# Patient Record
Sex: Female | Born: 1980
Health system: Southern US, Community
[De-identification: ages and names within clinical notes are randomized; demographics above are authoritative.]

## PROBLEM LIST (undated history)

## (undated) ENCOUNTER — Inpatient Hospital Stay (HOSPITAL_COMMUNITY): Payer: Medicaid Other

## (undated) DIAGNOSIS — N883 Incompetence of cervix uteri: Secondary | ICD-10-CM

## (undated) DIAGNOSIS — I82409 Acute embolism and thrombosis of unspecified deep veins of unspecified lower extremity: Secondary | ICD-10-CM

## (undated) DIAGNOSIS — F419 Anxiety disorder, unspecified: Secondary | ICD-10-CM

## (undated) DIAGNOSIS — F329 Major depressive disorder, single episode, unspecified: Secondary | ICD-10-CM

## (undated) DIAGNOSIS — F32A Depression, unspecified: Secondary | ICD-10-CM

## (undated) HISTORY — PX: ABDOMINAL SURGERY: SHX537

## (undated) HISTORY — DX: Incompetence of cervix uteri: N88.3

---

## 2008-07-20 ENCOUNTER — Observation Stay (HOSPITAL_COMMUNITY): Admission: AD | Admit: 2008-07-20 | Discharge: 2008-07-20 | Payer: Self-pay | Admitting: Family Medicine

## 2008-07-20 ENCOUNTER — Encounter: Payer: Self-pay | Admitting: Family Medicine

## 2008-07-20 ENCOUNTER — Ambulatory Visit: Payer: Self-pay | Admitting: Family Medicine

## 2008-08-12 ENCOUNTER — Ambulatory Visit: Payer: Self-pay | Admitting: Obstetrics & Gynecology

## 2008-09-08 ENCOUNTER — Ambulatory Visit: Payer: Self-pay | Admitting: Obstetrics and Gynecology

## 2009-04-15 ENCOUNTER — Emergency Department (HOSPITAL_COMMUNITY): Admission: EM | Admit: 2009-04-15 | Discharge: 2009-04-15 | Payer: Self-pay | Admitting: Family Medicine

## 2009-06-08 ENCOUNTER — Inpatient Hospital Stay (HOSPITAL_COMMUNITY): Admission: AD | Admit: 2009-06-08 | Discharge: 2009-06-08 | Payer: Self-pay | Admitting: Obstetrics & Gynecology

## 2009-07-06 ENCOUNTER — Inpatient Hospital Stay (HOSPITAL_COMMUNITY): Admission: AD | Admit: 2009-07-06 | Discharge: 2009-07-07 | Payer: Self-pay | Admitting: Obstetrics & Gynecology

## 2009-07-08 ENCOUNTER — Inpatient Hospital Stay (HOSPITAL_COMMUNITY): Admission: AD | Admit: 2009-07-08 | Discharge: 2009-07-08 | Payer: Self-pay | Admitting: Family Medicine

## 2009-10-29 DIAGNOSIS — N883 Incompetence of cervix uteri: Secondary | ICD-10-CM | POA: Insufficient documentation

## 2009-10-29 DIAGNOSIS — O3430 Maternal care for cervical incompetence, unspecified trimester: Secondary | ICD-10-CM | POA: Insufficient documentation

## 2009-10-29 HISTORY — DX: Incompetence of cervix uteri: N88.3

## 2009-12-27 ENCOUNTER — Emergency Department (HOSPITAL_COMMUNITY): Admission: EM | Admit: 2009-12-27 | Discharge: 2009-12-27 | Payer: Self-pay | Admitting: Family Medicine

## 2010-01-10 ENCOUNTER — Emergency Department (HOSPITAL_COMMUNITY): Admission: EM | Admit: 2010-01-10 | Discharge: 2010-01-10 | Payer: Self-pay | Admitting: Family Medicine

## 2010-03-31 ENCOUNTER — Emergency Department (HOSPITAL_COMMUNITY): Admission: EM | Admit: 2010-03-31 | Discharge: 2010-03-31 | Payer: Self-pay | Admitting: Family Medicine

## 2010-05-03 ENCOUNTER — Emergency Department (HOSPITAL_COMMUNITY): Admission: EM | Admit: 2010-05-03 | Discharge: 2010-05-03 | Payer: Self-pay | Admitting: Family Medicine

## 2010-07-19 ENCOUNTER — Inpatient Hospital Stay (HOSPITAL_COMMUNITY): Admission: AD | Admit: 2010-07-19 | Discharge: 2010-07-19 | Payer: Self-pay | Admitting: Obstetrics and Gynecology

## 2010-07-25 ENCOUNTER — Inpatient Hospital Stay (HOSPITAL_COMMUNITY): Admission: AD | Admit: 2010-07-25 | Discharge: 2010-07-26 | Payer: Self-pay | Admitting: Obstetrics & Gynecology

## 2010-07-25 ENCOUNTER — Ambulatory Visit: Payer: Self-pay | Admitting: Family Medicine

## 2010-09-20 ENCOUNTER — Encounter: Payer: Self-pay | Admitting: Family Medicine

## 2010-09-20 ENCOUNTER — Ambulatory Visit: Payer: Self-pay | Admitting: Family Medicine

## 2010-09-20 LAB — CONVERTED CEMR LAB
Antibody Screen: NEGATIVE
Basophils Absolute: 0 10*3/uL (ref 0.0–0.1)
Basophils Relative: 0 % (ref 0–1)
Eosinophils Absolute: 0.1 10*3/uL (ref 0.0–0.7)
Eosinophils Relative: 1 % (ref 0–5)
HCT: 35 % — ABNORMAL LOW (ref 36.0–46.0)
HIV: NONREACTIVE
Hemoglobin: 11.7 g/dL — ABNORMAL LOW (ref 12.0–15.0)
Hepatitis B Surface Ag: NEGATIVE
Lymphocytes Relative: 15 % (ref 12–46)
Lymphs Abs: 1.5 10*3/uL (ref 0.7–4.0)
MCHC: 33.4 g/dL (ref 30.0–36.0)
MCV: 92.3 fL (ref 78.0–100.0)
Monocytes Absolute: 0.4 10*3/uL (ref 0.1–1.0)
Monocytes Relative: 4 % (ref 3–12)
Neutro Abs: 8.3 10*3/uL — ABNORMAL HIGH (ref 1.7–7.7)
Neutrophils Relative %: 80 % — ABNORMAL HIGH (ref 43–77)
Platelets: 224 10*3/uL (ref 150–400)
RBC: 3.79 M/uL — ABNORMAL LOW (ref 3.87–5.11)
RDW: 13.6 % (ref 11.5–15.5)
Rh Type: POSITIVE
Rubella: 77.5 intl units/mL — ABNORMAL HIGH
Sickle Cell Screen: NEGATIVE
WBC: 10.3 10*3/uL (ref 4.0–10.5)

## 2010-09-28 ENCOUNTER — Encounter: Payer: Self-pay | Admitting: Family Medicine

## 2010-09-28 ENCOUNTER — Ambulatory Visit: Payer: Self-pay | Admitting: Family Medicine

## 2010-09-28 DIAGNOSIS — N898 Other specified noninflammatory disorders of vagina: Secondary | ICD-10-CM | POA: Insufficient documentation

## 2010-09-28 LAB — CONVERTED CEMR LAB
Chlamydia, DNA Probe: NEGATIVE
GC Probe Amp, Genital: NEGATIVE
Pap Smear: NEGATIVE

## 2010-10-04 ENCOUNTER — Telehealth: Payer: Self-pay | Admitting: *Deleted

## 2010-10-12 ENCOUNTER — Ambulatory Visit: Payer: Self-pay | Admitting: Obstetrics & Gynecology

## 2010-10-16 ENCOUNTER — Inpatient Hospital Stay (HOSPITAL_COMMUNITY)
Admission: RE | Admit: 2010-10-16 | Discharge: 2010-10-17 | Payer: Self-pay | Source: Home / Self Care | Attending: Family Medicine | Admitting: Family Medicine

## 2010-10-16 HISTORY — PX: CERVICAL CERCLAGE: SHX1329

## 2010-10-17 ENCOUNTER — Ambulatory Visit (HOSPITAL_COMMUNITY)
Admission: RE | Admit: 2010-10-17 | Discharge: 2010-10-17 | Payer: Self-pay | Source: Home / Self Care | Attending: Family Medicine | Admitting: Family Medicine

## 2010-10-26 ENCOUNTER — Ambulatory Visit: Payer: Self-pay | Admitting: Obstetrics & Gynecology

## 2010-11-02 ENCOUNTER — Ambulatory Visit
Admission: RE | Admit: 2010-11-02 | Discharge: 2010-11-02 | Payer: Self-pay | Source: Home / Self Care | Attending: Obstetrics and Gynecology | Admitting: Obstetrics and Gynecology

## 2010-11-02 LAB — POCT URINALYSIS DIPSTICK
Bilirubin Urine: NEGATIVE
Hemoglobin, Urine: NEGATIVE
Ketones, ur: NEGATIVE mg/dL
Nitrite: NEGATIVE
Protein, ur: NEGATIVE mg/dL
Specific Gravity, Urine: 1.02 (ref 1.005–1.030)
Urine Glucose, Fasting: NEGATIVE mg/dL
Urobilinogen, UA: 0.2 mg/dL (ref 0.0–1.0)
pH: 6 (ref 5.0–8.0)

## 2010-11-06 ENCOUNTER — Ambulatory Visit (HOSPITAL_COMMUNITY)
Admission: RE | Admit: 2010-11-06 | Discharge: 2010-11-06 | Payer: Self-pay | Source: Home / Self Care | Attending: Obstetrics & Gynecology | Admitting: Obstetrics & Gynecology

## 2010-11-09 ENCOUNTER — Ambulatory Visit: Admit: 2010-11-09 | Payer: Self-pay | Admitting: Obstetrics & Gynecology

## 2010-11-16 ENCOUNTER — Ambulatory Visit
Admission: RE | Admit: 2010-11-16 | Discharge: 2010-11-16 | Payer: Self-pay | Source: Home / Self Care | Attending: Family Medicine | Admitting: Family Medicine

## 2010-11-17 ENCOUNTER — Encounter: Payer: Self-pay | Admitting: Obstetrics & Gynecology

## 2010-11-17 LAB — CONVERTED CEMR LAB
Clue Cells Wet Prep HPF POC: NONE SEEN
Trich, Wet Prep: NONE SEEN
Yeast Wet Prep HPF POC: NONE SEEN

## 2010-11-18 ENCOUNTER — Other Ambulatory Visit: Payer: Self-pay | Admitting: Obstetrics & Gynecology

## 2010-11-18 DIAGNOSIS — O26879 Cervical shortening, unspecified trimester: Secondary | ICD-10-CM

## 2010-11-19 ENCOUNTER — Encounter: Payer: Self-pay | Admitting: Obstetrics & Gynecology

## 2010-11-20 LAB — POCT URINALYSIS DIPSTICK
Bilirubin Urine: NEGATIVE
Hgb urine dipstick: NEGATIVE
Ketones, ur: NEGATIVE mg/dL
Nitrite: NEGATIVE
Protein, ur: NEGATIVE mg/dL
Specific Gravity, Urine: 1.02 (ref 1.005–1.030)
Urine Glucose, Fasting: NEGATIVE mg/dL
Urobilinogen, UA: 0.2 mg/dL (ref 0.0–1.0)
pH: 7 (ref 5.0–8.0)

## 2010-11-28 NOTE — Progress Notes (Signed)
Summary: re: appt at [facility]  Phone Note Other Incoming   Caller: Andreas Blower interpreter Summary of Call: Pt still wating for appt with [facility].  Interpreter calling to find out when that will be.  Please call her at [phone] lv msg if not in. Initial call taken by: Abundio Miu,  October 04, 2010 2:25 PM  Follow-up for Phone Call        called Geanie Cooley and lmvm  PT'S APPT AT [facility] IS 10-12-10 AT 10AM.  Follow-up by: Arlyss Repress CMA,,  October 05, 2010 5:30 PM

## 2010-11-30 ENCOUNTER — Other Ambulatory Visit (HOSPITAL_COMMUNITY): Payer: Self-pay | Admitting: Maternal and Fetal Medicine

## 2010-11-30 ENCOUNTER — Ambulatory Visit: Admit: 2010-11-30 | Payer: Self-pay | Admitting: Obstetrics & Gynecology

## 2010-11-30 ENCOUNTER — Other Ambulatory Visit: Payer: Self-pay | Admitting: Obstetrics & Gynecology

## 2010-11-30 ENCOUNTER — Ambulatory Visit (HOSPITAL_COMMUNITY)
Admission: RE | Admit: 2010-11-30 | Discharge: 2010-11-30 | Disposition: A | Discharge status: Home / Self Care | Payer: Self-pay | Source: Ambulatory Visit | Attending: Obstetrics & Gynecology | Admitting: Obstetrics & Gynecology

## 2010-11-30 ENCOUNTER — Other Ambulatory Visit: Payer: Self-pay

## 2010-11-30 DIAGNOSIS — O26879 Cervical shortening, unspecified trimester: Secondary | ICD-10-CM

## 2010-11-30 DIAGNOSIS — O3430 Maternal care for cervical incompetence, unspecified trimester: Secondary | ICD-10-CM | POA: Insufficient documentation

## 2010-11-30 DIAGNOSIS — O09299 Supervision of pregnancy with other poor reproductive or obstetric history, unspecified trimester: Secondary | ICD-10-CM | POA: Insufficient documentation

## 2010-11-30 NOTE — Assessment & Plan Note (Signed)
Summary: NOB/AAM/DSL   Vital Signs:  Patient profile:   30 year old female Weight:      165 pounds BP sitting:   132 / 68  History of Present Illness: G3P0110 @ 17.2 w/ EDC 03/06/11 new OB visit.  Complaints:  HAs since pregnancy started. Greenish d/c last week Nausea, but vomiting has resolved ?thin discharge, white-ish in color (has h/o IUFD w/ SROM and SAB) NO vag bleeding +SOB with laying, walking, sitting  **Pt has h/o 2nd trimester IUFD and 1st trimester SAB**  Everything was done at [facility].  Summary:  07/20/08: IUFD at 15.5, nml BP at that time (128/83), had p/w vaginal bleeding and umbilical cord was prolapsed through os; pathology showed partial abruption; had h/o LOF x15days before the bleeding.  06/08/09: MAU for abd pain, yolk sac visualized on U/S,  ~5.0 weeks  07/07/09: MAU for vag bleeding, U/S showed yolk sac, couldn't visualize embryo (double blebs sign) or cardiac activity,  ~6.2; Bhcg: 15,098  07/08/09: MAU for repeat quant Bhcg: 15, 017.  Pt diagnosed with completed miscarriage and elected for cytotec over waiting.   Current Problems (verified): 1)  Leukorrhea  (ICD-623.5) 2)  Screening For Malignant Neoplasm of The Cervix  (ICD-V76.2) 3)  Supervision of Normal First Pregnancy  (ICD-V22.0)  Current Medications (verified): 1)  None  Allergies (verified): No Known Drug Allergies  Social History: Occupation:  had been working at [employer]:  some high school Hepatitis Risk:  no Packs/Day:  n/a  Physical Exam  General:  alert, well-developed, well-nourished, and well-hydrated.   Lungs:  normal respiratory effort, normal breath sounds, no crackles, and no wheezes.   Heart:  normal rate, regular rhythm, and no murmur.   Abdomen:  soft and normal bowel sounds.   Genitalia:  no external lesions, mucosa pink and moist, no friaility or hemorrhage, no adnexal masses or tenderness; no vaginal bleeding; thick, white vaginal discharge.     Impression & Recommendations:  Problem # 1:  PREGNANCY, HIGH RISK (ICD-V23.9)  D/w Dr. Mauricio Po.  Will likely need to be transferred to [facility] given the h/o 2nd trimester IUFD followed by SAB @6  weeks.   FHTs were reassuring (147-150). No vaginal bleeding. No pooling of fluid on spec exam. Os appeared closed. White vaginal discharge.  GC/Chlam, Pap, Wet prep done. PNLs WNL.   Orders: Other OB visit- [facility])  Other Orders: GC/Chlamydia-[facility] ([id number]) Pap Smear-[facility] [phone]) Wet Prep- [facility] [phone])   Orders Added: 1)  GC/Chlamydia-[facility] [[id number]] 2)  Pap Smear-[facility] [[id number]] 3)  Wet Prep- [facility] [[id number]] 4)  Other OB visit- [facility] [[facility]]    pt phone # [phone]  Flowsheet View for Follow-up Visit    Estimated weeks of       gestation:     17 2/7    Weight:     165    Blood pressure:   132 / 68    Hx headache?     few    Nausea/vomiting?   nausea    Edema?     0    Bleeding?     no    Leakage/discharge?   d/c    Fetal activity:       N/A    Labor symptoms?   no    FHR:       147    Fetal position:      N/A    Cx dilation:     0    Cx effacement:  0%    Fetal station:     high    Taking Vitamins?   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    Number of children at home: 0  FOB Information    Husband/Father of baby: Wynona Canes     FOB occupation [employer]    FOB Comments: Very excited, trying to get pregnant   Menstrual History    Best Working [facility]: 03/06/2011    Symptoms since LMP: nausea, vomiting, fatigue, irritability, tender breasts    Other symptoms: WHOG b/c of nausea/vomiting, couldn't keep anything down in 1st 3 months, nausea only  now; having bad headache since beginning of pregnancy but not every day ( ~1-2x/week), very intense, last  one yesterday, blurry vision, takes Tylenol, worse with laying down; behind and over eyes and top of head; SOB with laying down; had SOB while cleaning and got dizzy and sat down for a little; SOB with walking and talking sometimes  Prenatal Visit    FOB name: Wynona Canes  [facility] Confirmation:    New working [facility]: 03/06/2011   Past Pregnancy History    Gravida:     3    Term Births:     0    Premature Births:   1    Living Children:   0    Para:       0    Prev C-Section:   0    Aborta:     1    Elect. Ab:     0    Spont. Ab:     1  Pregnancy # 1    Delivery date:     07/20/2008    Weeks Gestation:   15.5    Preterm labor:     yes    Delivery type:     NSVD    Infant Sex:     Female    Comments:     Preterm, 2nd trimester IUFD @ 15.5  Pregnancy # 2    Delivery date:     07/07/2009    Weeks Gestation:   6.2    Preterm labor:     no    Delivery type:     NSVD    Comments:     SAB  Pregnancy # 3    Comments:     Current   Genetic History    Father of baby:   Wynona Canes      Thalassemia:     mother: no   father: no    Neural tube defect:   mother: no   father: no    Down's Syndrome:   mother: no   father: no    Tay-Sachs:     mother: no   father: no    Sickle Cell Dz/Trait:   mother: no   father: no    Hemophilia:     mother: no   father: no    Muscular Dystrophy:   mother: no   father: no    Cystic Fibrosis:   mother: no   father: no    Huntington's Dz:   mother: no   father: no    Mental Retardation:   mother: no   father: no  Fragile X:     mother: no   father: no    Other Genetic or       Chromosomal Dz:   mother: no   father: no    Child with other       birth defect:     mother: no   father: no    > 3 spont. abortions:   mother: no    Hx of stillbirth:     mother: yes  Infection Risk History    High Risk Hepatitis B: no    Immunized against Hepatitis B: no    Exposure to TB: no    Patient with history of Genital Herpes: no    Sexual partner with history of  Genital Herpes: no    History of STD (GC, Chlamydia, Syphilis, HPV): no    Rash, Viral, or Febrile Illness since LMP: no    Exposure to Cat Litter: no    Chicken Pox Immune Status: Non-immune    History of Parvovirus (Fifth Disease): no    Occupational Exposure to Children: none  Environmental Exposures    Xray Exposure since LMP: no    Chemical or other exposure: no    Medication, drug, or alcohol use since LMP: no   Flowsheet View for Follow-up Visit    Estimated weeks of       gestation:     17 2/7    Weight:     165    Blood pressure:   132 / 68    Headache:     few    Nausea/vomiting:   nausea    Edema:     0    Vaginal bleeding:   no    Vaginal discharge:   d/c    FHR:       147    Fetal activity:     N/A    Labor symptoms:   no    Fetal position:     N/A    Cx Dilation:     0    Cx Effacement:   0%    Cx Station:     high    Taking prenatal vits?   Y    Smoking:     n/a   Laboratory Results  Date/Time Received: September 28, 2010 2:47 PM  Date/Time Reported: September 28, 2010 2:53 PM   [employer] Source: vaginal WBC/hpf: 10-20 Bacteria/hpf: 3+  Rods Clue cells/hpf: none  Negative whiff Yeast/hpf: none Trichomonas/hpf: none Comments: 5-10 RBC's also present  ...........test performed by...........Marland KitchenTerese Door, CMA     Appended Document: NOB/AAM/DSL Pt seen at high risk and cerclage placed for h/o cervical insufficency with second trimester loss and current short cervix.  Pt should be followed at high risk.

## 2010-12-04 LAB — POCT URINALYSIS DIPSTICK
Bilirubin Urine: NEGATIVE
Hgb urine dipstick: NEGATIVE
Ketones, ur: NEGATIVE mg/dL
Nitrite: NEGATIVE
Protein, ur: NEGATIVE mg/dL
Specific Gravity, Urine: 1.015 (ref 1.005–1.030)
Urine Glucose, Fasting: NEGATIVE mg/dL
Urobilinogen, UA: 0.2 mg/dL (ref 0.0–1.0)
pH: 6.5 (ref 5.0–8.0)

## 2010-12-14 ENCOUNTER — Other Ambulatory Visit: Payer: Self-pay

## 2010-12-14 ENCOUNTER — Encounter (INDEPENDENT_AMBULATORY_CARE_PROVIDER_SITE_OTHER): Payer: Self-pay | Admitting: *Deleted

## 2010-12-14 DIAGNOSIS — O3430 Maternal care for cervical incompetence, unspecified trimester: Secondary | ICD-10-CM

## 2010-12-14 LAB — CONVERTED CEMR LAB
HIV: NONREACTIVE
MCHC: 33.7 g/dL (ref 30.0–36.0)
Platelets: 188 10*3/uL (ref 150–400)
RDW: 13.5 % (ref 11.5–15.5)

## 2010-12-14 LAB — POCT URINALYSIS DIPSTICK
Bilirubin Urine: NEGATIVE
Hgb urine dipstick: NEGATIVE
Ketones, ur: NEGATIVE mg/dL
Nitrite: NEGATIVE
Protein, ur: NEGATIVE mg/dL
Specific Gravity, Urine: 1.02 (ref 1.005–1.030)
Urine Glucose, Fasting: NEGATIVE mg/dL
Urobilinogen, UA: 0.2 mg/dL (ref 0.0–1.0)
pH: 7 (ref 5.0–8.0)

## 2010-12-15 ENCOUNTER — Encounter (HOSPITAL_COMMUNITY): Payer: Self-pay

## 2010-12-15 ENCOUNTER — Ambulatory Visit (HOSPITAL_COMMUNITY)
Admission: RE | Admit: 2010-12-15 | Discharge: 2010-12-15 | Disposition: A | Discharge status: Home / Self Care | Payer: Self-pay | Source: Ambulatory Visit | Attending: Maternal and Fetal Medicine | Admitting: Maternal and Fetal Medicine

## 2010-12-15 DIAGNOSIS — O09299 Supervision of pregnancy with other poor reproductive or obstetric history, unspecified trimester: Secondary | ICD-10-CM

## 2010-12-22 ENCOUNTER — Encounter: Payer: Self-pay | Admitting: Obstetrics & Gynecology

## 2010-12-22 ENCOUNTER — Other Ambulatory Visit: Payer: Self-pay

## 2010-12-22 DIAGNOSIS — Z0189 Encounter for other specified special examinations: Secondary | ICD-10-CM

## 2010-12-28 ENCOUNTER — Other Ambulatory Visit: Payer: Self-pay

## 2010-12-28 DIAGNOSIS — O3430 Maternal care for cervical incompetence, unspecified trimester: Secondary | ICD-10-CM

## 2010-12-28 LAB — POCT URINALYSIS DIPSTICK
Nitrite: NEGATIVE
Protein, ur: NEGATIVE mg/dL
Specific Gravity, Urine: 1.015 (ref 1.005–1.030)
Urine Glucose, Fasting: NEGATIVE mg/dL
Urobilinogen, UA: 0.2 mg/dL (ref 0.0–1.0)

## 2011-01-04 ENCOUNTER — Other Ambulatory Visit: Payer: Self-pay

## 2011-01-04 ENCOUNTER — Ambulatory Visit (HOSPITAL_COMMUNITY)
Admission: RE | Admit: 2011-01-04 | Discharge: 2011-01-04 | Disposition: A | Discharge status: Home / Self Care | Payer: Self-pay | Source: Ambulatory Visit | Attending: Obstetrics & Gynecology | Admitting: Obstetrics & Gynecology

## 2011-01-04 ENCOUNTER — Other Ambulatory Visit: Payer: Self-pay | Admitting: Obstetrics & Gynecology

## 2011-01-04 DIAGNOSIS — O3430 Maternal care for cervical incompetence, unspecified trimester: Secondary | ICD-10-CM | POA: Insufficient documentation

## 2011-01-04 DIAGNOSIS — O368190 Decreased fetal movements, unspecified trimester, not applicable or unspecified: Secondary | ICD-10-CM

## 2011-01-04 DIAGNOSIS — O09299 Supervision of pregnancy with other poor reproductive or obstetric history, unspecified trimester: Secondary | ICD-10-CM

## 2011-01-04 LAB — POCT URINALYSIS DIPSTICK
Glucose, UA: NEGATIVE mg/dL
Hgb urine dipstick: NEGATIVE
Nitrite: NEGATIVE
Urobilinogen, UA: 0.2 mg/dL (ref 0.0–1.0)

## 2011-01-08 LAB — CBC
HCT: 31.4 % — ABNORMAL LOW (ref 36.0–46.0)
MCHC: 35.4 g/dL (ref 30.0–36.0)
MCV: 89.7 fL (ref 78.0–100.0)
RDW: 13 % (ref 11.5–15.5)

## 2011-01-08 LAB — GLUCOSE, CAPILLARY: Glucose-Capillary: 81 mg/dL (ref 70–99)

## 2011-01-08 LAB — POCT URINALYSIS DIPSTICK
Bilirubin Urine: NEGATIVE
Glucose, UA: NEGATIVE mg/dL
Glucose, UA: NEGATIVE mg/dL
Hgb urine dipstick: NEGATIVE
Ketones, ur: NEGATIVE mg/dL
Nitrite: NEGATIVE
Specific Gravity, Urine: 1.02 (ref 1.005–1.030)
Urobilinogen, UA: 0.2 mg/dL (ref 0.0–1.0)
pH: 7 (ref 5.0–8.0)

## 2011-01-08 LAB — URINALYSIS, ROUTINE W REFLEX MICROSCOPIC
Bilirubin Urine: NEGATIVE
Hgb urine dipstick: NEGATIVE
Protein, ur: NEGATIVE mg/dL
Urobilinogen, UA: 0.2 mg/dL (ref 0.0–1.0)

## 2011-01-11 LAB — URINALYSIS, ROUTINE W REFLEX MICROSCOPIC
Glucose, UA: NEGATIVE mg/dL
Leukocytes, UA: NEGATIVE
Protein, ur: NEGATIVE mg/dL
Specific Gravity, Urine: 1.02 (ref 1.005–1.030)
pH: 6.5 (ref 5.0–8.0)

## 2011-01-11 LAB — WET PREP, GENITAL: Trich, Wet Prep: NONE SEEN

## 2011-01-11 LAB — GC/CHLAMYDIA PROBE AMP, GENITAL: Chlamydia, DNA Probe: NEGATIVE

## 2011-01-11 LAB — URINE MICROSCOPIC-ADD ON

## 2011-01-14 LAB — POCT PREGNANCY, URINE: Preg Test, Ur: NEGATIVE

## 2011-01-18 ENCOUNTER — Encounter: Payer: Self-pay | Admitting: Obstetrics & Gynecology

## 2011-01-18 ENCOUNTER — Other Ambulatory Visit: Payer: Self-pay | Admitting: Obstetrics & Gynecology

## 2011-01-18 DIAGNOSIS — O34519 Maternal care for incarceration of gravid uterus, unspecified trimester: Secondary | ICD-10-CM

## 2011-01-18 DIAGNOSIS — Z331 Pregnant state, incidental: Secondary | ICD-10-CM

## 2011-01-18 DIAGNOSIS — O34539 Maternal care for retroversion of gravid uterus, unspecified trimester: Secondary | ICD-10-CM

## 2011-01-18 LAB — CONVERTED CEMR LAB: Yeast Wet Prep HPF POC: NONE SEEN

## 2011-01-18 LAB — POCT URINALYSIS DIP (DEVICE)
Ketones, ur: NEGATIVE mg/dL
Protein, ur: NEGATIVE mg/dL
Specific Gravity, Urine: 1.02 (ref 1.005–1.030)
Urobilinogen, UA: 0.2 mg/dL (ref 0.0–1.0)
pH: 7 (ref 5.0–8.0)

## 2011-02-01 DIAGNOSIS — O3430 Maternal care for cervical incompetence, unspecified trimester: Secondary | ICD-10-CM

## 2011-02-01 DIAGNOSIS — Z331 Pregnant state, incidental: Secondary | ICD-10-CM

## 2011-02-01 DIAGNOSIS — O368190 Decreased fetal movements, unspecified trimester, not applicable or unspecified: Secondary | ICD-10-CM

## 2011-02-02 LAB — CBC
HCT: 39.7 % (ref 36.0–46.0)
MCV: 96 fL (ref 78.0–100.0)
RBC: 4.13 MIL/uL (ref 3.87–5.11)
WBC: 9.5 10*3/uL (ref 4.0–10.5)

## 2011-02-02 LAB — URINE MICROSCOPIC-ADD ON

## 2011-02-02 LAB — URINALYSIS, ROUTINE W REFLEX MICROSCOPIC
Glucose, UA: NEGATIVE mg/dL
Nitrite: NEGATIVE
Specific Gravity, Urine: 1.01 (ref 1.005–1.030)
pH: 6.5 (ref 5.0–8.0)

## 2011-02-02 LAB — HCG, QUANTITATIVE, PREGNANCY: hCG, Beta Chain, Quant, S: 15017 m[IU]/mL — ABNORMAL HIGH (ref <5)

## 2011-02-03 LAB — CBC
HCT: 36 % (ref 36.0–46.0)
Hemoglobin: 12.7 g/dL (ref 12.0–15.0)
MCV: 94.9 fL (ref 78.0–100.0)
Platelets: 220 10*3/uL (ref 150–400)
RDW: 12.6 % (ref 11.5–15.5)
WBC: 8.8 10*3/uL (ref 4.0–10.5)

## 2011-02-03 LAB — DIFFERENTIAL
Eosinophils Absolute: 0.1 10*3/uL (ref 0.0–0.7)
Eosinophils Relative: 1 % (ref 0–5)
Lymphs Abs: 2.6 10*3/uL (ref 0.7–4.0)
Monocytes Absolute: 0.5 10*3/uL (ref 0.1–1.0)

## 2011-02-03 LAB — WET PREP, GENITAL
Clue Cells Wet Prep HPF POC: NONE SEEN
Trich, Wet Prep: NONE SEEN

## 2011-02-03 LAB — GC/CHLAMYDIA PROBE AMP, GENITAL: GC Probe Amp, Genital: NEGATIVE

## 2011-02-05 LAB — POCT URINALYSIS DIP (DEVICE)
Bilirubin Urine: NEGATIVE
Glucose, UA: NEGATIVE mg/dL
Ketones, ur: NEGATIVE mg/dL
Nitrite: NEGATIVE
pH: 7.5 (ref 5.0–8.0)

## 2011-02-05 LAB — POCT PREGNANCY, URINE: Preg Test, Ur: NEGATIVE

## 2011-02-08 ENCOUNTER — Other Ambulatory Visit: Payer: Self-pay | Admitting: Obstetrics & Gynecology

## 2011-02-08 DIAGNOSIS — Z331 Pregnant state, incidental: Secondary | ICD-10-CM

## 2011-02-08 DIAGNOSIS — O3430 Maternal care for cervical incompetence, unspecified trimester: Secondary | ICD-10-CM

## 2011-02-08 LAB — POCT URINALYSIS DIP (DEVICE)
Bilirubin Urine: NEGATIVE
Glucose, UA: NEGATIVE mg/dL
Ketones, ur: NEGATIVE mg/dL
Nitrite: NEGATIVE

## 2011-02-10 ENCOUNTER — Inpatient Hospital Stay (HOSPITAL_COMMUNITY)
Admission: AD | Admit: 2011-02-10 | Discharge: 2011-02-10 | Disposition: A | Discharge status: Home / Self Care | Payer: Self-pay | Source: Ambulatory Visit | Attending: Obstetrics and Gynecology | Admitting: Obstetrics and Gynecology

## 2011-02-10 DIAGNOSIS — O9989 Other specified diseases and conditions complicating pregnancy, childbirth and the puerperium: Secondary | ICD-10-CM | POA: Insufficient documentation

## 2011-02-15 ENCOUNTER — Other Ambulatory Visit: Payer: Self-pay | Admitting: Obstetrics & Gynecology

## 2011-02-15 DIAGNOSIS — O3430 Maternal care for cervical incompetence, unspecified trimester: Secondary | ICD-10-CM

## 2011-02-15 DIAGNOSIS — O3660X0 Maternal care for excessive fetal growth, unspecified trimester, not applicable or unspecified: Secondary | ICD-10-CM

## 2011-02-15 DIAGNOSIS — Z331 Pregnant state, incidental: Secondary | ICD-10-CM

## 2011-02-15 LAB — POCT URINALYSIS DIP (DEVICE)
Bilirubin Urine: NEGATIVE
Glucose, UA: NEGATIVE mg/dL
Ketones, ur: NEGATIVE mg/dL
Protein, ur: NEGATIVE mg/dL
Specific Gravity, Urine: 1.02 (ref 1.005–1.030)

## 2011-02-15 LAB — GLUCOSE, CAPILLARY: Glucose-Capillary: 91 mg/dL (ref 70–99)

## 2011-02-16 ENCOUNTER — Ambulatory Visit (HOSPITAL_COMMUNITY)
Admission: RE | Admit: 2011-02-16 | Discharge: 2011-02-16 | Disposition: A | Discharge status: Home / Self Care | Payer: Self-pay | Source: Ambulatory Visit | Attending: Obstetrics & Gynecology | Admitting: Obstetrics & Gynecology

## 2011-02-16 DIAGNOSIS — O09299 Supervision of pregnancy with other poor reproductive or obstetric history, unspecified trimester: Secondary | ICD-10-CM | POA: Insufficient documentation

## 2011-02-16 DIAGNOSIS — O3660X0 Maternal care for excessive fetal growth, unspecified trimester, not applicable or unspecified: Secondary | ICD-10-CM | POA: Insufficient documentation

## 2011-02-21 ENCOUNTER — Inpatient Hospital Stay (HOSPITAL_COMMUNITY)
Admission: AD | Admit: 2011-02-21 | Discharge: 2011-02-23 | DRG: 775 | Disposition: A | Discharge status: Home / Self Care | Payer: Medicaid Other | Source: Ambulatory Visit | Attending: Obstetrics & Gynecology | Admitting: Obstetrics & Gynecology

## 2011-02-21 DIAGNOSIS — O3430 Maternal care for cervical incompetence, unspecified trimester: Secondary | ICD-10-CM

## 2011-02-21 LAB — CBC
HCT: 41.2 % (ref 36.0–46.0)
Hemoglobin: 14.2 g/dL (ref 12.0–15.0)
MCHC: 34.5 g/dL (ref 30.0–36.0)
WBC: 11.4 10*3/uL — ABNORMAL HIGH (ref 4.0–10.5)

## 2011-02-22 LAB — CBC
HCT: 34.2 % — ABNORMAL LOW (ref 36.0–46.0)
MCV: 93.2 fL (ref 78.0–100.0)
Platelets: 131 10*3/uL — ABNORMAL LOW (ref 150–400)
RBC: 3.67 MIL/uL — ABNORMAL LOW (ref 3.87–5.11)
RDW: 13.5 % (ref 11.5–15.5)
WBC: 12.4 10*3/uL — ABNORMAL HIGH (ref 4.0–10.5)

## 2011-02-22 LAB — RPR: RPR Ser Ql: NONREACTIVE

## 2011-03-13 NOTE — Discharge Summary (Signed)
NAME:  Hailey Walsh, Hailey Walsh NO.:  [id number]   MEDICAL RECORD NO.:  [id number]          PATIENT TYPE:  INP   LOCATION:  9305                          [facility]ILITY:  [facility]   PHYSICIAN:  Norton Blizzard, MD    DATE OF BIRTH:  1981/09/05   DATE OF ADMISSION:  07/20/2008  DATE OF DISCHARGE:  07/20/2008                               DISCHARGE SUMMARY   HISTORY OF PRESENT ILLNESS:  Hailey Walsh is a 30 year old G1,  now P0-0-1-0 who presented this morning to the Maternity Admission Unit  with complaints of vaginal bleeding.  History is positive for leaking  fluid for the past 15 days.  She is approximately 15 weeks' pregnant.  In the Maternity Admission Unit, she was noted to have a nonviable fetus  on ultrasound with small-to-moderate amount of blood in the vagina.  This was all explained to the patient and it was recommended that she be  admitted for induction of labor secondary to IUFD; however, the patient  wound up  going into spontaneous labor and had a spontaneous vaginal  delivery of a nonviable fetus around 1430.  The placenta was delivered 2  hours later intact by Dr. Shawnie Pons after 800 mcg of Cytotec placed PR and  20 units of Pitocin injected into the umbilical vein.  No lacerations  were noted and bleeding was considered normal.  H&H 2 hours later was  10.6 and 30.3.  At this time, the patient has normal amounts of lochia  with a firm fundus at E-4.  She has been given instructions to have a  followup appointment at the [facility] Department in 2  weeks.  She has been visited by the hospital chaplain and grief  counseling has been offered to the patient through [facility].   DISCHARGE MEDICATIONS:  Motrin 600 mg 1 p.o. q.6 h. as needed for pain  and FeSO4 325 mg 1 p.o. b.i.d. for 1 month for postpartum anemia.  She is to be on pelvic rest for 4 weeks.      Jacklyn Shell, C.N.M.      Norton Blizzard, MD  Electronically  Signed    FC/MEDQ  D:  07/20/2008  T:  07/21/2008  Job:  [id number]

## 2011-03-13 NOTE — Group Therapy Note (Signed)
NAME:  Hailey Walsh, Hailey Walsh NO.:  [id number]   MEDICAL RECORD NO.:  [id number]          PATIENT TYPE:  WOC   LOCATION:  [facility]                   FACILITY:  [facility]   PHYSICIAN:  Argentina Donovan, MD        DATE OF BIRTH:  September 01, 1981   DATE OF SERVICE:  09/08/2008                                  CLINIC NOTE   The patient is a 30 year old gravida 1, para 0-0-1-0, who had a 15-week  fetal demise and we had looked in pathology, as if she had a partial  abruption on July 20, 2008, came in 3 weeks ago for a followup and  was placed on Wellbutrin because of some postpartum depression that she  had been complaining of.  She comes in today for further followup and  she is doing very well.  She is planning on waiting several months to  get pregnant.  We have encouraged her to continue on prenatal vitamins  with folic acid both prior to during her next conception and she seems  to want to stay on the Wellbutrin for a while.  She is going to wean  herself off before she gets pregnant and I have described to her how to  do that.           ______________________________  Argentina Donovan, MD     PR/MEDQ  D:  09/08/2008  T:  09/09/2008  Job:  [id number]

## 2011-04-02 ENCOUNTER — Other Ambulatory Visit: Payer: Self-pay | Admitting: Obstetrics & Gynecology

## 2011-04-02 ENCOUNTER — Ambulatory Visit (INDEPENDENT_AMBULATORY_CARE_PROVIDER_SITE_OTHER): Payer: Medicaid Other | Admitting: Obstetrics & Gynecology

## 2011-04-02 DIAGNOSIS — R102 Pelvic and perineal pain: Secondary | ICD-10-CM

## 2011-04-02 DIAGNOSIS — N949 Unspecified condition associated with female genital organs and menstrual cycle: Secondary | ICD-10-CM

## 2011-04-03 NOTE — Group Therapy Note (Signed)
NAME:  MERVE, HOTARD NO.:  [id number]  MEDICAL RECORD NO.:  [id number]           PATIENT TYPE:  A  LOCATION:  [facility]                   FACILITY:  [facility]  PHYSICIAN:  Elsie Lincoln, MD      DATE OF BIRTH:  1981/06/06  DATE OF SERVICE:  04/02/2011                                 CLINIC NOTE  The patient is a 30 year old female G3, para 1-1-1-2, who presents for her postpartum exam.  The patient has been having pain in her introitus and also some right lower quadrant pain that is radiating to her back. The patient is using condoms for birth control and does not want to try anything else.  She took one time to get pregnant and she would not like to do any hormonal intervention in case she would like to be pregnant again.  The patient has a history of incompetent cervix and had a cerclage this pregnancy and delivered at 38 weeks, place cerclage next pregnancy.  The patient had the first 3 lacerations that she said were repaired, I do not have records of that right now.  The patient is not having any depression, Edinburgh scale is 1.  The patient is pumping as the baby is not latching well.  Her last Pap smear was in December 2011, and normal.  PHYSICAL EXAMINATION:  VITAL SIGNS:  Temperature 98.9, pulse 101, blood pressure 117/75, weight 167, height 61 inches. GENERAL:  Well nourished, well developed, no apparent distress. HEENT:  Normocephalic, atraumatic. NECK:  Thyroid, no masses. LUNGS:  Clear to auscultation bilaterally. HEART:  Regular rate and rhythm. ABDOMEN:  Soft, nontender.  No organomegaly, no hernia, no pain in any quadrant. GENITALIA:  Tanner V.  The patient is very tender at 6 o'clock on the introitus.  There is a little bit scar tissue there.  Vagina is pink, normal rugae.  Cervix is closed, nontender.  Uterus anteverted, nontender.  Adnexa; right adnexa is full and is tender on exam, left adnexa normal. EXTREMITIES:  No edema and  nontender.  ASSESSMENT AND PLAN:  A 30 year old female with postpartum examination. 1. No evidence of depression. 2. No evidence of mastitis. 3. Perineal pain.  The patient is to come back in 3 weeks to see if it     is better.  Suggest pelvic rest. 4. Ultrasound of right adnexa to evaluate fullness and pain.          ______________________________ Elsie Lincoln, MD    KL/MEDQ  D:  04/02/2011  T:  04/03/2011  Job:  [id number]

## 2011-04-06 ENCOUNTER — Ambulatory Visit (HOSPITAL_COMMUNITY)
Admission: RE | Admit: 2011-04-06 | Discharge: 2011-04-06 | Disposition: A | Discharge status: Home / Self Care | Payer: Self-pay | Source: Ambulatory Visit | Attending: Obstetrics & Gynecology | Admitting: Obstetrics & Gynecology

## 2011-04-06 ENCOUNTER — Ambulatory Visit (HOSPITAL_COMMUNITY): Payer: Self-pay

## 2011-04-06 DIAGNOSIS — N949 Unspecified condition associated with female genital organs and menstrual cycle: Secondary | ICD-10-CM | POA: Insufficient documentation

## 2011-04-06 DIAGNOSIS — D259 Leiomyoma of uterus, unspecified: Secondary | ICD-10-CM | POA: Insufficient documentation

## 2011-04-06 DIAGNOSIS — R102 Pelvic and perineal pain: Secondary | ICD-10-CM

## 2011-05-03 ENCOUNTER — Ambulatory Visit: Payer: Medicaid Other | Admitting: Family Medicine

## 2011-06-07 ENCOUNTER — Encounter: Payer: Self-pay | Admitting: Family Medicine

## 2011-06-07 ENCOUNTER — Ambulatory Visit (INDEPENDENT_AMBULATORY_CARE_PROVIDER_SITE_OTHER): Payer: Self-pay | Admitting: Family Medicine

## 2011-06-07 VITALS — BP 114/82 | HR 72 | Temp 99.5°F | Ht 61.0 in | Wt 168.5 lb

## 2011-06-07 DIAGNOSIS — O26899 Other specified pregnancy related conditions, unspecified trimester: Secondary | ICD-10-CM

## 2011-06-07 DIAGNOSIS — O9089 Other complications of the puerperium, not elsewhere classified: Secondary | ICD-10-CM

## 2011-06-07 DIAGNOSIS — R102 Pelvic and perineal pain: Secondary | ICD-10-CM | POA: Insufficient documentation

## 2011-06-07 MED ORDER — LIDOCAINE HCL 2 % EX GEL: CUTANEOUS | Status: AC | PRN

## 2011-06-07 NOTE — Progress Notes (Addendum)
History Here for f/u after a pp check for perineal pain.  Pain is after menses and during intercourse.  Started after infant delivered.  Had tears which are repaired and healing well. Also, pt. Had fullness in right adnexa on pp exam and underwent pelvic sonogram which is reviewed today.    Physical exam Scarring noted at sub-urethra and 7 at introitus.  Is tender to palpation.  Vagina is ruggated and pink.  Assessment Perineal pain from delivery likely related to suture and healing. Nml sonogram, with no evidence of adnexal abnormality.  Plan Lidocaine gel and perineal massage.

## 2011-07-30 LAB — ABO/RH: ABO/RH(D): O POS

## 2011-07-30 LAB — DIFFERENTIAL
Basophils Absolute: 0
Basophils Relative: 0
Eosinophils Absolute: 0
Eosinophils Relative: 0
Lymphs Abs: 1.1
Neutrophils Relative %: 83 — ABNORMAL HIGH

## 2011-07-30 LAB — CBC
HCT: 30.3 — ABNORMAL LOW
MCV: 94.4
Platelets: 193
Platelets: 213
RDW: 12.3
RDW: 12.4
WBC: 13.1 — ABNORMAL HIGH
WBC: 9.5

## 2011-07-30 LAB — GC/CHLAMYDIA PROBE AMP, GENITAL
Chlamydia, DNA Probe: NEGATIVE
GC Probe Amp, Genital: NEGATIVE

## 2011-09-28 ENCOUNTER — Encounter (HOSPITAL_COMMUNITY): Payer: Self-pay

## 2011-09-28 ENCOUNTER — Emergency Department (INDEPENDENT_AMBULATORY_CARE_PROVIDER_SITE_OTHER)
Admission: EM | Admit: 2011-09-28 | Discharge: 2011-09-28 | Disposition: A | Discharge status: Home / Self Care | Payer: Self-pay | Source: Home / Self Care | Attending: Family Medicine | Admitting: Family Medicine

## 2011-09-28 DIAGNOSIS — M461 Sacroiliitis, not elsewhere classified: Secondary | ICD-10-CM

## 2011-09-28 DIAGNOSIS — M549 Dorsalgia, unspecified: Secondary | ICD-10-CM

## 2011-09-28 LAB — POCT URINALYSIS DIP (DEVICE)
Bilirubin Urine: NEGATIVE
Glucose, UA: NEGATIVE mg/dL
Ketones, ur: NEGATIVE mg/dL
Specific Gravity, Urine: 1.02 (ref 1.005–1.030)

## 2011-09-28 LAB — WET PREP, GENITAL
Trich, Wet Prep: NONE SEEN
Yeast Wet Prep HPF POC: NONE SEEN

## 2011-09-28 LAB — POCT PREGNANCY, URINE: Preg Test, Ur: NEGATIVE

## 2011-09-28 MED ORDER — KETOROLAC TROMETHAMINE 60 MG/2ML IM SOLN
60.0000 mg | Freq: Once | INTRAMUSCULAR | Status: AC
  Administered 2011-09-28: 60 mg via INTRAMUSCULAR

## 2011-09-28 MED ORDER — KETOROLAC TROMETHAMINE 60 MG/2ML IM SOLN
INTRAMUSCULAR | Status: AC
  Filled 2011-09-28: qty 2

## 2011-09-28 MED ORDER — HYDROCODONE-ACETAMINOPHEN 5-325 MG PO TABS: ORAL_TABLET | ORAL | Status: AC

## 2011-09-28 MED ORDER — NAPROXEN 375 MG PO TABS: 375.0000 mg | ORAL_TABLET | Freq: Two times a day (BID) | ORAL | Status: AC

## 2011-09-28 NOTE — ED Notes (Signed)
Pt c/o lt lower back pain that radiates to LLQ abdomen for 3 days. C/o brownish vaginal discharge, also states that after she urinates she feels like she needs to go again. No new partners, just her husband.

## 2011-09-28 NOTE — ED Provider Notes (Signed)
History     CSN: [id number] Arrival date & time: 09/28/2011 11:31 AM   First MD Initiated Contact with Patient 09/28/11 1045      Chief Complaint  Patient presents with  . Abdominal Pain  . Back Pain    (Consider location/radiation/quality/duration/timing/severity/associated sxs/prior treatment) HPI Comments: Hailey Walsh presents for evaluation of LEFT lower back pain that radiates to her LEFT lower abdomen. She reports onset over the last few days. She also reports brownish vaginal discharge with urinary frequency and urgency but no dysuria. She is monogamous with her husband and they do not use condoms.   Patient is a 30 y.o. female presenting with back pain. The history is provided by the patient.  Back Pain  This is a new problem. The problem occurs constantly. The problem has not changed since onset.The pain is associated with no known injury. The pain is present in the lumbar spine. The quality of the pain is described as cramping and aching. The pain is moderate. The symptoms are aggravated by bending and certain positions. The pain is the same all the time. Stiffness is present all day. Associated symptoms include abdominal pain and pelvic pain. Pertinent negatives include no bowel incontinence, no bladder incontinence, no dysuria, no paresthesias, no paresis, no tingling and no weakness. She has tried nothing for the symptoms.    Past Medical History  Diagnosis Date  . Incompetent cervix 2011    Past Surgical History  Procedure Date  . Cervical cerclage 10/16/2010    No family history on file.  History  Substance Use Topics  . Smoking status: Never Smoker   . Smokeless tobacco: Not on file  . Alcohol Use: No    OB History    Grav Para Term Preterm Abortions TAB SAB Ect Mult Living   3 1 1  2   0         Review of Systems  Constitutional: Negative.   HENT: Negative.   Eyes: Negative.   Respiratory: Negative.   Cardiovascular: Negative.   Gastrointestinal:  Positive for abdominal pain. Negative for bowel incontinence.  Genitourinary: Positive for flank pain, vaginal discharge and pelvic pain. Negative for bladder incontinence, dysuria, hematuria, decreased urine volume, vaginal bleeding, difficulty urinating and genital sores.  Musculoskeletal: Positive for back pain.  Neurological: Negative.  Negative for tingling, weakness and paresthesias.    Allergies  Review of patient's allergies indicates no known allergies.  Home Medications   Current Outpatient Rx  Name Route Sig Dispense Refill  . TYLENOL PO Oral Take by mouth. 2 ES tablets last pm     . NATALCARE PIC 60-1 MG PO TABS Oral Take 1 tablet by mouth daily.      . ACETAMINOPHEN 325 MG PO TABS Oral Take 650 mg by mouth every 4 (four) hours as needed.      Marland Kitchen LIDOCAINE HCL 2 % EX GEL Topical Apply topically as needed. 30 mL 3    BP 116/72  Pulse 76  Temp(Src) 98.1 F (36.7 C) (Oral)  Resp 20  SpO2 100%  LMP 09/01/2011  Breastfeeding? No  Physical Exam  Nursing note and vitals reviewed. Constitutional: She is oriented to person, place, and time. She appears well-developed and well-nourished.  HENT:  Head: Normocephalic and atraumatic.  Eyes: EOM are normal.  Neck: Normal range of motion.  Pulmonary/Chest: Effort normal.  Abdominal: Soft. Normal appearance. There is tenderness in the left lower quadrant. There is no tenderness at McBurney's point and negative Murphy's sign.  Genitourinary:  Uterus normal. There is no rash, tenderness or lesion on the right labia. There is no rash, tenderness or lesion on the left labia. Cervix exhibits no discharge. Right adnexum displays no mass, no tenderness and no fullness. Left adnexum displays no mass, no tenderness and no fullness. No erythema, tenderness or bleeding around the vagina. Vaginal discharge found.  Musculoskeletal: Normal range of motion.       Lumbar back: She exhibits tenderness and pain.       Back:  Neurological: She is  alert and oriented to person, place, and time.  Skin: Skin is warm and dry.  Psychiatric: Her behavior is normal.    ED Course  Procedures (including critical care time)  Labs Reviewed  POCT URINALYSIS DIP (DEVICE) - Abnormal; Notable for the following:    Hgb urine dipstick TRACE (*)    Leukocytes, UA TRACE (*) Biochemical Testing Only. Please order routine urinalysis from main lab if confirmatory testing is needed.   All other components within normal limits  POCT PREGNANCY, URINE  POCT URINALYSIS DIPSTICK  POCT PREGNANCY, URINE   No results found.   No diagnosis found.    MDM          Richardo Priest, MD 10/02/11 [phone]

## 2011-09-28 NOTE — ED Notes (Signed)
Triage information obtained via [employer] on phone line.  Pt instructed what to expect.

## 2011-09-29 LAB — GC/CHLAMYDIA PROBE AMP, GENITAL
Chlamydia, DNA Probe: NEGATIVE
GC Probe Amp, Genital: NEGATIVE

## 2011-12-17 ENCOUNTER — Emergency Department (HOSPITAL_COMMUNITY)
Admission: EM | Admit: 2011-12-17 | Discharge: 2011-12-17 | Disposition: A | Discharge status: Home / Self Care | Payer: Self-pay | Source: Home / Self Care | Attending: Emergency Medicine | Admitting: Emergency Medicine

## 2011-12-17 ENCOUNTER — Encounter (HOSPITAL_COMMUNITY): Payer: Self-pay | Admitting: *Deleted

## 2011-12-17 DIAGNOSIS — J40 Bronchitis, not specified as acute or chronic: Secondary | ICD-10-CM

## 2011-12-17 MED ORDER — ALBUTEROL SULFATE HFA 108 (90 BASE) MCG/ACT IN AERS: 1.0000 | INHALATION_SPRAY | Freq: Four times a day (QID) | RESPIRATORY_TRACT | Status: DC | PRN

## 2011-12-17 MED ORDER — AMOXICILLIN-POT CLAVULANATE 500-125 MG PO TABS: 1.0000 | ORAL_TABLET | Freq: Two times a day (BID) | ORAL | Status: AC

## 2011-12-17 MED ORDER — GUAIFENESIN-CODEINE 100-10 MG/5ML PO SYRP: 5.0000 mL | ORAL_SOLUTION | Freq: Three times a day (TID) | ORAL | Status: AC | PRN

## 2011-12-17 NOTE — Discharge Instructions (Signed)
Bronquitis (Bronchitis) La bronquitis es [place] enfermedad de los conductos que transportan el aire a los pulmones. Esto dificulta la entrada y salida del aire. El Scientist, research (medical) de los conductos puede causar mucha tos. No recibir el tratamiento adecuado puede causar una bronquitis de larga duracin (crnica). CUIDADOS EN EL HOGAR  Tome mucha agua para mantener la orina clara o de color amarillo claro.   Use un humidificador de vapor fro.   Si fuma, deje de fumar. Si sigue fumando, impedir la curacin de la bronquitis.   Tome los medicamentos segn las indicaciones de su mdico.  SOLICITE AYUDA DE INMEDIATO SI:  La tos lo despierta por las noches.   Respira con dificultad.   Usted o su nio se vuelve dbil o ms enfermo.   Presenta dificultades para respirar, tiene sibilancias o Company secretary.   Tose y escupe sangre.   La tos dura ms de 2 semanas.   Tiene fiebre.   Su beb tiene ms de 3 meses y su temperatura rectal es de 102 F (38.9 C) o ms.   Su beb tiene 3 meses o menos y su temperatura rectal es de 100.4 F (38 C) o ms.  ASEGURESE DE:  Comprende estas instrucciones.   Controlar su enfermedad.   Solicitar ayuda de inmediato si no mejora o empeora.  Document Released: 01/11/2009 Document Revised: 06/27/2011 [facility] Patient Information 2012 [place], [place].Bronquitis (Bronchitis) La bronquitis es [place] enfermedad de los conductos que transportan el aire a los pulmones. Esto dificulta la entrada y salida del aire. El Scientist, research (medical) de los conductos puede causar mucha tos. No recibir el tratamiento adecuado puede causar una bronquitis de larga duracin (crnica). CUIDADOS EN EL HOGAR  Tome mucha agua para mantener la orina clara o de color amarillo claro.   Use un humidificador de vapor fro.   Si fuma, deje de fumar. Si sigue fumando, impedir la curacin de la bronquitis.   Tome los medicamentos segn las indicaciones de su mdico.  SOLICITE AYUDA DE  INMEDIATO SI:  La tos lo despierta por las noches.   Respira con dificultad.   Usted o su nio se vuelve dbil o ms enfermo.   Presenta dificultades para respirar, tiene sibilancias o Company secretary.   Tose y escupe sangre.   La tos dura ms de 2 semanas.   Tiene fiebre.   Su beb tiene ms de 3 meses y su temperatura rectal es de 102 F (38.9 C) o ms.   Su beb tiene 3 meses o menos y su temperatura rectal es de 100.4 F (38 C) o ms.  ASEGURESE DE:  Comprende estas instrucciones.   Controlar su enfermedad.   Solicitar ayuda de inmediato si no mejora o empeora.  Document Released: 01/11/2009 Document Revised: 06/27/2011 [facility] Patient Information 2012 [place], [place].

## 2011-12-17 NOTE — ED Notes (Signed)
Pt is here with complaints of 15 day history of nasal congestion, cough with green phlegm, left ear pain, and HA.  Denies fever.

## 2011-12-17 NOTE — ED Provider Notes (Signed)
History     CSN: [id number]  Arrival date & time 12/17/11  1016   First MD Initiated Contact with Patient 12/17/11 1117      Chief Complaint  Patient presents with  . Nasal Congestion  . Headache  . Cough  . Otalgia    (Consider location/radiation/quality/duration/timing/severity/associated sxs/prior treatment) HPI Comments: Patient presents with respiratory symptoms consistent of cough, nasal congestion, sore throat, recurrent headaches, had fevers during the first week. Cough continues has progressed to a productive cough now with yellowish to green phlegm. Patient also feels she's not breathing well and complains of left-sided ear pain. Have not had a fever during the course of this week "feel my chest is congested".  Patient is a 31 y.o. female presenting with headaches, cough, and ear pain. The history is provided by the patient. No language interpreter was used.  Headache The primary symptoms include headaches and fever. Primary symptoms do not include loss of consciousness. The symptoms began more than 1 week ago. The symptoms are unchanged.  Cough Associated symptoms include ear pain and headaches. Pertinent negatives include no rhinorrhea.  Otalgia Associated symptoms include headaches and cough. Pertinent negatives include no rhinorrhea, no neck pain and no rash.    Past Medical History  Diagnosis Date  . Incompetent cervix 2011    Past Surgical History  Procedure Date  . Cervical cerclage 10/16/2010    History reviewed. No pertinent family history.  History  Substance Use Topics  . Smoking status: Never Smoker   . Smokeless tobacco: Not on file  . Alcohol Use: No    OB History    Grav Para Term Preterm Abortions TAB SAB Ect Mult Living   3 1 1  2   0         Review of Systems  Constitutional: Positive for fever, activity change, appetite change and fatigue.  HENT: Positive for ear pain and congestion. Negative for rhinorrhea and neck pain.     Respiratory: Positive for cough.   Skin: Negative for rash.  Neurological: Positive for headaches. Negative for loss of consciousness.    Allergies  Review of patient's allergies indicates no known allergies.  Home Medications   Current Outpatient Rx  Name Route Sig Dispense Refill  . TYLENOL PO Oral Take by mouth. 2 ES tablets last pm     . ACETAMINOPHEN 325 MG PO TABS Oral Take 650 mg by mouth every 4 (four) hours as needed.      . ALBUTEROL SULFATE HFA 108 (90 BASE) MCG/ACT IN AERS Inhalation Inhale 1-2 puffs into the lungs every 6 (six) hours as needed for wheezing. 1 Inhaler 0  . AMOXICILLIN-POT CLAVULANATE 500-125 MG PO TABS Oral Take 1 tablet (500 mg total) by mouth 2 (two) times daily. 20 tablet 0  . GUAIFENESIN-CODEINE 100-10 MG/5ML PO SYRP Oral Take 5 mLs by mouth 3 (three) times daily as needed for cough. 120 mL 0  . LIDOCAINE HCL 2 % EX GEL Topical Apply topically as needed. 30 mL 3  . NAPROXEN 375 MG PO TABS Oral Take 1 tablet (375 mg total) by mouth 2 (two) times daily. 20 tablet 0  . NATALCARE PIC 60-1 MG PO TABS Oral Take 1 tablet by mouth daily.        BP 120/74  Pulse 84  Temp(Src) 99.1 F (37.3 C) (Oral)  Resp 16  SpO2 100%  LMP 11/08/2011  Physical Exam  Nursing note and vitals reviewed. Constitutional: Vital signs are normal. She appears well-nourished.  Non-toxic appearance. She does not have a sickly appearance. She does not appear ill. No distress.  Eyes: Conjunctivae are normal.  Neck: Neck supple. No JVD present.  Pulmonary/Chest: Effort normal. No respiratory distress. She has no decreased breath sounds. She has no wheezes. She has no rhonchi. She has no rales.  Abdominal: Soft.  Lymphadenopathy:    She has no cervical adenopathy.  Skin: Diaphoretic: and. No erythema.    ED Course  Procedures (including critical care time)  Labs Reviewed - No data to display No results found.   1. Bronchitis       MDM  Respiratory symptoms for 2  weeks. Productive cough afebrile no respiratory distress. Exam and symptoms were consistent with bronchitis. Patient advised to return if no improvement after 3-5 days of treatment or any other change in her symptomatology. As specifically shortness of breath or fevers.        Jimmie Molly, MD 12/17/11 [phone]

## 2014-01-21 ENCOUNTER — Ambulatory Visit: Payer: No Typology Code available for payment source | Attending: Family Medicine | Admitting: Family Medicine

## 2014-01-21 ENCOUNTER — Other Ambulatory Visit (HOSPITAL_COMMUNITY)
Admission: RE | Admit: 2014-01-21 | Discharge: 2014-01-21 | Disposition: A | Discharge status: Home / Self Care | Payer: No Typology Code available for payment source | Source: Ambulatory Visit | Attending: Family Medicine | Admitting: Family Medicine

## 2014-01-21 ENCOUNTER — Encounter: Payer: Self-pay | Admitting: Family Medicine

## 2014-01-21 VITALS — BP 109/79 | HR 80 | Temp 98.4°F | Resp 20 | Ht 61.0 in | Wt 162.4 lb

## 2014-01-21 DIAGNOSIS — N760 Acute vaginitis: Secondary | ICD-10-CM | POA: Insufficient documentation

## 2014-01-21 DIAGNOSIS — N898 Other specified noninflammatory disorders of vagina: Secondary | ICD-10-CM

## 2014-01-21 DIAGNOSIS — L918 Other hypertrophic disorders of the skin: Secondary | ICD-10-CM

## 2014-01-21 NOTE — Patient Instructions (Signed)
Ejercicios de Kegel   (Kegel Exercises)  El objetivo de los ejercicios de Kegel es aislar y ejercitar los músculos del suelo pélvico. Estos músculos actúan como una hamaca que soporta el recto, la vagina, el intestino delgado y el útero. A medida que los músculos se debilitan, se hunden y esos órganos son desplazados de sus posiciones normales. Los ejercicios de Kegel fortalecen los músculos del suelo pélvico y ayudan a mejorar el control de la vejiga y del intestino, mejoran la respuesta sexual y ayudan a solucionar muchos problemas y algunas molestias durante el embarazo. Se pueden hacer en cualquier lugar y en cualquier momento.   CÓMO REALIZAR LOS EJERCICIOS DE KEGEL   1. Localice los músculos del suelo pélvico. Para ello apriete (contraiga) los músculos que se utilizan para tratar de detener el flujo de orina. Usted se sentirá una opresión en el área vaginal (mujeres) y que se eleva y comprime la zona rectal (hombres y mujeres).  2. Para comenzar, contraiga los músculos pélvicos durante 2 a 5 segundos y después relájelos durante 2 a 5 segundos. Esta es una serie. Repita 4 a 5 series con una breve pausa en el medio.  3. Contraiga los músculos de la pelvis durante 8 a 10 segundos y después relájelos durante 8 a 10 segundos. Repita 4 a 5 series. Si no puede contraer los músculos de la pelvis durante 8 a 10 segundos, trate de 5 a 7 segundos y avance hasta lograr 8 a 10 segundos. Su objetivo es realizar 4 a 5 series de 10 contracciones cada día.  Mantenga el estómago, las nalgas y las piernas relajadas durante los ejercicios. Realice ambas series de contracciones, cortas y largas. Varíe las posiciones. Haga las contracciones 3 a 4 veces por día. Haga las series mientras está:     · Acostado en la cama por la mañana.  · De pie en el almuerzo.  · Sentado por las tardes.  · Acostado en la cama por la noche.    Debe hacer 40 a 50 contracciones por día. No realice más ejercicios de Kegel por día que lo recomendado. El  exceso de ejercicio puede causar fatiga muscular. Continúe con estos ejercicios durante al menos 15 a 20 semanas o según las indicaciones de su médico.   Document Released: 10/01/2012  ExitCare® Patient Information ©2014 [employer].

## 2014-01-21 NOTE — Progress Notes (Unsigned)
Patient is here to establish care. Last pap smear around a year ago. Patient has never had an abnormal pap smear. Patient on birth control pill but is unsure of name of medication. Her last menstrual period was around a year ago. No vaginal/abdominal pain but clear discharge. Patient complaining of fatigue and numerous skin tags on eyelids and chest. Patient has been removing them herself. Spanish interpreter present

## 2014-01-21 NOTE — Progress Notes (Unsigned)
   Subjective:    Patient ID: Hailey Walsh, female    DOB: 1981/09/13, 33 y.o.   MRN: [id number]  Gynecologic Exam   Patient is here to establish care. She believes that she needs a Pap smear today. She is also concerned about her skin tags.  She had a Pap one year ago and had them annually prior to a year ago. She has never had any abnormal Paps. She denies any pelvic pain, abnormal bleeding. She does have discharged daily that is clear and very liquidy. She is not sure if this is vaginal discharge or a little bit of leaking urine. She has been pregnant 3 times. She has one child. She had a four-month loss and a six-month loss pregnancies which have been attributed to incompetent cervix.  She has numerous tiny skin tags on her chest, neck, and a few on her face. She has been removing them herself with clean sharp scissors. She does not believe that she had a T. dAP shot during pregnancy and does not know when her last tetanus shot was.  Review of Systems A 12 point review of systems is negative except as per hpi.       Objective:   Physical Exam  Genitourinary: Pelvic exam was performed with patient prone. There is no rash or tenderness on the right labia. There is no rash or tenderness on the left labia. Vaginal discharge found.  Mild cystocele. D/c appears physiologic.     Nursing note and vitals reviewed. Constitutional: She is oriented to person, place, and time. She appears well-developed and well-nourished.  HENT:  Neck: Normal range of motion. Neck supple. No thyromegaly present.  Cardiovascular: Normal rate, regular rhythm and normal heart sounds.   Pulmonary/Chest: Effort normal and breath sounds normal.  Abdominal: Soft. Bowel sounds are normal. She exhibits no distension. There is no tenderness. There is no rebound.  Lymphadenopathy:    She has no cervical adenopathy.  Neurological: She is alert and oriented to person, place, and time. She has normal reflexes.    Skin: Skin is warm and dry.  Psychiatric: She has a normal mood and affect. Her behavior is normal.        Assessment & Plan:  Shannelle was seen today for establish care and gynecologic exam.  Diagnoses and associated orders for this visit:  Vaginal discharge - wet prep sent Suspect d/c is urine. kegels given. Discussed there is a surgical procedure (sling) but not without risks so must give kegels a try.  Skin tag - no skin tags ar ein area that is causing a problem, more cosmetic issue. For this reason we are unable to remove them in clinic (unless cash pay). Discussed that if she removes them herlsef (which i do not recommend but she says she is going to do anyway), she needs to thorougkly clean the area first, use a clean sterile blade, and keep the area clean afetrwords and watch for si/sx infection. Giving tdap today.   Pap smear will be due in 2 years.  rtc 1-2 mo cpe

## 2014-01-22 ENCOUNTER — Other Ambulatory Visit: Payer: Self-pay

## 2014-01-22 DIAGNOSIS — Z0000 Encounter for general adult medical examination without abnormal findings: Secondary | ICD-10-CM

## 2014-01-22 LAB — CERVICOVAGINAL ANCILLARY ONLY
WET PREP (BD AFFIRM): NEGATIVE
WET PREP (BD AFFIRM): NEGATIVE
Wet Prep (BD Affirm): NEGATIVE

## 2014-02-17 ENCOUNTER — Ambulatory Visit: Payer: No Typology Code available for payment source | Admitting: Internal Medicine

## 2014-04-26 ENCOUNTER — Emergency Department (INDEPENDENT_AMBULATORY_CARE_PROVIDER_SITE_OTHER): Payer: No Typology Code available for payment source

## 2014-04-26 ENCOUNTER — Encounter (HOSPITAL_COMMUNITY): Payer: Self-pay | Admitting: Emergency Medicine

## 2014-04-26 ENCOUNTER — Emergency Department (HOSPITAL_COMMUNITY)
Admission: EM | Admit: 2014-04-26 | Discharge: 2014-04-26 | Disposition: A | Discharge status: Home / Self Care | Payer: No Typology Code available for payment source | Source: Home / Self Care | Attending: Emergency Medicine | Admitting: Emergency Medicine

## 2014-04-26 DIAGNOSIS — S8251XA Displaced fracture of medial malleolus of right tibia, initial encounter for closed fracture: Secondary | ICD-10-CM

## 2014-04-26 DIAGNOSIS — S8253XA Displaced fracture of medial malleolus of unspecified tibia, initial encounter for closed fracture: Secondary | ICD-10-CM

## 2014-04-26 DIAGNOSIS — W19XXXA Unspecified fall, initial encounter: Secondary | ICD-10-CM

## 2014-04-26 DIAGNOSIS — W010XXA Fall on same level from slipping, tripping and stumbling without subsequent striking against object, initial encounter: Secondary | ICD-10-CM

## 2014-04-26 DIAGNOSIS — Y92009 Unspecified place in unspecified non-institutional (private) residence as the place of occurrence of the external cause: Secondary | ICD-10-CM

## 2014-04-26 MED ORDER — ONDANSETRON 4 MG PO TBDP
ORAL_TABLET | ORAL | Status: AC
  Filled 2014-04-26: qty 2

## 2014-04-26 MED ORDER — HYDROCODONE-ACETAMINOPHEN 5-325 MG PO TABS
2.0000 | ORAL_TABLET | Freq: Once | ORAL | Status: AC
  Administered 2014-04-26: 2 via ORAL

## 2014-04-26 MED ORDER — HYDROCODONE-ACETAMINOPHEN 5-325 MG PO TABS: ORAL_TABLET | ORAL | Status: DC

## 2014-04-26 MED ORDER — ONDANSETRON 8 MG PO TBDP: 8.0000 mg | ORAL_TABLET | Freq: Three times a day (TID) | ORAL | Status: DC | PRN

## 2014-04-26 MED ORDER — ONDANSETRON 4 MG PO TBDP
8.0000 mg | ORAL_TABLET | Freq: Once | ORAL | Status: AC
Start: 2014-04-26 — End: 2014-04-26
  Administered 2014-04-26: 8 mg via ORAL

## 2014-04-26 MED ORDER — HYDROCODONE-ACETAMINOPHEN 5-325 MG PO TABS
ORAL_TABLET | ORAL | Status: AC
  Filled 2014-04-26: qty 2

## 2014-04-26 NOTE — ED Provider Notes (Signed)
Chief Complaint   Chief Complaint  Patient presents with  . Leg Injury    History of Present Illness   Hailey Walsh is a 33 year old female. She works for herself cleaning peoples houses. She was at someone's house, when she slipped and fell while washing windows. She twisted her right ankle, as been swollen and painful ever since. This happened 4 days ago. She is able to ambulate but it hurts to walk. The leg feels better she elevates it is worse if it's dependent.  Review of Systems   Other than as noted above, the patient denies any of the following symptoms: Systemic:  No fevers or chills.   Musculoskeletal:  No joint pain or swelling, back pain, or neck pain. Neurological:  No muscular weakness or paresthesias.  [place]   Past medical history, family history, social history, meds, and allergies were reviewed.   Physical Examination     Vital signs:  BP 132/91  Pulse 81  Temp(Src) 97.9 F (36.6 C) (Oral)  Resp 16  SpO2 100%  LMP 04/11/2014 Gen:  Alert and oriented times 3.  In no distress. Musculoskeletal: Exam of the ankle reveals the right ankle is markedly swollen, particularly medially over the medial malleolus with lots of ecchymosis and tenderness to palpation. She has minimal tenderness over the lateral malleolus. There is some tenderness over the foot. Ankle has a limited range of motion with pain.Marland Kitchen Anterior drawer sign negative.  Talar tilt negative. Squeeze test positive. Achilles tendon, peroneal tendon, and tibialis posterior were intact. Otherwise, all joints had a full a ROM with no swelling, bruising or deformity.  No edema, pulses full. Extremities were warm and pink.  Capillary refill was brisk.  Skin:  Clear, warm and dry.  No rash. Neuro:  Alert and oriented times 3.  Muscle strength was normal.  Sensation was intact to light touch.   Radiology   Dg Ankle Complete Right  04/26/2014   CLINICAL DATA:  Ankle injury, pain, and bruising.  EXAM: RIGHT  ANKLE - COMPLETE 3+ VIEW  COMPARISON:  None.  FINDINGS: Oblique, minimally displaced fracture seen through medial malleolus, just above the level of the tibial plafond. No other fracture identified. Talus remains centered within the ankle mortise. Soft tissue swelling and ankle joint effusion noted.  IMPRESSION: Minimally displaced fracture through the medial malleolus, just above the level of the tibial plafond.   Electronically Signed   By: Earle Gell M.D.   On: 04/26/2014 10:00   Dg Foot Complete Right  04/26/2014   CLINICAL DATA:  Status post injury now with pain and swelling and bruising medially over the ankle and over the entire right foot.  EXAM: RIGHT FOOT COMPLETE - 3+ VIEW  COMPARISON:  Right ankle series of today's date.  FINDINGS: The bones of the right foot are adequately mineralized. There is no acute fracture nor dislocation. The interphalangeal and metatarsophalangeal joints are normal. The bones of the hindfoot are intact. There is a new horizontally oriented fracture through the medial malleolus. There is soft tissue swelling over the midfoot and hindfoot.  IMPRESSION: There is an acute medial malleolar fracture. There is no acute fracture of the foot.   Electronically Signed   By: David  Martinique   On: 04/26/2014 10:01     I reviewed the images independently and personally and concur with the radiologist's findings.  Course in Urgent [place]   The following medications were given:  Medications  HYDROcodone-acetaminophen (NORCO/VICODIN) 5-325 MG per tablet 2  tablet (2 tablets Oral Given 04/26/14 0930)  ondansetron (ZOFRAN-ODT) disintegrating tablet 8 mg (8 mg Oral Given 04/26/14 1046)   The patient was placed in a short leg stirrup and posterior splint. She has crutches for ambulation. She was told not to year drink anything, and the orthopedic office was called and they will see her today at 3 PM.  Assessment   The primary encounter diagnosis was Medial malleolar fracture,  right, closed, initial encounter. Diagnoses of Fall with injury and Place of occurrence, home were also pertinent to this visit.  Plan     1.  Meds:  The following meds were prescribed:   Discharge Medication List as of 04/26/2014 10:29 AM    START taking these medications   Details  HYDROcodone-acetaminophen (NORCO/VICODIN) 5-325 MG per tablet 1 to 2 tabs every 4 to 6 hours as needed for pain., Print        2.  Patient Education/Counseling:  The patient was given appropriate handouts, self care instructions, including rest and activity, elevation, application of ice and compression, and instructed in symptomatic relief.    3.  Follow up:  The patient was told to follow up here if no better in 3 to 4 days, or sooner if becoming worse in any way, and given some red flag symptoms such as increasing pain or neurological symptoms which would prompt immediate return.  Follow up with orthopedics today at 3 PM..     Harden Mo, MD 04/26/14 [phone]

## 2014-04-26 NOTE — ED Notes (Signed)
Discussed NPO w pt , will remain NPO until surgery decided

## 2014-04-26 NOTE — Discharge Instructions (Signed)
Fractura de tobillo (Ankle Fracture) Una fractura es la ruptura de un hueso. La articulacin del tobillo est compuesta por tres huesos. [place] secciones inferiores (distales) de los huesos de la extremidad inferior, llamados tibia y peron, junto con un hueso del pie, Insurance claims handler. En funcin de la gravedad de la fractura y si hay ms de un hueso de la articulacin del tobillo fracturado, se [place] un yeso o una frula para proteger el hueso fracturado e impedir que este se [place] se suelda. A veces, es necesario realizar la ciruga para ayudar a que la fractura suelde correctamente.  Hay dos tipos generales de fracturas:  Fractura estable. En este tipo de fractura hay una sola lnea de la fractura que atraviesa un hueso sin lesiones en los ligamentos del tobillo. La fractura del astrgalo sin desplazamiento (movimiento del hueso hacia uno de los lados de la lnea de la fractura) tambin es estable.  Fractura inestable. En este tipo de fractura hay ms de una lnea de la fractura que atraviesan uno o ms huesos de la articulacin del tobillo. [place] fracturas con desplazamiento del hueso hacia uno de los lados de la lnea de Electrical engineer. CAUSAS  Un golpe directo en el tobillo.  Una torcedura rpida y grave del tobillo.  Un traumatismo, como un accidente automovilstico o una cada de una altura importante. FACTORES DE RIESGO Puede tener un riesgo ms alto de sufrir una fractura de tobillo si:  Tiene ciertas enfermedades crnicas.  Practica deportes de alto impacto.  Tiene un accidente automovilstico de alto impacto. [place] en el tobillo.  Hematomas alrededor del tobillo lesionado.  Dolor al Interior and spatial designer.  Dificultad para caminar o para soportar peso en el tobillo.  Pie fro por debajo del lugar de la lesin del tobillo. Esto puede ocurrir si los vasos sanguneos que atraviesan el tobillo lesionado tambin se  daaron.  Adormecimiento del pie por debajo del lugar de la lesin del tobillo. DIAGNSTICO  Generalmente, la fractura de tobillo se diagnostica mediante un examen fsico y radiografas. Tambin puede ser necesario realizar una tomografa computarizada si la fractura es compleja. [place], se coloca un yeso o una frula y se usan muletas para no recargar el tobillo lesionado. A esto le sigue un programa de fortalecimiento del tobillo. Algunos pacientes necesitan un tipo especial de yeso, en funcin de otros problemas mdicos que pueden tener. Las fracturas inestables requieren [place] para asegurarse de [place] [employer] se suelden correctamente. El [employer] informar qu tipo de fractura tiene y cul es el mejor tratamiento para su afeccin. INSTRUCCIONES PARA EL CUIDADO EN EL HOGAR   Revise con el mdico cul es la mejor forma de usar las [place] y selas como se lo indiquen. El uso seguro de las muletas es muy importante. El uso indebido de las [employer] puede provocarle cadas o causar lesiones en los nervios de las manos o las [place].  No recargue ni ejerza presin en el tobillo lesionado hasta tanto el mdico se lo indique.  Para disminuir la hinchazn, mantenga elevada la pierna lesionada mientras est sentado o acostado.  Aplique hielo sobre la zona lesionada.  Ponga el hielo en una bolsa plstica.  Coloque una toalla entre el yeso y la bolsa de hielo.  Deje el hielo durante 20 minutos, 2 a 3 veces por da.  Si le colocaron un yeso o un molde de [place] de vidrio:  No trate de rascarse la piel por debajo del yeso con ningn objeto. Esto puede aumentar el riesgo de infecciones cutneas.  [place] piel de alrededor del yeso. Puede colocarse una locin en las zonas rojas o doloridas.  Mantenga el yeso seco y limpio.  Si tiene una frula de yeso:  Se la frula del modo en que se lo indicaron.  Puede aflojar el elstico que  rodea la frula si los dedos se entumecen, siente hormigueos, se enfran o se vuelven de color azul.  No ejerza presin en ninguna parte del yeso o frula; podra romperse. Durante las primeras 24 horas mantenga el yeso sobre una almohada hasta que est completamente duro.  Es posible proteger el yeso o la frula durante el bao con una bolsa de plstico sellada sobre la piel con [place]. No los sumerja en el agua.  Tome todos los medicamentos como le indic el mdico. Utilice los medicamentos de venta libre o recetados para Glass blower/designer, Health and safety inspector o la fiebre, segn se lo indique el mdico.  No conduzca vehculos hasta que el mdico le diga especficamente que puede hacerlo con seguridad.  Si el mdico le ha dado fecha para una visita de control, es importante que concurra. No concurrir a la visita puede derivar en que el dao, el dolor o la discapacidad sean permanentes o crnicos. Si tiene problemas para cumplir con la visita, llame al centro para pedir ayuda. SOLICITE ATENCIN MDICA SI: Aumenta la hinchazn o la molestia. SOLICITE ATENCIN MDICA DE INMEDIATO SI:   Su yeso se daa o se rompe.  Tiene dolor intenso y continuo.  Siente un nuevo dolor o presenta hinchazn despus de la colocacin del yeso.  [place] uas del pie que estn por debajo de la lesin se le ponen azules o grises.  [name] uas del pie que estn por debajo de la lesin estn fras, adormecidas o pierde la sensibilidad al tacto.  Siente mal olor u observa secrecin debajo del yeso. ASEGRESE DE QUE:   Comprende estas instrucciones.  Controlar su afeccin.  Recibir ayuda de inmediato si no mejora o si empeora. Document Released: 10/15/2005 Document Revised: 10/20/2013 [facility] Patient Information 2015 [place]. This information is not intended to replace advice given to you by your health care provider. Make sure you discuss any questions you have with your health care  provider.

## 2014-04-26 NOTE — Progress Notes (Signed)
Orthopedic Tech Progress Note Patient Details:  Hailey Walsh August 29, 1981 [id number]  Ortho Devices Type of Ortho Device: Stirrup splint;Short leg splint Ortho Device/Splint Location: a tri mal splint is applied to the right lower extremity.  Ortho Device/Splint Interventions: Application   Ashok Cordia 04/26/2014, 11:39 AM

## 2014-04-26 NOTE — ED Notes (Signed)
Detailed d/c instructions reviewed w patient via Micki Riley, interpreter

## 2014-04-26 NOTE — ED Notes (Signed)
Pain lower leg x couple of days. Extensive swelling, echymosis

## 2014-04-27 ENCOUNTER — Other Ambulatory Visit (HOSPITAL_COMMUNITY): Payer: Self-pay | Admitting: Orthopedic Surgery

## 2014-04-27 ENCOUNTER — Ambulatory Visit (HOSPITAL_COMMUNITY)
Admission: RE | Admit: 2014-04-27 | Discharge: 2014-04-27 | Disposition: A | Discharge status: Home / Self Care | Payer: No Typology Code available for payment source | Source: Ambulatory Visit | Attending: Orthopedic Surgery | Admitting: Orthopedic Surgery

## 2014-04-27 DIAGNOSIS — M25571 Pain in right ankle and joints of right foot: Secondary | ICD-10-CM

## 2014-04-27 DIAGNOSIS — S82899A Other fracture of unspecified lower leg, initial encounter for closed fracture: Secondary | ICD-10-CM | POA: Insufficient documentation

## 2014-04-27 DIAGNOSIS — X58XXXA Exposure to other specified factors, initial encounter: Secondary | ICD-10-CM | POA: Insufficient documentation

## 2014-04-27 DIAGNOSIS — M25579 Pain in unspecified ankle and joints of unspecified foot: Secondary | ICD-10-CM | POA: Insufficient documentation

## 2014-04-27 DIAGNOSIS — S92309A Fracture of unspecified metatarsal bone(s), unspecified foot, initial encounter for closed fracture: Secondary | ICD-10-CM | POA: Insufficient documentation

## 2014-05-12 ENCOUNTER — Ambulatory Visit (HOSPITAL_COMMUNITY)
Admission: RE | Admit: 2014-05-12 | Discharge: 2014-05-12 | Disposition: A | Discharge status: Home / Self Care | Payer: No Typology Code available for payment source | Source: Ambulatory Visit | Attending: Orthopedic Surgery | Admitting: Orthopedic Surgery

## 2014-05-12 ENCOUNTER — Emergency Department (HOSPITAL_COMMUNITY)
Admission: EM | Admit: 2014-05-12 | Discharge: 2014-05-12 | Disposition: A | Discharge status: Home / Self Care | Payer: No Typology Code available for payment source | Attending: Emergency Medicine | Admitting: Emergency Medicine

## 2014-05-12 ENCOUNTER — Encounter (HOSPITAL_COMMUNITY): Payer: Self-pay | Admitting: Emergency Medicine

## 2014-05-12 ENCOUNTER — Other Ambulatory Visit (HOSPITAL_COMMUNITY): Payer: Self-pay | Admitting: Orthopedic Surgery

## 2014-05-12 DIAGNOSIS — I82401 Acute embolism and thrombosis of unspecified deep veins of right lower extremity: Secondary | ICD-10-CM

## 2014-05-12 DIAGNOSIS — M25561 Pain in right knee: Secondary | ICD-10-CM

## 2014-05-12 DIAGNOSIS — F17200 Nicotine dependence, unspecified, uncomplicated: Secondary | ICD-10-CM | POA: Insufficient documentation

## 2014-05-12 DIAGNOSIS — I82409 Acute embolism and thrombosis of unspecified deep veins of unspecified lower extremity: Secondary | ICD-10-CM | POA: Insufficient documentation

## 2014-05-12 DIAGNOSIS — Z7982 Long term (current) use of aspirin: Secondary | ICD-10-CM | POA: Insufficient documentation

## 2014-05-12 DIAGNOSIS — Z8781 Personal history of (healed) traumatic fracture: Secondary | ICD-10-CM | POA: Insufficient documentation

## 2014-05-12 DIAGNOSIS — Z8742 Personal history of other diseases of the female genital tract: Secondary | ICD-10-CM | POA: Insufficient documentation

## 2014-05-12 DIAGNOSIS — I824Z9 Acute embolism and thrombosis of unspecified deep veins of unspecified distal lower extremity: Secondary | ICD-10-CM | POA: Insufficient documentation

## 2014-05-12 DIAGNOSIS — M79609 Pain in unspecified limb: Secondary | ICD-10-CM

## 2014-05-12 LAB — I-STAT CHEM 8, ED
BUN: 9 mg/dL (ref 6–23)
CHLORIDE: 103 meq/L (ref 96–112)
Calcium, Ion: 1.09 mmol/L — ABNORMAL LOW (ref 1.12–1.23)
Creatinine, Ser: 0.7 mg/dL (ref 0.50–1.10)
Glucose, Bld: 85 mg/dL (ref 70–99)
HEMATOCRIT: 40 % (ref 36.0–46.0)
Hemoglobin: 13.6 g/dL (ref 12.0–15.0)
POTASSIUM: 3.8 meq/L (ref 3.7–5.3)
Sodium: 138 mEq/L (ref 137–147)
TCO2: 24 mmol/L (ref 0–100)

## 2014-05-12 MED ORDER — RIVAROXABAN 15 MG PO TABS
15.0000 mg | ORAL_TABLET | Freq: Once | ORAL | Status: AC
  Administered 2014-05-12: 15 mg via ORAL
  Filled 2014-05-12: qty 1

## 2014-05-12 MED ORDER — XARELTO VTE STARTER PACK 15 & 20 MG PO TBPK: 15.0000 mg | ORAL_TABLET | ORAL | Status: DC

## 2014-05-12 NOTE — ED Notes (Signed)
The pt came here from [place].  She had a rt ankle frac 15 days ago.  She has been having rt calf pain saw her ortho doctor today and was sent here for ana ultra sound that demonstrated a blood clot in her rt calf.  Set here for treatment

## 2014-05-12 NOTE — Progress Notes (Signed)
Bilateral lower extremity venous duplex completed.  Right:  DVT noted in the peroneal vein.  No evidence of superficial thrombosis.  No Baker's cyst.  Left:  No evidence of DVT, superficial thrombosis, or Baker's cyst.   

## 2014-05-12 NOTE — ED Provider Notes (Signed)
CSN: [id number]     Arrival date & time 05/12/14  1658 History   First MD Initiated Contact with Patient 05/12/14 2031     Chief Complaint  Patient presents with  . DVT     (Consider location/radiation/quality/duration/timing/severity/associated sxs/prior Treatment) HPI [name] on birth control and with recent right leg fracture who presents today as a referral from her orthopedist's office for concern for possible DVT. Prior to arrival the patient was able to obtain a DVT study, and it was found to be positive. The patient admits having minimal tenderness to her left calf, as well as minimal swelling in that area. Initially the patient thought it was because of her cast.  Past Medical History  Diagnosis Date  . Incompetent cervix 2011   Past Surgical History  Procedure Laterality Date  . Cervical cerclage  10/16/2010   History reviewed. No pertinent family history. History  Substance Use Topics  . Smoking status: Light Tobacco Smoker    Types: Cigarettes  . Smokeless tobacco: Not on file     Comment: Intermittent smoker-occasional  . Alcohol Use: Not on file   OB History   Grav Para Term Preterm Abortions TAB SAB Ect Mult Living   3 1 1  2   0        Review of Systems  Constitutional: Negative for fever and chills.  HENT: Negative for sore throat.   Eyes: Negative for pain.  Respiratory: Negative for cough and shortness of breath.   Cardiovascular: Negative for chest pain.  Gastrointestinal: Negative for nausea, vomiting, abdominal pain and diarrhea.  Genitourinary: Negative for dysuria.  Musculoskeletal: Negative for back pain.  Skin: Negative for rash.  Neurological: Negative for numbness and headaches.      Allergies  Review of patient's allergies indicates no known allergies.  Home Medications   Prior to Admission medications   Medication Sig Start Date End Date Taking? Authorizing Provider  aspirin 325 MG tablet Take 325 mg by mouth  daily.   Yes Historical Provider, MD  HYDROcodone-acetaminophen (NORCO/VICODIN) 5-325 MG per tablet Take 1 tablet by mouth 2 (two) times daily as needed for moderate pain.   Yes Historical Provider, MD  PRESCRIPTION MEDICATION Take 1 tablet by mouth daily. "Birth Control"   Yes Historical Provider, MD  XARELTO STARTER PACK 15 & 20 MG TBPK Take 15-20 mg by mouth as directed. Take as directed on package: Start with one 15mg  tablet by mouth twice a day with food. On Day 22, switch to one 20mg  tablet once a day with food. 05/12/14   Freddi Che, MD   BP 121/84  Pulse 68  Temp(Src) 98.7 F (37.1 C) (Oral)  Resp 18  SpO2 100%  LMP 04/11/2014 Physical Exam  Constitutional: She is oriented to person, place, and time. She appears well-developed and well-nourished. No distress.  HENT:  Head: Normocephalic and atraumatic.  Eyes: Pupils are equal, round, and reactive to light. Right eye exhibits no discharge. Left eye exhibits no discharge.  Neck: Normal range of motion.  Cardiovascular: Normal rate, regular rhythm and normal heart sounds.   Pulmonary/Chest: Effort normal and breath sounds normal.  Abdominal: Soft. She exhibits no distension. There is no tenderness.  Musculoskeletal: Normal range of motion.       Right lower leg: She exhibits tenderness and swelling. She exhibits no bony tenderness, no edema and no deformity.  R ankle in fracture boot. Upon removal, ankle with minimal swelling, tenderness to posterior ankle.   Neurological: She  is alert and oriented to person, place, and time.  Skin: Skin is warm. She is not diaphoretic.    ED Course  Procedures (including critical care time) Labs Review Labs Reviewed  I-STAT CHEM 8, ED - Abnormal; Notable for the following:    Calcium, Ion 1.09 (*)    All other components within normal limits    Imaging Review No results found.   EKG Interpretation None      MDM   Final diagnoses:  DVT (deep venous thrombosis), right    33 year old female the history of the right foot fracture, history of taking birth control, presents today with right leg pain and swelling.  Patient had a DVT study artery performed prior to my evaluation. Doppler study was positive for DVT in the right leg. Given the patient's signs and symptoms, doubt PE at this time-patient denies any chest pain, shortness of breath, abdominal pain, syncope. The patient has no significant medical problems, plan is to obtain basic labs. Labs demonstrate no significant abnormalities. Plan is to start the patient on Xarelto. Pharmacy plan is to the bedside teaching prior to discharge. Patient discharged with a prescription for Xarelto starter pack. Stricter precautions given. Patient discharged in stable condition. Patient seen and evaluated by myself and by the attending Dr. Melanee Spry, MD 05/12/14 2151

## 2014-05-12 NOTE — Discharge Instructions (Signed)
Trombosis venosa profunda (Deep Vein Thrombosis) La trombosis venosa profunda (TVP) es la formacin de un cogulo de sangre en las venas profundas y ms grandes de la pierna, el brazo o la pelvis. Esta trombosis es ms peligrosa que los cogulos que se forman en las venas ubicadas cerca de la superficie del cuerpo. Puede causar complicaciones si el cogulo se desprende y viaja a travs de la Geographical information systems officer a los pulmones.  La TVP puede daar las vlvulas de las venas de las piernas, de manera que el flujo sanguneo, Engineer, technical sales de ir hacia arriba, se acumula en la parte inferior de la pierna. A este trastorno se lo llama sndrome postrombtico, y Dance movement psychotherapist, hinchazn, decoloracin y lceras en la pierna. CAUSAS Generalmente varios son los motivos que contribuyen a la formacin de cogulos de [place]. Entre los factores que favorecen la formacin de cogulos son:  El flujo sanguneo es lento.  Interior de la vena daado por algn motivo.  Tiene algn padecimiento que favorece la formacin de cogulos de [place]. FACTORES DE RIESGO Algunas personas tienen ms facilidad que otras para formar cogulos sanguneos. Los factores de riesgo son:   La edad avanzada, especialmente despus de los 41aos de edad.  Tener antecedentes familiares de cogulos de sangre o si ya ha tenido un cogulo de [place].  Que le hayan practicado una ciruga mayor o de duracin prolongada. Esto es an ms frecuente en cirugas de cadera, rodilla o estmago (abdomen). La ciruga de cadera es especialmente riesgosa.  Fractura de cadera o de pierna.  Permanecer sentado o acostado durante un tiempo prolongado. Aqu se incluyen un viaje de larga distancia, casos de parlisis o en recuperacin de una enfermedad o ciruga.  Cncer o tratamiento para el cncer.  Tener colocado un tubo fino y Production designer, theatre/television/film (catter) dentro de una vena durante un procedimiento mdico.  Raynelle Jan sobrepeso (ser obeso).  Embarazo y  [place].  Los cambios hormonales durante el embarazo facilitan la formacin de cogulos.  El feto hace presin sobre las venas de la pelvis.  Tambin hay riesgo de lesin de las venas durante el parto o la cesrea. El riesgo es mayor inmediatamente despus del [place].  Los medicamentos que contienen estrgenos. Estos incluyen las pldoras anticonceptivas y la terapia de reemplazo hormonal.  Fumar.  Otras afecciones circulatorias o cardacas.  SIGNOS Y SNTOMAS Cuando se forma un cogulo, puede obstruir en forma parcial o total el flujo sanguneo en la vena afectada. Los sntomas de TVP pueden ser:  Hinchazn de la pierna o del brazo, especialmente notorio de un solo lado.  Enrojecimiento y calor de la pierna o del brazo, especialmente de un slo lado.  Dolor en un brazo o en una pierna. Si el cogulo est ubicado en la pierna, los sntomas pueden ser ms notorios y Copy al pararse o Writer. Los sntomas de TVP, cuando ha migrado a los pulmones, (embolia de [place], [place]) y Database administrator, comienzan sbitamente y pueden ser:  Hollace Kinnier de aire.  Tos.  Toser y Audiological scientist o flema con Biochemist, clinical.  Dolor en el pecho. Dolor que empeora al hacer inspiraciones profundas.  Latidos cardacos rpidos. Cleora Fleet persona con estos sntomas debe buscar rpidamente tratamiento mdico de Freight forwarder. Comunquese con el servicio de emergencias de su localidad (911 en los [place]) si usted tiene estos sntomas. DIAGNSTICO Si se sospecha una TVP, su mdico le har una historia clnica completa y un examen fsico. Podrn solicitarle otros estudios, por ejemplo:  Anlisis de [place], incluyendo estudios  de coagulacin.  Ultrasonografa para evaluar si tiene cogulos en sus piernas o pulmones.  Estudios radiolgicos con inyeccin de contraste intravenoso para ver el flujo sanguneo (venografa).  Estudios de los pulmones, si tiene sntomas respiratorios. [place] piernas con  regularidad. Camine media [employer].  Mantenga un peso apropiado para su altura.  Evite permanecer sentado o parado sin moverse durante perodos prolongados.  Las mujeres, especialmente las que tienen ms de 35aos, deben [employer] y beneficios de tomar medicamentos que contengan estrgeno, incluidas las pldoras anticonceptivas.  No fume, especialmente si toma estrgenos.  Los viajes de larga distancia pueden aumentar el riesgo de TVP. Ejercite las piernas caminando o moviendo los msculos una vez por hora.  Prevencin intrahospitalaria:  Muchos de los factores de riesgo anteriormente mencionados se refieren a situaciones que ocurren con la hospitalizacin, ya sea por enfermedad, lesin o Tour manager.  Su mdico va a evaluar la necesidad de profilaxis del tromboembolismo venoso cuando se lo admita en el hospital. Si debe someterse a [place], su cirujano lo evaluar el Wade despus de la [place].  La prevencin puede ser con o sin medicamentos. TRATAMIENTO Si se diagnostica una TVP, debe ser tratada. Tambin en algunas circunstancias, puede prevenirse. Si ya ha tenido una TVP, aumenta el riesgo de volver a Paramedic futuro . El tratamiento ms frecuente para la TVP se realiza con medicamentos anticoagulantes que reducen la tendencia de la sangre a coagularse. Los anticoagulantes pueden impedir la formacin de nuevos cogulos de sangre o que los ya existentes aumenten de Personal assistant. No pueden disolver los cogulos ya formados. El organismo lo hace por s mismo con Physiological scientist. Los anticoagulantes se pueden administrar por va oral, por va intravenosa (IV) o inyectable. Su mdico determinar la mejor forma para usted. Estos son otros medicamentos o tratamiento que pueden utilizarse:  La heparina o los medicamentos relacionados (heparina de bajo peso molecular) son por lo general Software engineer tratamiento para un cogulo de [place]. Actan rpidamente. Sin  embargo, no pueden tomarse por va oral.  La heparina disminuye la cantidad de un componente de la sangre cuya funcin es impedir las hemorragias y forma cogulos sanguneos (plaquetas). Ser controlado mediante anlisis de sangre para asegurarse de que esto no [place].  La warfarina es un anticoagulante que puede ingerirse. Demora unos [employer] a Chief of Staff, por lo tanto se [place] en combinacin con heparina o medicamentos relacionados. Una vez que la warfarina comienza a actuar, la heparina se suspende.  Con menor frecuencia, se utilizan medicamentos que disuelven cogulos (trombolticos) para Radiation protection practitioner una TVP. Como implican un mayor riesgo de [place], se utilizan principalmente en los casos ms graves, cuando la vida o la integridad fsica se ve Financial trader.  En contadas ocasiones, un cogulo de sangre en la pierna debe eliminarse quirrgicamente.  Si usted no puede Engineer, site, su mdico podra indicar la colocacin de un filtro en la vena principal del abdomen. Los filtros impiden que los cogulos lleguen a los pulmones. INSTRUCCIONES PARA EL CUIDADO EN EL HOGAR  Tome todos los medicamentos como le indic el mdico. Utilice los medicamentos de venta libre o recetados para Glass blower/designer, el malestar o la fiebre, segn se lo indique el mdico.  [place]. [place] [employer] de las personas deber seguir tomando warfarina despus del alta hospitalaria. Su mdico le indicar la duracin del tratamiento (generalmente de 3 a 43meses y a Clinical cytogeneticist toda la vida).  Las dosis  altas y bajas de warfarina son peligrosas. El exceso de warfarina aumenta el riesgo de [place]. Dosis demasiado bajas de warfarina aumentan el riesgo de formacin de cogulos. Durante el tratamiento con warfarina, es necesario que se realicen peridicamente anlisis de sangre que miden el tiempo de Proofreader. Estos anlisis de sangre suelen incluir tanto el tiempo de protrombina (PT) como el ndice internacional  normalizado (INR). Los resultados del PT y del INR permiten al mdico ajustar la dosis de warfarina. La dosis puede cambiarse por varias razones. Es de suma importancia que tome la warfarina exactamente como se le prescribi, y que tenga los niveles de PT y de INR exactamente como se le indic.  Muchos de los alimentos, especialmente los que contienen vitamina K pueden interferir con la warfarina y Print production planner los [place] de PT y del INR. Los alimentos ricos en vitamina K son la espinaca, la col rizada, el brcoli, el repollo, la col y el nabo, repollitos de [place], guisantes, Dance movement psychotherapist, [employer] y [place], y tambin la carne de hgado de cerdo y [place], el t verde y el aceite de soja. Debe consumir siempre la misma cantidad de alimentos ricos en vitamina K. Evite los [employer] en su dieta, o informe a su mdico antes de cambiar su dieta. Solicite una cita con un nutricionista para hacerle las preguntas que le surjan.  Muchos medicamentos interfieren con la warfarina y [employer] del PT y del INR. Debe informarle a su mdico acerca de todos los medicamentos que tome. Esto incluye todas las vitaminas y suplementos. Tenga especial cuidado con la aspirina y los medicamentos antiinflamatorios. Consulte a su mdico antes de tomarlos. No tome ni suspenda ningn medicamento tanto recetado como de venta libre, a menos que se lo haya indicado su mdico o Development worker, international aid.  La warfarina puede tener efectos secundarios, principalmente aumento de hematomas o sangrado. Si se lastima, deber ejercer presin sobre las heridas por ms tiempo que lo habitual. Su mdico o su Development worker, international aid comentarn otros posibles efectos secundarios.  El alcohol puede modificar la capacidad del organismo de metabolizar la warfarina. Es preferible evitar tomar bebidas alcohlicas o consumir solo muy pequeas cantidades cuando se est en tratamiento con warfarina. Hgale saber a su mdico si modifica su consumo de  alcohol.  Informe a su dentista o a otros profesionales antes de que le efecten cualquier procedimiento.  Actividad. Consulte con su mdico cundo podr volver a sus actividades normales. Es importante mantenerse activo para prevenir los cogulos sanguneos. Si est tomando un medicamento anticoagulante, evite los deportes de contacto.  Haga actividad fsica. Es Recruitment consultant. Esto es especialmente importante durante viajes, o al estar sentado o de pie durante largos perodos. Ejercite las piernas caminando o bombeando los msculos con frecuencia. [place] con frecuencia.  Medias de compresin. Son medias elsticas apretadas que aplican presin a las piernas. Esta presin ayuda a Journalist, newspaper en las piernas se coagule. Es posible que deba usar medias de compresin en su casa para prevenir una TVP.  No fume. Si fuma, abandone el hbito. Pdale ayuda al mdico, si es necesario.  Aprenda tanto como pueda sobre la TVP. Conocer ms sobre la enfermedad lo ayudar a Radio producer las medidas necesarias para evitar que vuelva.  Utilice un brazalete o lleve consigo una tarjeta de alerta mdico. SOLICITE ATENCIN MDICA SI:  Nota que el ritmo de sus latidos cardacos est acelerado.  Se siente ms dbil o ms cansado que de costumbre.  Siente que va  a desmayarse.  Observa un aumento de los hematomas.  Siente que los sntomas no mejoran en el tiempo esperado.  Cree que tiene efectos secundarios por el medicamento. SOLICITE ATENCIN MDICA DE INMEDIATO SI:  Siente dolor en el pecho.  Tiene dificultad para respirar.  Aumenta la hinchazn o el dolor en una pierna.  Tose y escupe sangre.  Nota sangre en el vmito, en las heces o en la orina. ASEGRESE DE QUE:  Comprende estas instrucciones.  Controlar su afeccin.  Recibir ayuda de inmediato si no mejora o si empeora. Document Released: 01/22/2008 Document Revised: 08/05/2013 [facility] Patient Information 2015  [place]. This information is not intended to replace advice given to you by your health care provider. Make sure you discuss any questions you have with your health care provider.

## 2014-05-12 NOTE — ED Notes (Signed)
Pt and friend reports pt fell recently and had fracture to right foot, went to ortho md today for recheck, had cast removed and sent for doppler due to right calf pain. +dvt.

## 2014-05-12 NOTE — Progress Notes (Signed)
Xarelto education completed with the patient and her interpreter. Patient was given xarelto discharge kit. All questions answered.  Nena Jordan, PharmD, BCPS 05/12/2014, 10:01 PM

## 2014-05-12 NOTE — ED Notes (Signed)
The pt was discharged by accident.  She is not ready to go home yet

## 2014-05-23 NOTE — ED Provider Notes (Signed)
Patient seen and evaluated. History of recent orthopedic fracture. Pain and swelling of the right leg. Exam shows her intact cap refill and sensation. Clinically no chest pain or shortness of breath. Not hypoxemic or tachycardic. Doppler (arrival shows DVT. Plan the patient treatment. I read Dr. He and I discussed the patient's care and I agree with his assessment and plan.  Tanna Furry, MD 05/23/14 [phone]

## 2014-05-23 NOTE — ED Provider Notes (Signed)
I saw and evaluated the patient, reviewed the resident's note and I agree with the findings and plan.   EKG Interpretation None        Tanna Furry, MD 05/23/14 2359

## 2014-06-08 ENCOUNTER — Ambulatory Visit: Payer: No Typology Code available for payment source | Attending: Internal Medicine | Admitting: Internal Medicine

## 2014-06-08 ENCOUNTER — Encounter: Payer: Self-pay | Admitting: Internal Medicine

## 2014-06-08 VITALS — BP 105/67 | HR 75 | Temp 98.2°F | Resp 18 | Wt 159.0 lb

## 2014-06-08 DIAGNOSIS — Z124 Encounter for screening for malignant neoplasm of cervix: Secondary | ICD-10-CM | POA: Insufficient documentation

## 2014-06-08 DIAGNOSIS — N883 Incompetence of cervix uteri: Secondary | ICD-10-CM | POA: Insufficient documentation

## 2014-06-08 DIAGNOSIS — N898 Other specified noninflammatory disorders of vagina: Secondary | ICD-10-CM | POA: Insufficient documentation

## 2014-06-08 DIAGNOSIS — I82401 Acute embolism and thrombosis of unspecified deep veins of right lower extremity: Secondary | ICD-10-CM

## 2014-06-08 DIAGNOSIS — Z7982 Long term (current) use of aspirin: Secondary | ICD-10-CM | POA: Insufficient documentation

## 2014-06-08 DIAGNOSIS — I82409 Acute embolism and thrombosis of unspecified deep veins of unspecified lower extremity: Secondary | ICD-10-CM

## 2014-06-08 DIAGNOSIS — Z139 Encounter for screening, unspecified: Secondary | ICD-10-CM

## 2014-06-08 DIAGNOSIS — F17200 Nicotine dependence, unspecified, uncomplicated: Secondary | ICD-10-CM | POA: Insufficient documentation

## 2014-06-08 MED ORDER — METRONIDAZOLE 500 MG PO TABS: 500.0000 mg | ORAL_TABLET | Freq: Three times a day (TID) | ORAL | Status: DC

## 2014-06-08 MED ORDER — FLUCONAZOLE 150 MG PO TABS: 150.0000 mg | ORAL_TABLET | Freq: Once | ORAL | Status: DC

## 2014-06-08 MED ORDER — RIVAROXABAN 20 MG PO TABS: 20.0000 mg | ORAL_TABLET | Freq: Every day | ORAL | Status: DC

## 2014-06-08 NOTE — Progress Notes (Signed)
Pt is here to review meds and refill her Xarelto Also would like general labs Alert w/no signs of acute distress.

## 2014-06-08 NOTE — Progress Notes (Signed)
MRN: [id number] Name: Hailey Walsh  Sex: female Age: 33 y.o. DOB: Dec 30, 1980  Allergies: Review of patient's allergies indicates no known allergies.  Chief Complaint  Patient presents with  . Medication Refill    HPI: Patient is 33 y.o. female who  has history of right medial malleolar fracture recently went to the emergency room with swelling and had an ultrasound done found to have DVT, she was started on Xarelto, patient requesting refill on her medication also complaining of vaginal discharge for the last 2 weeks which is greenish in color, denies any nausea vomiting change in bowel habits.  Past Medical History  Diagnosis Date  . Incompetent cervix 2011    Past Surgical History  Procedure Laterality Date  . Cervical cerclage  10/16/2010      Medication List       This list is accurate as of: 06/08/14  4:48 PM.  Always use your most recent med list.               aspirin 325 MG tablet  Take 325 mg by mouth daily.     fluconazole 150 MG tablet  Commonly known as:  DIFLUCAN  Take 1 tablet (150 mg total) by mouth once.     HYDROcodone-acetaminophen 5-325 MG per tablet  Commonly known as:  NORCO/VICODIN  Take 1 tablet by mouth 2 (two) times daily as needed for moderate pain.     metroNIDAZOLE 500 MG tablet  Commonly known as:  FLAGYL  Take 1 tablet (500 mg total) by mouth 3 (three) times daily.     PRESCRIPTION MEDICATION  Take 1 tablet by mouth daily. "Birth Control"     XARELTO STARTER PACK 15 & 20 MG Tbpk  Generic drug:  Rivaroxaban  Take 15-20 mg by mouth as directed. Take as directed on package: Start with one 15mg  tablet by mouth twice a day with food. On Day 22, switch to one 20mg  tablet once a day with food.     rivaroxaban 20 MG Tabs tablet  Commonly known as:  XARELTO  Take 1 tablet (20 mg total) by mouth daily with supper.        Meds ordered this encounter  Medications  . rivaroxaban (XARELTO) 20 MG TABS tablet    Sig: Take 1  tablet (20 mg total) by mouth daily with supper.    Dispense:  30 tablet    Refill:  5  . fluconazole (DIFLUCAN) 150 MG tablet    Sig: Take 1 tablet (150 mg total) by mouth once.    Dispense:  1 tablet    Refill:  0  . metroNIDAZOLE (FLAGYL) 500 MG tablet    Sig: Take 1 tablet (500 mg total) by mouth 3 (three) times daily.    Dispense:  21 tablet    Refill:  0    There is no immunization history for the selected administration types on file for this patient.  No family history on file.  History  Substance Use Topics  . Smoking status: Light Tobacco Smoker    Types: Cigarettes  . Smokeless tobacco: Not on file     Comment: Intermittent smoker-occasional  . Alcohol Use: Not on file    Review of Systems   As noted in HPI  Filed Vitals:   06/08/14 1617  BP: 105/67  Pulse: 75  Temp: 98.2 F (36.8 C)  Resp: 18    Physical Exam  Physical Exam  Constitutional: No distress.  Eyes: EOM are normal.  Pupils are equal, round, and reactive to light.  Cardiovascular: Normal rate and regular rhythm.   Pulmonary/Chest: Breath sounds normal. No respiratory distress. She has no wheezes. She has no rales.  Musculoskeletal: She exhibits no edema.    CBC    Component Value Date/Time   WBC 12.4* 02/22/2011 0858   RBC 3.67* 02/22/2011 0858   HGB 13.6 05/12/2014 2046   HCT 40.0 05/12/2014 2046   PLT 131 DELTA CHECK NOTED REPEATED TO VERIFY* 02/22/2011 0858   MCV 93.2 02/22/2011 0858   LYMPHSABS 1.5 09/20/2010 2219   MONOABS 0.4 09/20/2010 2219   EOSABS 0.1 09/20/2010 2219   BASOSABS 0.0 09/20/2010 2219    CMP     Component Value Date/Time   NA 138 05/12/2014 2046   K 3.8 05/12/2014 2046   CL 103 05/12/2014 2046   GLUCOSE 85 05/12/2014 2046   BUN 9 05/12/2014 2046   CREATININE 0.70 05/12/2014 2046    No results found for this basename: chol, tri, ldl    No components found with this basename: hga1c    No results found for this basename: AST    Assessment and Plan  DVT  (deep venous thrombosis), right - Plan: Patient denies any family history of DVT, recently had fracture most likely developed secondary to that , she is given refill on rivaroxaban (XARELTO) 20 MG TABS tablet to continue for total of 6 months.  Vaginal discharge - Plan: fluconazole (DIFLUCAN) 150 MG tablet, metroNIDAZOLE (FLAGYL) 500 MG tablet, patient is also going to follow with her GI.  Screening - Plan: Will check TSH, Vit D  25 hydroxy (rtn osteoporosis monitoring)   Health Maintenance  -Pap Smear: following up with GYN   Return in about 3 months (around 09/08/2014).  Lorayne Marek, MD

## 2014-06-09 DIAGNOSIS — Z86718 Personal history of other venous thrombosis and embolism: Secondary | ICD-10-CM | POA: Insufficient documentation

## 2014-06-09 LAB — TSH: TSH: 1.912 u[IU]/mL (ref 0.350–4.500)

## 2014-06-09 LAB — VITAMIN D 25 HYDROXY (VIT D DEFICIENCY, FRACTURES): Vit D, 25-Hydroxy: 42 ng/mL (ref 30–89)

## 2014-06-10 ENCOUNTER — Telehealth: Payer: Self-pay | Admitting: Emergency Medicine

## 2014-06-24 ENCOUNTER — Ambulatory Visit: Payer: No Typology Code available for payment source | Attending: Internal Medicine

## 2014-06-29 ENCOUNTER — Other Ambulatory Visit: Payer: Self-pay

## 2014-06-29 DIAGNOSIS — I82401 Acute embolism and thrombosis of unspecified deep veins of right lower extremity: Secondary | ICD-10-CM

## 2014-06-29 MED ORDER — RIVAROXABAN 20 MG PO TABS: 20.0000 mg | ORAL_TABLET | Freq: Every day | ORAL | Status: DC

## 2014-08-30 ENCOUNTER — Encounter: Payer: Self-pay | Admitting: Internal Medicine

## 2014-11-29 DIAGNOSIS — I82409 Acute embolism and thrombosis of unspecified deep veins of unspecified lower extremity: Secondary | ICD-10-CM

## 2014-11-29 HISTORY — DX: Acute embolism and thrombosis of unspecified deep veins of unspecified lower extremity: I82.409

## 2014-12-16 ENCOUNTER — Emergency Department (HOSPITAL_COMMUNITY): Payer: Medicaid Other

## 2014-12-16 ENCOUNTER — Inpatient Hospital Stay (HOSPITAL_COMMUNITY)
Admission: EM | Admit: 2014-12-16 | Discharge: 2014-12-21 | DRG: 958 | Disposition: A | Discharge status: Home / Self Care | Payer: Medicaid Other | Attending: General Surgery | Admitting: General Surgery

## 2014-12-16 ENCOUNTER — Encounter (HOSPITAL_COMMUNITY): Payer: Self-pay

## 2014-12-16 DIAGNOSIS — Z7901 Long term (current) use of anticoagulants: Secondary | ICD-10-CM | POA: Diagnosis not present

## 2014-12-16 DIAGNOSIS — W11XXXA Fall on and from ladder, initial encounter: Secondary | ICD-10-CM | POA: Diagnosis present

## 2014-12-16 DIAGNOSIS — W19XXXA Unspecified fall, initial encounter: Secondary | ICD-10-CM | POA: Diagnosis present

## 2014-12-16 DIAGNOSIS — S36039A Unspecified laceration of spleen, initial encounter: Secondary | ICD-10-CM | POA: Diagnosis present

## 2014-12-16 DIAGNOSIS — T148XXA Other injury of unspecified body region, initial encounter: Secondary | ICD-10-CM

## 2014-12-16 DIAGNOSIS — Z86718 Personal history of other venous thrombosis and embolism: Secondary | ICD-10-CM

## 2014-12-16 DIAGNOSIS — D72829 Elevated white blood cell count, unspecified: Secondary | ICD-10-CM

## 2014-12-16 DIAGNOSIS — R945 Abnormal results of liver function studies: Secondary | ICD-10-CM

## 2014-12-16 DIAGNOSIS — F17210 Nicotine dependence, cigarettes, uncomplicated: Secondary | ICD-10-CM | POA: Diagnosis present

## 2014-12-16 DIAGNOSIS — S32810A Multiple fractures of pelvis with stable disruption of pelvic ring, initial encounter for closed fracture: Secondary | ICD-10-CM

## 2014-12-16 DIAGNOSIS — Z7982 Long term (current) use of aspirin: Secondary | ICD-10-CM | POA: Diagnosis not present

## 2014-12-16 DIAGNOSIS — Z419 Encounter for procedure for purposes other than remedying health state, unspecified: Secondary | ICD-10-CM

## 2014-12-16 DIAGNOSIS — R40241 Glasgow coma scale score 13-15: Secondary | ICD-10-CM | POA: Diagnosis present

## 2014-12-16 DIAGNOSIS — S32009A Unspecified fracture of unspecified lumbar vertebra, initial encounter for closed fracture: Secondary | ICD-10-CM

## 2014-12-16 DIAGNOSIS — S62102A Fracture of unspecified carpal bone, left wrist, initial encounter for closed fracture: Secondary | ICD-10-CM | POA: Diagnosis present

## 2014-12-16 DIAGNOSIS — S32811A Multiple fractures of pelvis with unstable disruption of pelvic ring, initial encounter for closed fracture: Principal | ICD-10-CM | POA: Diagnosis present

## 2014-12-16 DIAGNOSIS — D62 Acute posthemorrhagic anemia: Secondary | ICD-10-CM | POA: Diagnosis not present

## 2014-12-16 DIAGNOSIS — S52502A Unspecified fracture of the lower end of left radius, initial encounter for closed fracture: Secondary | ICD-10-CM | POA: Diagnosis present

## 2014-12-16 DIAGNOSIS — S32119A Unspecified Zone I fracture of sacrum, initial encounter for closed fracture: Secondary | ICD-10-CM | POA: Diagnosis present

## 2014-12-16 DIAGNOSIS — Z9889 Other specified postprocedural states: Secondary | ICD-10-CM

## 2014-12-16 DIAGNOSIS — I82401 Acute embolism and thrombosis of unspecified deep veins of right lower extremity: Secondary | ICD-10-CM

## 2014-12-16 DIAGNOSIS — S32029A Unspecified fracture of second lumbar vertebra, initial encounter for closed fracture: Secondary | ICD-10-CM | POA: Diagnosis present

## 2014-12-16 DIAGNOSIS — R319 Hematuria, unspecified: Secondary | ICD-10-CM

## 2014-12-16 DIAGNOSIS — S3210XA Unspecified fracture of sacrum, initial encounter for closed fracture: Secondary | ICD-10-CM

## 2014-12-16 DIAGNOSIS — R7989 Other specified abnormal findings of blood chemistry: Secondary | ICD-10-CM

## 2014-12-16 LAB — LIPASE, BLOOD: LIPASE: 33 U/L (ref 11–59)

## 2014-12-16 LAB — I-STAT CHEM 8, ED
BUN: 14 mg/dL (ref 6–23)
Calcium, Ion: 1.12 mmol/L (ref 1.12–1.23)
Chloride: 105 mmol/L (ref 96–112)
Creatinine, Ser: 0.7 mg/dL (ref 0.50–1.10)
Glucose, Bld: 139 mg/dL — ABNORMAL HIGH (ref 70–99)
HCT: 42 % (ref 36.0–46.0)
HEMOGLOBIN: 14.3 g/dL (ref 12.0–15.0)
Potassium: 3.6 mmol/L (ref 3.5–5.1)
SODIUM: 141 mmol/L (ref 135–145)
TCO2: 20 mmol/L (ref 0–100)

## 2014-12-16 LAB — COMPREHENSIVE METABOLIC PANEL
ALT: 72 U/L — ABNORMAL HIGH (ref 0–35)
AST: 90 U/L — AB (ref 0–37)
Albumin: 3.8 g/dL (ref 3.5–5.2)
Alkaline Phosphatase: 68 U/L (ref 39–117)
Anion gap: 8 (ref 5–15)
BUN: 12 mg/dL (ref 6–23)
CALCIUM: 8.4 mg/dL (ref 8.4–10.5)
CO2: 22 mmol/L (ref 19–32)
CREATININE: 0.64 mg/dL (ref 0.50–1.10)
Chloride: 107 mmol/L (ref 96–112)
GFR calc non Af Amer: >90 mL/min (ref >90)
Glucose, Bld: 138 mg/dL — ABNORMAL HIGH (ref 70–99)
Potassium: 3.6 mmol/L (ref 3.5–5.1)
Sodium: 137 mmol/L (ref 135–145)
Total Bilirubin: 0.5 mg/dL (ref 0.3–1.2)
Total Protein: 6.7 g/dL (ref 6.0–8.3)

## 2014-12-16 LAB — URINALYSIS, ROUTINE W REFLEX MICROSCOPIC
Bilirubin Urine: NEGATIVE
GLUCOSE, UA: NEGATIVE mg/dL
Ketones, ur: NEGATIVE mg/dL
Nitrite: NEGATIVE
PROTEIN: 30 mg/dL — AB
Specific Gravity, Urine: 1.014 (ref 1.005–1.030)
Urobilinogen, UA: 0.2 mg/dL (ref 0.0–1.0)
pH: 7.5 (ref 5.0–8.0)

## 2014-12-16 LAB — CBC
HCT: 37.7 % (ref 36.0–46.0)
HEMOGLOBIN: 13.3 g/dL (ref 12.0–15.0)
MCH: 32.5 pg (ref 26.0–34.0)
MCHC: 35.3 g/dL (ref 30.0–36.0)
MCV: 92.2 fL (ref 78.0–100.0)
Platelets: 205 10*3/uL (ref 150–400)
RBC: 4.09 MIL/uL (ref 3.87–5.11)
RDW: 12.1 % (ref 11.5–15.5)
WBC: 16.6 10*3/uL — ABNORMAL HIGH (ref 4.0–10.5)

## 2014-12-16 LAB — I-STAT BETA HCG BLOOD, ED (MC, WL, AP ONLY): I-stat hCG, quantitative: <5 m[IU]/mL (ref <5)

## 2014-12-16 LAB — PROTIME-INR
INR: 1.02 (ref 0.00–1.49)
Prothrombin Time: 13.5 seconds (ref 11.6–15.2)

## 2014-12-16 LAB — TYPE AND SCREEN
ABO/RH(D): O POS
Antibody Screen: NEGATIVE

## 2014-12-16 LAB — ABO/RH: ABO/RH(D): O POS

## 2014-12-16 LAB — ETHANOL

## 2014-12-16 LAB — URINE MICROSCOPIC-ADD ON

## 2014-12-16 LAB — SAMPLE TO BLOOD BANK

## 2014-12-16 MED ORDER — FENTANYL CITRATE 0.05 MG/ML IJ SOLN
INTRAMUSCULAR | Status: AC
  Filled 2014-12-16: qty 2

## 2014-12-16 MED ORDER — ONDANSETRON HCL 4 MG/2ML IJ SOLN
4.0000 mg | Freq: Four times a day (QID) | INTRAMUSCULAR | Status: DC | PRN
Start: 2014-12-16 — End: 2014-12-21
  Administered 2014-12-17 – 2014-12-20 (×2): 4 mg via INTRAVENOUS
  Filled 2014-12-16 (×2): qty 2

## 2014-12-16 MED ORDER — FENTANYL CITRATE 0.05 MG/ML IJ SOLN
50.0000 ug | Freq: Once | INTRAMUSCULAR | Status: AC
  Administered 2014-12-16: 50 ug via INTRAVENOUS
  Filled 2014-12-16: qty 2

## 2014-12-16 MED ORDER — PANTOPRAZOLE SODIUM 40 MG IV SOLR
40.0000 mg | Freq: Every day | INTRAVENOUS | Status: DC
  Administered 2014-12-16 – 2014-12-17 (×2): 40 mg via INTRAVENOUS
  Filled 2014-12-16 (×2): qty 40

## 2014-12-16 MED ORDER — IOHEXOL 300 MG/ML  SOLN
100.0000 mL | Freq: Once | INTRAMUSCULAR | Status: AC | PRN
  Administered 2014-12-16: 100 mL via INTRAVENOUS

## 2014-12-16 MED ORDER — PANTOPRAZOLE SODIUM 40 MG PO TBEC
40.0000 mg | DELAYED_RELEASE_TABLET | Freq: Every day | ORAL | Status: DC
  Administered 2014-12-18 – 2014-12-20 (×3): 40 mg via ORAL
  Filled 2014-12-16 (×3): qty 1

## 2014-12-16 MED ORDER — SODIUM CHLORIDE 0.9 % IV BOLUS (SEPSIS)
1000.0000 mL | Freq: Once | INTRAVENOUS | Status: AC
  Administered 2014-12-16: 1000 mL via INTRAVENOUS

## 2014-12-16 MED ORDER — FENTANYL CITRATE 0.05 MG/ML IJ SOLN
50.0000 ug | Freq: Once | INTRAMUSCULAR | Status: AC
  Administered 2014-12-16: 50 ug via NASAL

## 2014-12-16 MED ORDER — HYDROMORPHONE HCL 1 MG/ML IJ SOLN
1.0000 mg | INTRAMUSCULAR | Status: DC | PRN
  Administered 2014-12-16 – 2014-12-19 (×14): 1 mg via INTRAVENOUS
  Filled 2014-12-16 (×14): qty 1

## 2014-12-16 MED ORDER — ONDANSETRON HCL 4 MG PO TABS: 4.0000 mg | ORAL_TABLET | Freq: Four times a day (QID) | ORAL | Status: DC | PRN

## 2014-12-16 MED ORDER — DEXTROSE-NACL 5-0.9 % IV SOLN
INTRAVENOUS | Status: DC
  Administered 2014-12-16 – 2014-12-20 (×6): via INTRAVENOUS

## 2014-12-16 NOTE — ED Notes (Signed)
Dr isaacs at bedside. °

## 2014-12-16 NOTE — Progress Notes (Signed)
Responded to page to provide support to patient and family at bedside. Pt. fell two storage while washing window. Pt. doesn't speak english.  Family member providing translation for staff.  Spoke with pt. Brother regarding other needs. Husband in route to hospital. Will follow as needed.   12/16/14 1600  Clinical Encounter Type  Visited With Patient and family together;Health care provider  Visit Type Initial;Spiritual support;ED  Referral From Nurse  Spiritual Encounters  Spiritual Needs Emotional  Stress Factors  Patient Stress Factors None identified  Family Stress Factors None identified  Carlo Lorson, Chaplain

## 2014-12-16 NOTE — ED Notes (Signed)
Consent signed by patients husband after Dr Amedeo Plenty spoke with pt and her husband using translator phone. All questions were answered to pt.

## 2014-12-16 NOTE — Consult Note (Signed)
Reason for Consult:  Sacral fracture Referring Physician: EDP  Hailey Walsh is an 34 y.o. female.  HPI: 34 yo female who sustained a mechanical fall from a significant height.  Was found to have numerus injuries including a sacral fracture.  He husband is at the bedside and does understand some english.  He interprets for her and she seems to understand some english enough to participate in her exam.  Past Medical History  Diagnosis Date  . Incompetent cervix 2011    Past Surgical History  Procedure Laterality Date  . Cervical cerclage  10/16/2010  . Abdominal surgery      History reviewed. No pertinent family history.  Social History:  reports that she has been smoking Cigarettes.  She does not have any smokeless tobacco history on file. She reports that she does not drink alcohol or use illicit drugs.  Allergies: No Known Allergies  Medications: I have reviewed the patient's current medications.  Results for orders placed or performed during the hospital encounter of 12/16/14 (from the past 48 hour(s))  Type and screen for Red Blood Exchange     Status: None   Collection Time: 12/16/14  4:12 PM  Result Value Ref Range   ABO/RH(D) O POS    Antibody Screen NEG    Sample Expiration 12/19/2014   ABO/Rh     Status: None   Collection Time: 12/16/14  4:12 PM  Result Value Ref Range   ABO/RH(D) O POS   Comprehensive metabolic panel     Status: Abnormal   Collection Time: 12/16/14  4:20 PM  Result Value Ref Range   Sodium 137 135 - 145 mmol/L   Potassium 3.6 3.5 - 5.1 mmol/L   Chloride 107 96 - 112 mmol/L   CO2 22 19 - 32 mmol/L   Glucose, Bld 138 (H) 70 - 99 mg/dL   BUN 12 6 - 23 mg/dL   Creatinine, Ser 0.64 0.50 - 1.10 mg/dL   Calcium 8.4 8.4 - 10.5 mg/dL   Total Protein 6.7 6.0 - 8.3 g/dL   Albumin 3.8 3.5 - 5.2 g/dL   AST 90 (H) 0 - 37 U/L   ALT 72 (H) 0 - 35 U/L   Alkaline Phosphatase 68 39 - 117 U/L   Total Bilirubin 0.5 0.3 - 1.2 mg/dL   GFR calc non Af  Amer >90 >90 mL/min   GFR calc Af Amer >90 >90 mL/min    Comment: (NOTE) The eGFR has been calculated using the CKD EPI equation. This calculation has not been validated in all clinical situations. eGFR's persistently <90 mL/min signify possible Chronic Kidney Disease.    Anion gap 8 5 - 15  CBC     Status: Abnormal   Collection Time: 12/16/14  4:20 PM  Result Value Ref Range   WBC 16.6 (H) 4.0 - 10.5 K/uL   RBC 4.09 3.87 - 5.11 MIL/uL   Hemoglobin 13.3 12.0 - 15.0 g/dL   HCT 37.7 36.0 - 46.0 %   MCV 92.2 78.0 - 100.0 fL   MCH 32.5 26.0 - 34.0 pg   MCHC 35.3 30.0 - 36.0 g/dL   RDW 12.1 11.5 - 15.5 %   Platelets 205 150 - 400 K/uL  Ethanol     Status: None   Collection Time: 12/16/14  4:20 PM  Result Value Ref Range   Alcohol, Ethyl (B) <5 0 - 9 mg/dL    Comment:        LOWEST DETECTABLE LIMIT FOR SERUM  ALCOHOL IS 11 mg/dL FOR MEDICAL PURPOSES ONLY   Protime-INR     Status: None   Collection Time: 12/16/14  4:20 PM  Result Value Ref Range   Prothrombin Time 13.5 11.6 - 15.2 seconds   INR 1.02 0.00 - 1.49  Sample to Blood Bank     Status: None   Collection Time: 12/16/14  4:20 PM  Result Value Ref Range   Blood Bank Specimen SAMPLE AVAILABLE FOR TESTING    Sample Expiration 12/17/2014   Lipase, blood     Status: None   Collection Time: 12/16/14  4:20 PM  Result Value Ref Range   Lipase 33 11 - 59 U/L  I-Stat Chem 8, ED     Status: Abnormal   Collection Time: 12/16/14  4:26 PM  Result Value Ref Range   Sodium 141 135 - 145 mmol/L   Potassium 3.6 3.5 - 5.1 mmol/L   Chloride 105 96 - 112 mmol/L   BUN 14 6 - 23 mg/dL   Creatinine, Ser 0.70 0.50 - 1.10 mg/dL   Glucose, Bld 139 (H) 70 - 99 mg/dL   Calcium, Ion 1.12 1.12 - 1.23 mmol/L   TCO2 20 0 - 100 mmol/L   Hemoglobin 14.3 12.0 - 15.0 g/dL   HCT 42.0 36.0 - 46.0 %  I-Stat Beta hCG blood, ED ([facility], [facility], [facility] only)     Status: None   Collection Time: 12/16/14  4:55 PM  Result Value Ref Range   I-stat hCG, quantitative  <5.0 <5 mIU/mL   Comment 3            Comment:   GEST. AGE      CONC.  (mIU/mL)   <=1 WEEK        5 - 50     2 WEEKS       50 - 500     3 WEEKS       100 - 10,000     4 WEEKS     1,000 - 30,000        FEMALE AND NON-PREGNANT FEMALE:     LESS THAN 5 mIU/mL   Urinalysis, Routine w reflex microscopic     Status: Abnormal   Collection Time: 12/16/14  5:13 PM  Result Value Ref Range   Color, Urine RED (A) YELLOW    Comment: BIOCHEMICALS MAY BE AFFECTED BY COLOR   APPearance TURBID (A) CLEAR   Specific Gravity, Urine 1.014 1.005 - 1.030   pH 7.5 5.0 - 8.0   Glucose, UA NEGATIVE NEGATIVE mg/dL   Hgb urine dipstick LARGE (A) NEGATIVE   Bilirubin Urine NEGATIVE NEGATIVE   Ketones, ur NEGATIVE NEGATIVE mg/dL   Protein, ur 30 (A) NEGATIVE mg/dL   Urobilinogen, UA 0.2 0.0 - 1.0 mg/dL   Nitrite NEGATIVE NEGATIVE   Leukocytes, UA SMALL (A) NEGATIVE  Urine microscopic-add on     Status: Abnormal   Collection Time: 12/16/14  5:13 PM  Result Value Ref Range   Squamous Epithelial / LPF RARE RARE   WBC, UA 3-6 <3 WBC/hpf   RBC / HPF TOO NUMEROUS TO COUNT <3 RBC/hpf   Bacteria, UA FEW (A) RARE    Dg Lumbar Spine Complete  12/16/2014   CLINICAL DATA:  pt states that she was on a ladder cleaning windows on the second floor of her house when she fell off of the ladder and onto the ground. Low back and pelvic pain.  EXAM: LUMBAR SPINE - COMPLETE 4+ VIEW  COMPARISON:  None.  FINDINGS: There is no evidence of lumbar spine fracture. Alignment is normal. Intervertebral disc spaces are maintained.  IMPRESSION: Negative.   Electronically Signed   By: Lajean Manes M.D.   On: 12/16/2014 18:29   Dg Elbow 2 Views Left  12/16/2014   CLINICAL DATA:  34 year old female with history of trauma from a fall from a ladder approximately 1 story. Left elbow pain. Left wrist deformity.  EXAM: LEFT ELBOW - 2 VIEW  COMPARISON:  No priors.  FINDINGS: There is no evidence of fracture, dislocation, or joint effusion. Mild  irregularity of the coronoid process of the lung, presumably degenerative. Soft tissues are unremarkable.  IMPRESSION: Negative.   Electronically Signed   By: Vinnie Langton M.D.   On: 12/16/2014 18:33   Dg Forearm Left  12/16/2014   CLINICAL DATA:  Status post fall from a ladder today. Left forearm pain. Initial encounter.  EXAM: LEFT FOREARM - 2 VIEW  COMPARISON:  None.  FINDINGS: Comminuted intra-articular fracture of the distal radius is identified as seen on dedicated plain films of the hand this same day. No other acute bony or joint abnormality is seen.  IMPRESSION: Comminuted intra-articular distal radius fracture. No other acute abnormality.   Electronically Signed   By: Inge Rise M.D.   On: 12/16/2014 18:32   Dg Wrist Complete Left  12/16/2014   CLINICAL DATA:  Fall backward washing windows today 1 p.m., left wrist deformity  EXAM: LEFT WRIST - COMPLETE 3+ VIEW  COMPARISON:  None.  FINDINGS: Three views of the left wrist submitted. There is mild displaced comminuted fracture of distal left radius. Dorsal soft tissue swelling. Fracture line is extending to articular surface of distal radius.  IMPRESSION: Mild displaced comminuted fracture of distal left radius. Fracture line is involving the articular surface.   Electronically Signed   By: Lahoma Crocker M.D.   On: 12/16/2014 15:53   Ct Head Wo Contrast  12/16/2014   CLINICAL DATA:  Status post 20 foot fall from a ladder 24 hours ago. Headache and neck pain. Initial encounter.  EXAM: CT HEAD WITHOUT CONTRAST  CT CERVICAL SPINE WITHOUT CONTRAST  TECHNIQUE: Multidetector CT imaging of the head and cervical spine was performed following the standard protocol without intravenous contrast. Multiplanar CT image reconstructions of the cervical spine were also generated.  COMPARISON:  None.  FINDINGS: CT HEAD FINDINGS  The brain appears normal without hemorrhage, infarct, mass lesion, mass effect, midline shift or abnormal extra-axial fluid  collection. No hydrocephalus or pneumocephalus. The calvarium is intact.  CT CERVICAL SPINE FINDINGS  There is no fracture or malalignment cervical spine. Intervertebral disc space height is maintained. Lung apices are clear.  IMPRESSION: Negative head and cervical spine CT scans.   Electronically Signed   By: Inge Rise M.D.   On: 12/16/2014 18:18   Ct Chest W Contrast  12/16/2014   CLINICAL DATA:  34 year old female fell from ladder estimated 20 feet 4 hours ago, brief loss of consciousness, having pelvic and mid back pain, headache, neck pain, LEFT upper extremity deformity, hematuria  EXAM: CT CHEST, ABDOMEN, AND PELVIS WITH CONTRAST  TECHNIQUE: Multidetector CT imaging of the chest, abdomen and pelvis was performed following the standard protocol during bolus administration of intravenous contrast. Sagittal and coronal MPR images reconstructed from axial data set.  CONTRAST:  170m OMNIPAQUE IOHEXOL 300 MG/ML SOLN IV. Oral contrast not administered.  COMPARISON:  None  FINDINGS: CT CHEST FINDINGS  Thoracic vascular structures patent  on nondedicated exam.  No mediastinal hematoma or thoracic adenopathy.  6 mm RIGHT upper lobe pulmonary nodule image 22.  Minimal dependent atelectasis in the posterior lungs bilaterally.  No acute infiltrate, pleural effusion or pneumothorax.  No fractures identified.  CT ABDOMEN AND PELVIS FINDINGS  Beam hardening artifacts traverse the upper abdomen, including traversing the upper kidneys, suspect from patient's arms.  Liver, pancreas, kidneys, and adrenal glands normal.  Splenule adjacent to splenic hilum.  Tiny amount of perisplenic fluid lung medial aspect of spleen.  Laceration super aspect of spleen axial image 43.  Questionable additional tiny laceration or cleft at medial aspect of mid inferior spleen coronal image 53.  The superior splenic laceration extends toward but not definitely into the splenic hilum.  Unremarkable bladder, ureters, uterus, adnexa, and  appendix.  No mass, adenopathy, free air or free fluid.  Transverse process fractures LEFT L2, LEFT L3, LEFT L4, and RIGHT L5.  Fracture RIGHT sacrum.  Osteitis condensans ilii.  Limbus vertebra at L5.  Subcutaneous edema/contusion at LEFT lower lumbar region and superior to the LEFT gluteal muscles.  IMPRESSION: No acute intra thoracic abnormalities.  6 mm RIGHT upper lobe nodule, recommendation below.  Superior splenic laceration with minimal adjacent perisplenic fluid/blood.  Fractures of transverse processes at LEFT L2, LEFT L3, LEFT L4 and RIGHT L5.  Fracture RIGHT sacrum.  If the patient is at high risk for bronchogenic carcinoma, follow-up chest CT at 6-12 months is recommended. If the patient is at low risk for bronchogenic carcinoma, follow-up chest CT at 12 months is recommended. This recommendation follows the consensus statement: Guidelines for Management of Small Pulmonary Nodules Detected on CT Scans: A Statement from the [place] as published in Radiology 2005;237:395-400.  Findings called to Duffy Bruce MD on 12/16/2014 at 1834 hours.   Electronically Signed   By: Lavonia Dana M.D.   On: 12/16/2014 18:38   Ct Cervical Spine Wo Contrast  12/16/2014   CLINICAL DATA:  Status post 20 foot fall from a ladder 24 hours ago. Headache and neck pain. Initial encounter.  EXAM: CT HEAD WITHOUT CONTRAST  CT CERVICAL SPINE WITHOUT CONTRAST  TECHNIQUE: Multidetector CT imaging of the head and cervical spine was performed following the standard protocol without intravenous contrast. Multiplanar CT image reconstructions of the cervical spine were also generated.  COMPARISON:  None.  FINDINGS: CT HEAD FINDINGS  The brain appears normal without hemorrhage, infarct, mass lesion, mass effect, midline shift or abnormal extra-axial fluid collection. No hydrocephalus or pneumocephalus. The calvarium is intact.  CT CERVICAL SPINE FINDINGS  There is no fracture or malalignment cervical spine. Intervertebral  disc space height is maintained. Lung apices are clear.  IMPRESSION: Negative head and cervical spine CT scans.   Electronically Signed   By: Inge Rise M.D.   On: 12/16/2014 18:18   Ct Abdomen Pelvis W Contrast  12/16/2014   CLINICAL DATA:  34 year old female fell from ladder estimated 20 feet 4 hours ago, brief loss of consciousness, having pelvic and mid back pain, headache, neck pain, LEFT upper extremity deformity, hematuria  EXAM: CT CHEST, ABDOMEN, AND PELVIS WITH CONTRAST  TECHNIQUE: Multidetector CT imaging of the chest, abdomen and pelvis was performed following the standard protocol during bolus administration of intravenous contrast. Sagittal and coronal MPR images reconstructed from axial data set.  CONTRAST:  11m OMNIPAQUE IOHEXOL 300 MG/ML SOLN IV. Oral contrast not administered.  COMPARISON:  None  FINDINGS: CT CHEST FINDINGS  Thoracic vascular structures patent on nondedicated  exam.  No mediastinal hematoma or thoracic adenopathy.  6 mm RIGHT upper lobe pulmonary nodule image 22.  Minimal dependent atelectasis in the posterior lungs bilaterally.  No acute infiltrate, pleural effusion or pneumothorax.  No fractures identified.  CT ABDOMEN AND PELVIS FINDINGS  Beam hardening artifacts traverse the upper abdomen, including traversing the upper kidneys, suspect from patient's arms.  Liver, pancreas, kidneys, and adrenal glands normal.  Splenule adjacent to splenic hilum.  Tiny amount of perisplenic fluid lung medial aspect of spleen.  Laceration super aspect of spleen axial image 43.  Questionable additional tiny laceration or cleft at medial aspect of mid inferior spleen coronal image 53.  The superior splenic laceration extends toward but not definitely into the splenic hilum.  Unremarkable bladder, ureters, uterus, adnexa, and appendix.  No mass, adenopathy, free air or free fluid.  Transverse process fractures LEFT L2, LEFT L3, LEFT L4, and RIGHT L5.  Fracture RIGHT sacrum.  Osteitis  condensans ilii.  Limbus vertebra at L5.  Subcutaneous edema/contusion at LEFT lower lumbar region and superior to the LEFT gluteal muscles.  IMPRESSION: No acute intra thoracic abnormalities.  6 mm RIGHT upper lobe nodule, recommendation below.  Superior splenic laceration with minimal adjacent perisplenic fluid/blood.  Fractures of transverse processes at LEFT L2, LEFT L3, LEFT L4 and RIGHT L5.  Fracture RIGHT sacrum.  If the patient is at high risk for bronchogenic carcinoma, follow-up chest CT at 6-12 months is recommended. If the patient is at low risk for bronchogenic carcinoma, follow-up chest CT at 12 months is recommended. This recommendation follows the consensus statement: Guidelines for Management of Small Pulmonary Nodules Detected on CT Scans: A Statement from the [place] as published in Radiology 2005;237:395-400.  Findings called to Duffy Bruce MD on 12/16/2014 at 1834 hours.   Electronically Signed   By: Lavonia Dana M.D.   On: 12/16/2014 18:38   Dg Pelvis Portable  12/16/2014   CLINICAL DATA:  Fall.  Initial evaluation.  EXAM: PORTABLE PELVIS 1-2 VIEWS  COMPARISON:  None.  FINDINGS: There is no evidence of pelvic fracture or diastasis. No pelvic bone lesions are seen.  IMPRESSION: Negative.   Electronically Signed   By: Marcello Moores  Register   On: 12/16/2014 17:20   Dg Chest Port 1 View  12/16/2014   CLINICAL DATA:  Level 2 trauma, fell 2 stories while washing windows  EXAM: PORTABLE CHEST - 1 VIEW  COMPARISON:  Portable exam 1610 hr compared to 04/15/2009.  FINDINGS: Normal heart size, mediastinal contours, and pulmonary vascularity.  Questionable small RIGHT mid lung nodular density versus artifact.  Lungs otherwise clear.  No infiltrate, pleural effusion or pneumothorax.  No definite fractures identified.  IMPRESSION: No radiographic evidence acute injury.  Question small RIGHT mid lung nodular density versus artifact; followup upright PA and lateral chest radiographs recommended  when the patient's condition permits to exclude pulmonary nodule.   Electronically Signed   By: Lavonia Dana M.D.   On: 12/16/2014 17:20   Dg Hand Complete Left  12/16/2014   CLINICAL DATA:  Patient fell backward while washing windows  EXAM: LEFT HAND - COMPLETE 3+ VIEW  COMPARISON:  None.  FINDINGS: Frontal, oblique, and lateral views were obtained. There is a comminuted fracture of the distal radius with fracture fragments extending into the radiocarpal joint. There is dorsal angulation distal E with impaction at the fracture site. No other fractures. No dislocation. Joint spaces appear intact.  IMPRESSION: Comminuted fracture distal radius with fracture fragments extending into  the radiocarpal joint.   Electronically Signed   By: Lowella Grip III M.D.   On: 12/16/2014 15:50    ROS Blood pressure 112/72, pulse 103, temperature 98.5 F (36.9 C), temperature source Oral, resp. rate 17, height '5\' 7"'  (1.702 m), weight 72.576 kg (160 lb), last menstrual period 12/09/2014, SpO2 100 %. Physical Exam  Musculoskeletal:       Right hip: She exhibits bony tenderness.       Left hip: She exhibits bony tenderness.   She hurts in her back with straight leg extension of both legs.  She does reports pain over both sides of her pelvis with compression.  Distally, she has full foot/ankle dorsiflexion and plantar flexion (intact motor function) of both feet and reports normal sensation in both feet especially in L5 and S1 distribution on both sides.  She seems to have/reports some sensory deficit over the lateral aspect of her left leg (minimal when comparing sides)  Both lower extremities show no bruising and I can palpate along her long bones with no crepitus or deformity found. Her left wrist in splinted and her fingers are well-perfused on both hands.  Both shoulders appear well-located. She has a c-collar in place.  Assessment/Plan: Right-sided Zone I/II sacral fracture 1)  For now, she needs to be on  bed rest or at least no weight on her right leg.  Given the multiple lumbar transverse process fractures, especially the lower lumbar to the right in combination with her right-sided sacral fracture, this may represent an unstable fracture.  Her pelvis seems to be well-aligned on plain films and CT scan in terms of the pelvic ring itself and there is no gross diastasis on her SI joint on the right.  I will likely consult Dr. Marcelino Scot, Ortho Traumatologist, to review the films and make a final determination about the stability of her pelvis.  Mcarthur Rossetti 12/16/2014, 8:15 PM

## 2014-12-16 NOTE — ED Provider Notes (Signed)
CSN: [id number]     Arrival date & time 12/16/14  1430 History   First MD Initiated Contact with Patient 12/16/14 1553     No chief complaint on file. CC: Left wrist pain   (Consider location/radiation/quality/duration/timing/severity/associated sxs/prior Treatment) HPI Comments: 34 yo Hispanic female with HTN who presents with L wrist, lower back pain s/p fall from 2 stories. 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 She also reports +LOC x 1-2 minutes and is c/o mild HA. She also endorses R hip pain and has not ambulated since the event. She does have a remote h/o DVT but is no longer on Xarelto as this was a provoked DVT.  Patient is a 34 y.o. female presenting with fall.  Fall This is a new problem. The current episode started today. The problem has been gradually worsening. Associated symptoms include abdominal pain and headaches. Pertinent negatives include no chest pain, congestion, coughing, diaphoresis, nausea, neck pain, rash, sore throat, vomiting or weakness. Exacerbated by: Any movement or palpation. She has tried nothing for the symptoms. The treatment provided no relief.    Past Medical History  Diagnosis Date  . Incompetent cervix 2011   Past Surgical History  Procedure Laterality Date  . Cervical cerclage  10/16/2010  . Abdominal surgery     History reviewed. No pertinent family history. History  Substance Use Topics  . Smoking status: Light Tobacco Smoker    Types: Cigarettes  . Smokeless tobacco: Not on file     Comment: Intermittent smoker-occasional  . Alcohol Use: No   OB History    Gravida Para Term Preterm AB TAB SAB Ectopic Multiple Living   3 1 1  2   0        Review of Systems  Constitutional: Negative for diaphoresis.  HENT: Negative for congestion, dental problem and sore throat.   Eyes: Negative for visual disturbance.  Respiratory: Negative for cough, shortness of breath and wheezing.    Cardiovascular: Negative for chest pain.  Gastrointestinal: Positive for abdominal pain. Negative for nausea and vomiting.  Genitourinary: Negative for dysuria and flank pain.  Musculoskeletal: Positive for back pain and gait problem. Negative for neck pain.       L wrist pain  Skin: Negative for rash.  Neurological: Positive for syncope and headaches. Negative for weakness.  Hematological: Does not bruise/bleed easily.      Allergies  Review of patient's allergies indicates no known allergies.  Home Medications   Prior to Admission medications   Medication Sig Start Date End Date Taking? Authorizing Provider  PRESCRIPTION MEDICATION Take 1 tablet by mouth daily. "Birth Control"   Yes Historical Provider, MD  aspirin 325 MG tablet Take 325 mg by mouth daily.    Historical Provider, MD  HYDROcodone-acetaminophen (NORCO/VICODIN) 5-325 MG per tablet Take 1 tablet by mouth 2 (two) times daily as needed for moderate pain.    Historical Provider, MD  rivaroxaban (XARELTO) 20 MG TABS tablet Take 1 tablet (20 mg total) by mouth daily with supper. 06/29/14   Lorayne Marek, MD  XARELTO STARTER PACK 15 & 20 MG TBPK Take 15-20 mg by mouth as directed. Take as directed on package: Start with one 15mg  tablet by mouth twice a day with food. On Day 22, switch to one 20mg  tablet once a day with food. 05/12/14   Freddi Che, MD   BP 110/70 mmHg  Pulse 97  Temp(Src) 98.1 F (36.7 C) (Oral)  Resp 17  Ht 5\' 7"  (1.702 m)  Wt 169 lb 12.1 oz (77 kg)  BMI 26.58 kg/m2  SpO2 97%  LMP 12/09/2014 Physical Exam  Constitutional: She is oriented to person, place, and time. She appears well-developed and well-nourished. She appears distressed.  HENT:  Head: Normocephalic and atraumatic.  Mouth/Throat: No oropharyngeal exudate.  No Battle's sign. No periorbital or periauricular ecchymoses  Eyes: Conjunctivae are normal. Pupils are equal, round, and reactive to light.  Neck: No tracheal deviation present.   C-Collar in place  Cardiovascular: Normal heart sounds and intact distal pulses.  Tachycardia present.  Exam reveals no friction rub.   No murmur heard. Pulmonary/Chest: Effort normal and breath sounds normal. No respiratory distress. She has no wheezes. She has no rales. She exhibits no tenderness.  Abdominal: Soft. She exhibits no distension. There is tenderness (mild, diffuse). There is no rebound and no guarding.  Musculoskeletal: She exhibits no edema.       Left wrist: She exhibits tenderness, bony tenderness, swelling and deformity.       Thoracic back: She exhibits bony tenderness (lower midline T spine). She exhibits no swelling and no deformity.       Lumbar back: She exhibits bony tenderness (throughotu L spine). She exhibits no swelling and no deformity.  Volar angulation of wrist. Intact sensation along distal radial, ulnar, and median nerve distributions. Extension at wrist limited 2/2 pain and deformity. Radial pulse 2+.  Neurological: She is alert and oriented to person, place, and time. She has normal strength. She displays normal reflexes. No cranial nerve deficit or sensory deficit. She exhibits normal muscle tone. GCS eye subscore is 4. GCS verbal subscore is 5. GCS motor subscore is 6.  Perirectal sensation intact  Skin: Skin is warm.  Nursing note and vitals reviewed.   ED Course  Procedures (including critical care time) Labs Review Labs Reviewed  COMPREHENSIVE METABOLIC PANEL - Abnormal; Notable for the following:    Glucose, Bld 138 (*)    AST 90 (*)    ALT 72 (*)    All other components within normal limits  CBC - Abnormal; Notable for the following:    WBC 16.6 (*)    All other components within normal limits  URINALYSIS, ROUTINE W REFLEX MICROSCOPIC - Abnormal; Notable for the following:    Color, Urine RED (*)    APPearance TURBID (*)    Hgb urine dipstick LARGE (*)    Protein, ur 30 (*)    Leukocytes, UA SMALL (*)    All other components within normal  limits  URINE MICROSCOPIC-ADD ON - Abnormal; Notable for the following:    Bacteria, UA FEW (*)    All other components within normal limits  I-STAT CHEM 8, ED - Abnormal; Notable for the following:    Glucose, Bld 139 (*)    All other components within normal limits  MRSA PCR SCREENING  ETHANOL  PROTIME-INR  LIPASE, BLOOD  CK  CBC  COMPREHENSIVE METABOLIC PANEL  HEMOGLOBIN AND HEMATOCRIT, BLOOD  I-STAT BETA HCG BLOOD, ED ([facility], [facility], [facility] ONLY)  SAMPLE TO BLOOD BANK  TYPE AND SCREEN  ABO/RH    Imaging Review Dg Lumbar Spine Complete  12/16/2014   CLINICAL DATA:  pt states that she was on a ladder cleaning windows on the second floor of her house when she fell off of the ladder and onto the ground. Low back and pelvic pain.  EXAM: LUMBAR SPINE - COMPLETE 4+ VIEW  COMPARISON:  None.  FINDINGS: There is no evidence of lumbar spine fracture. Alignment is normal. Intervertebral disc spaces are maintained.  IMPRESSION: Negative.   Electronically Signed   By: Lajean Manes M.D.   On: 12/16/2014 18:29   Dg Elbow 2 Views Left  12/16/2014   CLINICAL DATA:  34 year old female with history of trauma from a fall from a ladder approximately 1 story. Left elbow pain. Left wrist deformity.  EXAM: LEFT ELBOW - 2 VIEW  COMPARISON:  No priors.  FINDINGS: There is no evidence of fracture, dislocation, or joint effusion. Mild irregularity of the coronoid process of the lung, presumably degenerative. Soft tissues are unremarkable.  IMPRESSION: Negative.   Electronically Signed   By: Vinnie Langton M.D.   On: 12/16/2014 18:33   Dg Forearm Left  12/16/2014   CLINICAL DATA:  Status post fall from a ladder today. Left forearm pain. Initial encounter.  EXAM: LEFT FOREARM - 2 VIEW  COMPARISON:  None.  FINDINGS: Comminuted intra-articular fracture of the distal radius is identified as seen on dedicated plain films of the hand this same day. No other acute bony or joint abnormality is seen.  IMPRESSION: Comminuted  intra-articular distal radius fracture. No other acute abnormality.   Electronically Signed   By: Inge Rise M.D.   On: 12/16/2014 18:32   Dg Wrist Complete Left  12/16/2014   CLINICAL DATA:  Fall backward washing windows today 1 p.m., left wrist deformity  EXAM: LEFT WRIST - COMPLETE 3+ VIEW  COMPARISON:  None.  FINDINGS: Three views of the left wrist submitted. There is mild displaced comminuted fracture of distal left radius. Dorsal soft tissue swelling. Fracture line is extending to articular surface of distal radius.  IMPRESSION: Mild displaced comminuted fracture of distal left radius. Fracture line is involving the articular surface.   Electronically Signed   By: Lahoma Crocker M.D.   On: 12/16/2014 15:53   Ct Head Wo Contrast  12/16/2014   CLINICAL DATA:  Status post 20 foot fall from a ladder 24 hours ago. Headache and neck pain. Initial encounter.  EXAM: CT HEAD WITHOUT CONTRAST  CT CERVICAL SPINE WITHOUT CONTRAST  TECHNIQUE: Multidetector CT imaging of the head and cervical spine was performed following the standard protocol without intravenous contrast. Multiplanar CT image reconstructions of the cervical spine were also generated.  COMPARISON:  None.  FINDINGS: CT HEAD FINDINGS  The brain appears normal without hemorrhage, infarct, mass lesion, mass effect, midline shift or abnormal extra-axial fluid collection. No hydrocephalus or pneumocephalus. The calvarium is intact.  CT CERVICAL SPINE FINDINGS  There is no fracture or malalignment cervical spine. Intervertebral disc space height is maintained. Lung apices are clear.  IMPRESSION: Negative head and cervical spine CT scans.   Electronically Signed   By: Inge Rise M.D.   On: 12/16/2014 18:18   Ct Chest W Contrast  12/16/2014   CLINICAL DATA:  34 year old female fell from ladder estimated 20 feet 4 hours ago, brief loss of consciousness, having pelvic and mid back pain, headache, neck pain, LEFT upper extremity deformity, hematuria   EXAM: CT CHEST, ABDOMEN, AND PELVIS WITH CONTRAST  TECHNIQUE: Multidetector CT imaging of the chest, abdomen and pelvis was performed following the standard protocol during bolus administration of intravenous contrast. Sagittal and coronal MPR images reconstructed from axial data set.  CONTRAST:  114mL OMNIPAQUE IOHEXOL 300 MG/ML SOLN IV. Oral contrast not administered.  COMPARISON:  None  FINDINGS: CT CHEST FINDINGS  Thoracic vascular structures patent on nondedicated  exam.  No mediastinal hematoma or thoracic adenopathy.  6 mm RIGHT upper lobe pulmonary nodule image 22.  Minimal dependent atelectasis in the posterior lungs bilaterally.  No acute infiltrate, pleural effusion or pneumothorax.  No fractures identified.  CT ABDOMEN AND PELVIS FINDINGS  Beam hardening artifacts traverse the upper abdomen, including traversing the upper kidneys, suspect from patient's arms.  Liver, pancreas, kidneys, and adrenal glands normal.  Splenule adjacent to splenic hilum.  Tiny amount of perisplenic fluid lung medial aspect of spleen.  Laceration super aspect of spleen axial image 43.  Questionable additional tiny laceration or cleft at medial aspect of mid inferior spleen coronal image 53.  The superior splenic laceration extends toward but not definitely into the splenic hilum.  Unremarkable bladder, ureters, uterus, adnexa, and appendix.  No mass, adenopathy, free air or free fluid.  Transverse process fractures LEFT L2, LEFT L3, LEFT L4, and RIGHT L5.  Fracture RIGHT sacrum.  Osteitis condensans ilii.  Limbus vertebra at L5.  Subcutaneous edema/contusion at LEFT lower lumbar region and superior to the LEFT gluteal muscles.  IMPRESSION: No acute intra thoracic abnormalities.  6 mm RIGHT upper lobe nodule, recommendation below.  Superior splenic laceration with minimal adjacent perisplenic fluid/blood.  Fractures of transverse processes at LEFT L2, LEFT L3, LEFT L4 and RIGHT L5.  Fracture RIGHT sacrum.  If the patient is at  high risk for bronchogenic carcinoma, follow-up chest CT at 6-12 months is recommended. If the patient is at low risk for bronchogenic carcinoma, follow-up chest CT at 12 months is recommended. This recommendation follows the consensus statement: Guidelines for Management of Small Pulmonary Nodules Detected on CT Scans: A Statement from the [place] as published in Radiology 2005;237:395-400.  Findings called to Duffy Bruce MD on 12/16/2014 at 1834 hours.   Electronically Signed   By: Lavonia Dana M.D.   On: 12/16/2014 18:38   Ct Cervical Spine Wo Contrast  12/16/2014   CLINICAL DATA:  Status post 20 foot fall from a ladder 24 hours ago. Headache and neck pain. Initial encounter.  EXAM: CT HEAD WITHOUT CONTRAST  CT CERVICAL SPINE WITHOUT CONTRAST  TECHNIQUE: Multidetector CT imaging of the head and cervical spine was performed following the standard protocol without intravenous contrast. Multiplanar CT image reconstructions of the cervical spine were also generated.  COMPARISON:  None.  FINDINGS: CT HEAD FINDINGS  The brain appears normal without hemorrhage, infarct, mass lesion, mass effect, midline shift or abnormal extra-axial fluid collection. No hydrocephalus or pneumocephalus. The calvarium is intact.  CT CERVICAL SPINE FINDINGS  There is no fracture or malalignment cervical spine. Intervertebral disc space height is maintained. Lung apices are clear.  IMPRESSION: Negative head and cervical spine CT scans.   Electronically Signed   By: Inge Rise M.D.   On: 12/16/2014 18:18   Ct Abdomen Pelvis W Contrast  12/16/2014   CLINICAL DATA:  34 year old female fell from ladder estimated 20 feet 4 hours ago, brief loss of consciousness, having pelvic and mid back pain, headache, neck pain, LEFT upper extremity deformity, hematuria  EXAM: CT CHEST, ABDOMEN, AND PELVIS WITH CONTRAST  TECHNIQUE: Multidetector CT imaging of the chest, abdomen and pelvis was performed following the standard protocol  during bolus administration of intravenous contrast. Sagittal and coronal MPR images reconstructed from axial data set.  CONTRAST:  140mL OMNIPAQUE IOHEXOL 300 MG/ML SOLN IV. Oral contrast not administered.  COMPARISON:  None  FINDINGS: CT CHEST FINDINGS  Thoracic vascular structures patent on nondedicated exam.  No mediastinal hematoma or thoracic adenopathy.  6 mm RIGHT upper lobe pulmonary nodule image 22.  Minimal dependent atelectasis in the posterior lungs bilaterally.  No acute infiltrate, pleural effusion or pneumothorax.  No fractures identified.  CT ABDOMEN AND PELVIS FINDINGS  Beam hardening artifacts traverse the upper abdomen, including traversing the upper kidneys, suspect from patient's arms.  Liver, pancreas, kidneys, and adrenal glands normal.  Splenule adjacent to splenic hilum.  Tiny amount of perisplenic fluid lung medial aspect of spleen.  Laceration super aspect of spleen axial image 43.  Questionable additional tiny laceration or cleft at medial aspect of mid inferior spleen coronal image 53.  The superior splenic laceration extends toward but not definitely into the splenic hilum.  Unremarkable bladder, ureters, uterus, adnexa, and appendix.  No mass, adenopathy, free air or free fluid.  Transverse process fractures LEFT L2, LEFT L3, LEFT L4, and RIGHT L5.  Fracture RIGHT sacrum.  Osteitis condensans ilii.  Limbus vertebra at L5.  Subcutaneous edema/contusion at LEFT lower lumbar region and superior to the LEFT gluteal muscles.  IMPRESSION: No acute intra thoracic abnormalities.  6 mm RIGHT upper lobe nodule, recommendation below.  Superior splenic laceration with minimal adjacent perisplenic fluid/blood.  Fractures of transverse processes at LEFT L2, LEFT L3, LEFT L4 and RIGHT L5.  Fracture RIGHT sacrum.  If the patient is at high risk for bronchogenic carcinoma, follow-up chest CT at 6-12 months is recommended. If the patient is at low risk for bronchogenic carcinoma, follow-up chest CT at  12 months is recommended. This recommendation follows the consensus statement: Guidelines for Management of Small Pulmonary Nodules Detected on CT Scans: A Statement from the [place] as published in Radiology 2005;237:395-400.  Findings called to Duffy Bruce MD on 12/16/2014 at 1834 hours.   Electronically Signed   By: Lavonia Dana M.D.   On: 12/16/2014 18:38   Dg Pelvis Portable  12/16/2014   CLINICAL DATA:  Fall.  Initial evaluation.  EXAM: PORTABLE PELVIS 1-2 VIEWS  COMPARISON:  None.  FINDINGS: There is no evidence of pelvic fracture or diastasis. No pelvic bone lesions are seen.  IMPRESSION: Negative.   Electronically Signed   By: Marcello Moores  Register   On: 12/16/2014 17:20   Dg Chest Port 1 View  12/16/2014   CLINICAL DATA:  Level 2 trauma, fell 2 stories while washing windows  EXAM: PORTABLE CHEST - 1 VIEW  COMPARISON:  Portable exam 1610 hr compared to 04/15/2009.  FINDINGS: Normal heart size, mediastinal contours, and pulmonary vascularity.  Questionable small RIGHT mid lung nodular density versus artifact.  Lungs otherwise clear.  No infiltrate, pleural effusion or pneumothorax.  No definite fractures identified.  IMPRESSION: No radiographic evidence acute injury.  Question small RIGHT mid lung nodular density versus artifact; followup upright PA and lateral chest radiographs recommended when the patient's condition permits to exclude pulmonary nodule.   Electronically Signed   By: Lavonia Dana M.D.   On: 12/16/2014 17:20   Dg Hand Complete Left  12/16/2014   CLINICAL DATA:  Patient fell backward while washing windows  EXAM: LEFT HAND - COMPLETE 3+ VIEW  COMPARISON:  None.  FINDINGS: Frontal, oblique, and lateral views were obtained. There is a comminuted fracture of the distal radius with fracture fragments extending into the radiocarpal joint. There is dorsal angulation distal E with impaction at the fracture site. No other fractures. No dislocation. Joint spaces appear intact.   IMPRESSION: Comminuted fracture distal radius with fracture fragments extending into the radiocarpal  joint.   Electronically Signed   By: Lowella Grip III M.D.   On: 12/16/2014 15:50     EKG Interpretation None      MDM    34 yo Hispanic female with h/o provoked DVT (now s/o completed course of Xarelto) who presents with left wrist pain s/p fall. Pt initially reported L wrist pain only in Waiting Room, for which plain films revealed comminuted L distal radius fx. However, family member arrived upon rooming in ED and reported 72 foot (2-story) fall with + LOC. Pt subsequently upgraded to L2TC. Primary survey intact, and IV access x 2 established without difficulty. GCS 15. Secondary as above. Pt taken immediately from secondary to CT scanner for full scans. Pt HDS at this time. Abdomen is soft, NT, and ND. Labs sent. Will c/s ortho hand while awaiting trauma eval.  Labs as above. CBC with WBC 16.6, reactive 2/2 trauma, with Hgb 13.3. INR 1.02. LFTs mildly elevated at 90 and 72, c/f possible liver contusion. CK 704, c/w fall from 20 feet. VSS. Awaiting CT results.  Full trauma scans completed. CT Head, C-Spine unremarkable. CT C/A/P with grade II splenic lac, L1-L5 left TP fx, R sacral ala fx. I have discussed with Trauma, who will admit, as well as General Ortho who wil make recs re: sacral fx. Of note, UA with gross hematuria - no perineal or straddle injury on exam. Will need to monitor. No active extrav per Radiology. Pt to be admitted to SDU.  Clinical Impression: 1. Fall, initial encounter   2. Contusion   3. Distal radius fracture, left, closed, initial encounter   4. Lumbar transverse process fracture, closed, initial encounter   5. Fracture of sacrum, closed, initial encounter   6. Splenic laceration, initial encounter   7. Leukocytosis   8. Hematuria   9. Elevated LFTs     Disposition: Admit  Condition: Stable  Pt seen in conjunction with Dr. Gertie Fey, MD 12/17/14 1157  Duffy Bruce, MD 12/17/14 [phone]

## 2014-12-16 NOTE — Progress Notes (Signed)
Orthopedic Tech Progress Note Patient Details:  Hailey Walsh 1980/11/21 [id number]  Ortho Devices Type of Ortho Device: Ace wrap, Sugartong splint Ortho Device/Splint Location: LUE Ortho Device/Splint Interventions: Ordered, Application   Braulio Bosch 12/16/2014, 7:46 PM

## 2014-12-16 NOTE — ED Notes (Signed)
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

## 2014-12-16 NOTE — Consult Note (Signed)
Reason for Consult: Comminuted left distal radius fracture Referring Physician: Emergency room staff  Hailey Walsh is an 34 y.o. female.  HPI: 64 UL female status post fall proximally 20 feet with comment the left distal raised fracture.  She has a spleen injury being managed by trauma surgery. She has a pelvic issue being managed by Dr. Rush Farmer.  I discussed her injury with she and her husband this past interpreter. I discussed her the risk and benefits of treatment. At present time I do feel she needs aggressive treatment for her left wrist.  She complains of abdominal pain. Her husband at bedside.  She denies prior history of fracture. She notes no history of instability about her wrist or hand.  Past Medical History  Diagnosis Date  . Incompetent cervix 2011    Past Surgical History  Procedure Laterality Date  . Cervical cerclage  10/16/2010  . Abdominal surgery      History reviewed. No pertinent family history.  Social History:  reports that she has been smoking Cigarettes.  She does not have any smokeless tobacco history on file. She reports that she does not drink alcohol or use illicit drugs.  Allergies: No Known Allergies  Medications: I have reviewed the patient's current medications.  Results for orders placed or performed during the hospital encounter of 12/16/14 (from the past 48 hour(s))  Type and screen for Red Blood Exchange     Status: None   Collection Time: 12/16/14  4:12 PM  Result Value Ref Range   ABO/RH(D) O POS    Antibody Screen NEG    Sample Expiration 12/19/2014   ABO/Rh     Status: None   Collection Time: 12/16/14  4:12 PM  Result Value Ref Range   ABO/RH(D) O POS   Comprehensive metabolic panel     Status: Abnormal   Collection Time: 12/16/14  4:20 PM  Result Value Ref Range   Sodium 137 135 - 145 mmol/L   Potassium 3.6 3.5 - 5.1 mmol/L   Chloride 107 96 - 112 mmol/L   CO2 22 19 - 32 mmol/L   Glucose, Bld 138 (H) 70 - 99  mg/dL   BUN 12 6 - 23 mg/dL   Creatinine, Ser 0.64 0.50 - 1.10 mg/dL   Calcium 8.4 8.4 - 10.5 mg/dL   Total Protein 6.7 6.0 - 8.3 g/dL   Albumin 3.8 3.5 - 5.2 g/dL   AST 90 (H) 0 - 37 U/L   ALT 72 (H) 0 - 35 U/L   Alkaline Phosphatase 68 39 - 117 U/L   Total Bilirubin 0.5 0.3 - 1.2 mg/dL   GFR calc non Af Amer >90 >90 mL/min   GFR calc Af Amer >90 >90 mL/min    Comment: (NOTE) The eGFR has been calculated using the CKD EPI equation. This calculation has not been validated in all clinical situations. eGFR's persistently <90 mL/min signify possible Chronic Kidney Disease.    Anion gap 8 5 - 15  CBC     Status: Abnormal   Collection Time: 12/16/14  4:20 PM  Result Value Ref Range   WBC 16.6 (H) 4.0 - 10.5 K/uL   RBC 4.09 3.87 - 5.11 MIL/uL   Hemoglobin 13.3 12.0 - 15.0 g/dL   HCT 37.7 36.0 - 46.0 %   MCV 92.2 78.0 - 100.0 fL   MCH 32.5 26.0 - 34.0 pg   MCHC 35.3 30.0 - 36.0 g/dL   RDW 12.1 11.5 - 15.5 %   Platelets  205 150 - 400 K/uL  Ethanol     Status: None   Collection Time: 12/16/14  4:20 PM  Result Value Ref Range   Alcohol, Ethyl (B) <5 0 - 9 mg/dL    Comment:        LOWEST DETECTABLE LIMIT FOR SERUM ALCOHOL IS 11 mg/dL FOR MEDICAL PURPOSES ONLY   Protime-INR     Status: None   Collection Time: 12/16/14  4:20 PM  Result Value Ref Range   Prothrombin Time 13.5 11.6 - 15.2 seconds   INR 1.02 0.00 - 1.49  Sample to Blood Bank     Status: None   Collection Time: 12/16/14  4:20 PM  Result Value Ref Range   Blood Bank Specimen SAMPLE AVAILABLE FOR TESTING    Sample Expiration 12/17/2014   Lipase, blood     Status: None   Collection Time: 12/16/14  4:20 PM  Result Value Ref Range   Lipase 33 11 - 59 U/L  I-Stat Chem 8, ED     Status: Abnormal   Collection Time: 12/16/14  4:26 PM  Result Value Ref Range   Sodium 141 135 - 145 mmol/L   Potassium 3.6 3.5 - 5.1 mmol/L   Chloride 105 96 - 112 mmol/L   BUN 14 6 - 23 mg/dL   Creatinine, Ser 0.70 0.50 - 1.10 mg/dL    Glucose, Bld 139 (H) 70 - 99 mg/dL   Calcium, Ion 1.12 1.12 - 1.23 mmol/L   TCO2 20 0 - 100 mmol/L   Hemoglobin 14.3 12.0 - 15.0 g/dL   HCT 42.0 36.0 - 46.0 %  I-Stat Beta hCG blood, ED ([facility], [facility], [facility] only)     Status: None   Collection Time: 12/16/14  4:55 PM  Result Value Ref Range   I-stat hCG, quantitative <5.0 <5 mIU/mL   Comment 3            Comment:   GEST. AGE      CONC.  (mIU/mL)   <=1 WEEK        5 - 50     2 WEEKS       50 - 500     3 WEEKS       100 - 10,000     4 WEEKS     1,000 - 30,000        FEMALE AND NON-PREGNANT FEMALE:     LESS THAN 5 mIU/mL   Urinalysis, Routine w reflex microscopic     Status: Abnormal   Collection Time: 12/16/14  5:13 PM  Result Value Ref Range   Color, Urine RED (A) YELLOW    Comment: BIOCHEMICALS MAY BE AFFECTED BY COLOR   APPearance TURBID (A) CLEAR   Specific Gravity, Urine 1.014 1.005 - 1.030   pH 7.5 5.0 - 8.0   Glucose, UA NEGATIVE NEGATIVE mg/dL   Hgb urine dipstick LARGE (A) NEGATIVE   Bilirubin Urine NEGATIVE NEGATIVE   Ketones, ur NEGATIVE NEGATIVE mg/dL   Protein, ur 30 (A) NEGATIVE mg/dL   Urobilinogen, UA 0.2 0.0 - 1.0 mg/dL   Nitrite NEGATIVE NEGATIVE   Leukocytes, UA SMALL (A) NEGATIVE  Urine microscopic-add on     Status: Abnormal   Collection Time: 12/16/14  5:13 PM  Result Value Ref Range   Squamous Epithelial / LPF RARE RARE   WBC, UA 3-6 <3 WBC/hpf   RBC / HPF TOO NUMEROUS TO COUNT <3 RBC/hpf   Bacteria, UA FEW (A) RARE    Dg  Lumbar Spine Complete  12/16/2014   CLINICAL DATA:  pt states that she was on a ladder cleaning windows on the second floor of her house when she fell off of the ladder and onto the ground. Low back and pelvic pain.  EXAM: LUMBAR SPINE - COMPLETE 4+ VIEW  COMPARISON:  None.  FINDINGS: There is no evidence of lumbar spine fracture. Alignment is normal. Intervertebral disc spaces are maintained.  IMPRESSION: Negative.   Electronically Signed   By: Lajean Manes M.D.   On: 12/16/2014 18:29   Dg  Elbow 2 Views Left  12/16/2014   CLINICAL DATA:  34 year old female with history of trauma from a fall from a ladder approximately 1 story. Left elbow pain. Left wrist deformity.  EXAM: LEFT ELBOW - 2 VIEW  COMPARISON:  No priors.  FINDINGS: There is no evidence of fracture, dislocation, or joint effusion. Mild irregularity of the coronoid process of the lung, presumably degenerative. Soft tissues are unremarkable.  IMPRESSION: Negative.   Electronically Signed   By: Vinnie Langton M.D.   On: 12/16/2014 18:33   Dg Forearm Left  12/16/2014   CLINICAL DATA:  Status post fall from a ladder today. Left forearm pain. Initial encounter.  EXAM: LEFT FOREARM - 2 VIEW  COMPARISON:  None.  FINDINGS: Comminuted intra-articular fracture of the distal radius is identified as seen on dedicated plain films of the hand this same day. No other acute bony or joint abnormality is seen.  IMPRESSION: Comminuted intra-articular distal radius fracture. No other acute abnormality.   Electronically Signed   By: Inge Rise M.D.   On: 12/16/2014 18:32   Dg Wrist Complete Left  12/16/2014   CLINICAL DATA:  Fall backward washing windows today 1 p.m., left wrist deformity  EXAM: LEFT WRIST - COMPLETE 3+ VIEW  COMPARISON:  None.  FINDINGS: Three views of the left wrist submitted. There is mild displaced comminuted fracture of distal left radius. Dorsal soft tissue swelling. Fracture line is extending to articular surface of distal radius.  IMPRESSION: Mild displaced comminuted fracture of distal left radius. Fracture line is involving the articular surface.   Electronically Signed   By: Lahoma Crocker M.D.   On: 12/16/2014 15:53   Ct Head Wo Contrast  12/16/2014   CLINICAL DATA:  Status post 20 foot fall from a ladder 24 hours ago. Headache and neck pain. Initial encounter.  EXAM: CT HEAD WITHOUT CONTRAST  CT CERVICAL SPINE WITHOUT CONTRAST  TECHNIQUE: Multidetector CT imaging of the head and cervical spine was performed following  the standard protocol without intravenous contrast. Multiplanar CT image reconstructions of the cervical spine were also generated.  COMPARISON:  None.  FINDINGS: CT HEAD FINDINGS  The brain appears normal without hemorrhage, infarct, mass lesion, mass effect, midline shift or abnormal extra-axial fluid collection. No hydrocephalus or pneumocephalus. The calvarium is intact.  CT CERVICAL SPINE FINDINGS  There is no fracture or malalignment cervical spine. Intervertebral disc space height is maintained. Lung apices are clear.  IMPRESSION: Negative head and cervical spine CT scans.   Electronically Signed   By: Inge Rise M.D.   On: 12/16/2014 18:18   Ct Chest W Contrast  12/16/2014   CLINICAL DATA:  34 year old female fell from ladder estimated 20 feet 4 hours ago, brief loss of consciousness, having pelvic and mid back pain, headache, neck pain, LEFT upper extremity deformity, hematuria  EXAM: CT CHEST, ABDOMEN, AND PELVIS WITH CONTRAST  TECHNIQUE: Multidetector CT imaging of the chest, abdomen  and pelvis was performed following the standard protocol during bolus administration of intravenous contrast. Sagittal and coronal MPR images reconstructed from axial data set.  CONTRAST:  151m OMNIPAQUE IOHEXOL 300 MG/ML SOLN IV. Oral contrast not administered.  COMPARISON:  None  FINDINGS: CT CHEST FINDINGS  Thoracic vascular structures patent on nondedicated exam.  No mediastinal hematoma or thoracic adenopathy.  6 mm RIGHT upper lobe pulmonary nodule image 22.  Minimal dependent atelectasis in the posterior lungs bilaterally.  No acute infiltrate, pleural effusion or pneumothorax.  No fractures identified.  CT ABDOMEN AND PELVIS FINDINGS  Beam hardening artifacts traverse the upper abdomen, including traversing the upper kidneys, suspect from patient's arms.  Liver, pancreas, kidneys, and adrenal glands normal.  Splenule adjacent to splenic hilum.  Tiny amount of perisplenic fluid lung medial aspect of spleen.   Laceration super aspect of spleen axial image 43.  Questionable additional tiny laceration or cleft at medial aspect of mid inferior spleen coronal image 53.  The superior splenic laceration extends toward but not definitely into the splenic hilum.  Unremarkable bladder, ureters, uterus, adnexa, and appendix.  No mass, adenopathy, free air or free fluid.  Transverse process fractures LEFT L2, LEFT L3, LEFT L4, and RIGHT L5.  Fracture RIGHT sacrum.  Osteitis condensans ilii.  Limbus vertebra at L5.  Subcutaneous edema/contusion at LEFT lower lumbar region and superior to the LEFT gluteal muscles.  IMPRESSION: No acute intra thoracic abnormalities.  6 mm RIGHT upper lobe nodule, recommendation below.  Superior splenic laceration with minimal adjacent perisplenic fluid/blood.  Fractures of transverse processes at LEFT L2, LEFT L3, LEFT L4 and RIGHT L5.  Fracture RIGHT sacrum.  If the patient is at high risk for bronchogenic carcinoma, follow-up chest CT at 6-12 months is recommended. If the patient is at low risk for bronchogenic carcinoma, follow-up chest CT at 12 months is recommended. This recommendation follows the consensus statement: Guidelines for Management of Small Pulmonary Nodules Detected on CT Scans: A Statement from the [place] published in Radiology 2005;237:395-400.  Findings called to CDuffy BruceMD on 12/16/2014 at 1834 hours.   Electronically Signed   By: MLavonia DanaM.D.   On: 12/16/2014 18:38   Ct Cervical Spine Wo Contrast  12/16/2014   CLINICAL DATA:  Status post 20 foot fall from a ladder 24 hours ago. Headache and neck pain. Initial encounter.  EXAM: CT HEAD WITHOUT CONTRAST  CT CERVICAL SPINE WITHOUT CONTRAST  TECHNIQUE: Multidetector CT imaging of the head and cervical spine was performed following the standard protocol without intravenous contrast. Multiplanar CT image reconstructions of the cervical spine were also generated.  COMPARISON:  None.  FINDINGS: CT HEAD  FINDINGS  The brain appears normal without hemorrhage, infarct, mass lesion, mass effect, midline shift or abnormal extra-axial fluid collection. No hydrocephalus or pneumocephalus. The calvarium is intact.  CT CERVICAL SPINE FINDINGS  There is no fracture or malalignment cervical spine. Intervertebral disc space height is maintained. Lung apices are clear.  IMPRESSION: Negative head and cervical spine CT scans.   Electronically Signed   By: TInge RiseM.D.   On: 12/16/2014 18:18   Ct Abdomen Pelvis W Contrast  12/16/2014   CLINICAL DATA:  34year old female fell from ladder estimated 20 feet 4 hours ago, brief loss of consciousness, having pelvic and mid back pain, headache, neck pain, LEFT upper extremity deformity, hematuria  EXAM: CT CHEST, ABDOMEN, AND PELVIS WITH CONTRAST  TECHNIQUE: Multidetector CT imaging of the chest, abdomen and pelvis  was performed following the standard protocol during bolus administration of intravenous contrast. Sagittal and coronal MPR images reconstructed from axial data set.  CONTRAST:  130m OMNIPAQUE IOHEXOL 300 MG/ML SOLN IV. Oral contrast not administered.  COMPARISON:  None  FINDINGS: CT CHEST FINDINGS  Thoracic vascular structures patent on nondedicated exam.  No mediastinal hematoma or thoracic adenopathy.  6 mm RIGHT upper lobe pulmonary nodule image 22.  Minimal dependent atelectasis in the posterior lungs bilaterally.  No acute infiltrate, pleural effusion or pneumothorax.  No fractures identified.  CT ABDOMEN AND PELVIS FINDINGS  Beam hardening artifacts traverse the upper abdomen, including traversing the upper kidneys, suspect from patient's arms.  Liver, pancreas, kidneys, and adrenal glands normal.  Splenule adjacent to splenic hilum.  Tiny amount of perisplenic fluid lung medial aspect of spleen.  Laceration super aspect of spleen axial image 43.  Questionable additional tiny laceration or cleft at medial aspect of mid inferior spleen coronal image 53.  The  superior splenic laceration extends toward but not definitely into the splenic hilum.  Unremarkable bladder, ureters, uterus, adnexa, and appendix.  No mass, adenopathy, free air or free fluid.  Transverse process fractures LEFT L2, LEFT L3, LEFT L4, and RIGHT L5.  Fracture RIGHT sacrum.  Osteitis condensans ilii.  Limbus vertebra at L5.  Subcutaneous edema/contusion at LEFT lower lumbar region and superior to the LEFT gluteal muscles.  IMPRESSION: No acute intra thoracic abnormalities.  6 mm RIGHT upper lobe nodule, recommendation below.  Superior splenic laceration with minimal adjacent perisplenic fluid/blood.  Fractures of transverse processes at LEFT L2, LEFT L3, LEFT L4 and RIGHT L5.  Fracture RIGHT sacrum.  If the patient is at high risk for bronchogenic carcinoma, follow-up chest CT at 6-12 months is recommended. If the patient is at low risk for bronchogenic carcinoma, follow-up chest CT at 12 months is recommended. This recommendation follows the consensus statement: Guidelines for Management of Small Pulmonary Nodules Detected on CT Scans: A Statement from the [place] published in Radiology 2005;237:395-400.  Findings called to CDuffy BruceMD on 12/16/2014 at 1834 hours.   Electronically Signed   By: MLavonia DanaM.D.   On: 12/16/2014 18:38   Dg Pelvis Portable  12/16/2014   CLINICAL DATA:  Fall.  Initial evaluation.  EXAM: PORTABLE PELVIS 1-2 VIEWS  COMPARISON:  None.  FINDINGS: There is no evidence of pelvic fracture or diastasis. No pelvic bone lesions are seen.  IMPRESSION: Negative.   Electronically Signed   By: TMarcello Moores Register   On: 12/16/2014 17:20   Dg Chest Port 1 View  12/16/2014   CLINICAL DATA:  Level 2 trauma, fell 2 stories while washing windows  EXAM: PORTABLE CHEST - 1 VIEW  COMPARISON:  Portable exam 1610 hr compared to 04/15/2009.  FINDINGS: Normal heart size, mediastinal contours, and pulmonary vascularity.  Questionable small RIGHT mid lung nodular density versus  artifact.  Lungs otherwise clear.  No infiltrate, pleural effusion or pneumothorax.  No definite fractures identified.  IMPRESSION: No radiographic evidence acute injury.  Question small RIGHT mid lung nodular density versus artifact; followup upright PA and lateral chest radiographs recommended when the patient's condition permits to exclude pulmonary nodule.   Electronically Signed   By: MLavonia DanaM.D.   On: 12/16/2014 17:20   Dg Hand Complete Left  12/16/2014   CLINICAL DATA:  Patient fell backward while washing windows  EXAM: LEFT HAND - COMPLETE 3+ VIEW  COMPARISON:  None.  FINDINGS: Frontal, oblique, and lateral  views were obtained. There is a comminuted fracture of the distal radius with fracture fragments extending into the radiocarpal joint. There is dorsal angulation distal E with impaction at the fracture site. No other fractures. No dislocation. Joint spaces appear intact.  IMPRESSION: Comminuted fracture distal radius with fracture fragments extending into the radiocarpal joint.   Electronically Signed   By: Lowella Grip III M.D.   On: 12/16/2014 15:50    Review of Systems  Constitutional: Negative.   HENT: Negative.   Eyes: Negative.   Cardiovascular: Negative.   Genitourinary: Negative.   Neurological: Negative.   Psychiatric/Behavioral: Negative.    Blood pressure 109/67, pulse 98, temperature 98.5 F (36.9 C), temperature source Oral, resp. rate 18, height '5\' 7"'  (1.702 m), weight 72.576 kg (160 lb), last menstrual period 12/09/2014, SpO2 99 %. Physical Exam Patient has a left comminuted complex distal radius fracture. She is now splinted. She is sensate in her radial median and ulnar nerve distribution. She has intact refill. No evidence of compartment syndrome. She is generally painful with finger motion however.  Her shoulders slightly sore and we will get x-rays  She denies neck pain.  She moves her toes well she is sensate in her lower extremities. She complains of  some pain in her abdomen. She denies shortness of breath. Right upper extremity has positive IV access no evidence of fracture dislocation or space behind lesion. She denies right arm pain.  I reviewed her x-rays at length she has a comminuted complex highly displaced distal radius fracture which will require surgical fixation. Assessment/Plan: Left wrist comminuted comp once fracture with displacement and intra-articular nature grade and 5 part.  She'll require open reduction internal fixation with bone grafting is necessary. I discussed her the risk and benefits timeframe duration of recovery.  We will await clearance and be ready to proceed with ORIF.  I discussed these issues with her through a Spanish interpreter on the phone with her husband present. Consent was signed we are ready for surgical fixation once she is stable.  We'll perform this at [facility] convenient time in the future.  She is a nice lady we'll try to do everything to give her the best wrist possible however I would certainly expect some degree of disability long-term given the comminution  Denice Bors M 12/16/2014, 8:37 PM

## 2014-12-16 NOTE — ED Notes (Signed)
Pt taken to CT and xray with this RN and Charlett Nose tech on the monitor.

## 2014-12-16 NOTE — ED Notes (Signed)
Dr Priscella Mann at bedside.

## 2014-12-16 NOTE — Progress Notes (Signed)
Orthopedic Tech Progress Note Patient Details:  Hailey Walsh [birth year] [id number] Level 2 trauma. Patient presents with LUE wrist deformity. ED MD to hold off on any splinting for now to get Ortho/hand MD opinion on whether or not wrist must be reduced. ED RN to call Ortho tech back once decision is made and staff is ready. Patient ID: Hailey Walsh, female   DOB: 1981-03-15, 34 y.o.   MRN: [id number]   [name] 12/16/2014, 4:27 PM

## 2014-12-16 NOTE — ED Notes (Signed)
Ortho tech at bedside 

## 2014-12-16 NOTE — ED Notes (Signed)
Mini - lab is running poc hcg. Ct notified.

## 2014-12-16 NOTE — ED Notes (Signed)
Informed Dr Earnest Conroy and Dr Ellender Hose that pt's urine was red and pt states that she is not on her menstrual cycle.

## 2014-12-16 NOTE — H&P (Addendum)
History   Hailey Walsh is an 34 y.o. female.   Chief Complaint: No chief complaint on file.   HPI Pt fell 20 ft off ladder earlier today no LOC no HOTN after CT scan done made level 2 trauma activation Complains of left wrist pain had a littlle LUQ abdominal pain.  Denies neck pain has had a headache.  Was cleaning windows and fell off ladder.  Speaks a little english husband is able to communicate / translate.  On xaralto for previous DVT after ortho injury last august.  Past Medical History  Diagnosis Date  . Incompetent cervix 2011    Past Surgical History  Procedure Laterality Date  . Cervical cerclage  10/16/2010  . Abdominal surgery      History reviewed. No pertinent family history. Social History:  reports that she has been smoking Cigarettes.  She does not have any smokeless tobacco history on file. She reports that she does not drink alcohol or use illicit drugs.  Allergies  No Known Allergies  Home Medications   (Not in a hospital admission)  Trauma Course   Results for orders placed or performed during the hospital encounter of 12/16/14 (from the past 48 hour(s))  Type and screen for Red Blood Exchange     Status: None   Collection Time: 12/16/14  4:12 PM  Result Value Ref Range   ABO/RH(D) O POS    Antibody Screen NEG    Sample Expiration 12/19/2014   ABO/Rh     Status: None   Collection Time: 12/16/14  4:12 PM  Result Value Ref Range   ABO/RH(D) O POS   Comprehensive metabolic panel     Status: Abnormal   Collection Time: 12/16/14  4:20 PM  Result Value Ref Range   Sodium 137 135 - 145 mmol/L   Potassium 3.6 3.5 - 5.1 mmol/L   Chloride 107 96 - 112 mmol/L   CO2 22 19 - 32 mmol/L   Glucose, Bld 138 (H) 70 - 99 mg/dL   BUN 12 6 - 23 mg/dL   Creatinine, Ser 0.64 0.50 - 1.10 mg/dL   Calcium 8.4 8.4 - 10.5 mg/dL   Total Protein 6.7 6.0 - 8.3 g/dL   Albumin 3.8 3.5 - 5.2 g/dL   AST 90 (H) 0 - 37 U/L   ALT 72 (H) 0 - 35 U/L   Alkaline  Phosphatase 68 39 - 117 U/L   Total Bilirubin 0.5 0.3 - 1.2 mg/dL   GFR calc non Af Amer >90 >90 mL/min   GFR calc Af Amer >90 >90 mL/min    Comment: (NOTE) The eGFR has been calculated using the CKD EPI equation. This calculation has not been validated in all clinical situations. eGFR's persistently <90 mL/min signify possible Chronic Kidney Disease.    Anion gap 8 5 - 15  CBC     Status: Abnormal   Collection Time: 12/16/14  4:20 PM  Result Value Ref Range   WBC 16.6 (H) 4.0 - 10.5 K/uL   RBC 4.09 3.87 - 5.11 MIL/uL   Hemoglobin 13.3 12.0 - 15.0 g/dL   HCT 37.7 36.0 - 46.0 %   MCV 92.2 78.0 - 100.0 fL   MCH 32.5 26.0 - 34.0 pg   MCHC 35.3 30.0 - 36.0 g/dL   RDW 12.1 11.5 - 15.5 %   Platelets 205 150 - 400 K/uL  Ethanol     Status: None   Collection Time: 12/16/14  4:20 PM  Result Value Ref Range  Alcohol, Ethyl (B) <5 0 - 9 mg/dL    Comment:        LOWEST DETECTABLE LIMIT FOR SERUM ALCOHOL IS 11 mg/dL FOR MEDICAL PURPOSES ONLY   Protime-INR     Status: None   Collection Time: 12/16/14  4:20 PM  Result Value Ref Range   Prothrombin Time 13.5 11.6 - 15.2 seconds   INR 1.02 0.00 - 1.49  Sample to Blood Bank     Status: None   Collection Time: 12/16/14  4:20 PM  Result Value Ref Range   Blood Bank Specimen SAMPLE AVAILABLE FOR TESTING    Sample Expiration 12/17/2014   Lipase, blood     Status: None   Collection Time: 12/16/14  4:20 PM  Result Value Ref Range   Lipase 33 11 - 59 U/L  I-Stat Chem 8, ED     Status: Abnormal   Collection Time: 12/16/14  4:26 PM  Result Value Ref Range   Sodium 141 135 - 145 mmol/L   Potassium 3.6 3.5 - 5.1 mmol/L   Chloride 105 96 - 112 mmol/L   BUN 14 6 - 23 mg/dL   Creatinine, Ser 0.70 0.50 - 1.10 mg/dL   Glucose, Bld 139 (H) 70 - 99 mg/dL   Calcium, Ion 1.12 1.12 - 1.23 mmol/L   TCO2 20 0 - 100 mmol/L   Hemoglobin 14.3 12.0 - 15.0 g/dL   HCT 42.0 36.0 - 46.0 %  I-Stat Beta hCG blood, ED ([facility], [facility], [facility] only)     Status: None    Collection Time: 12/16/14  4:55 PM  Result Value Ref Range   I-stat hCG, quantitative <5.0 <5 mIU/mL   Comment 3            Comment:   GEST. AGE      CONC.  (mIU/mL)   <=1 WEEK        5 - 50     2 WEEKS       50 - 500     3 WEEKS       100 - 10,000     4 WEEKS     1,000 - 30,000        FEMALE AND NON-PREGNANT FEMALE:     LESS THAN 5 mIU/mL   Urinalysis, Routine w reflex microscopic     Status: Abnormal   Collection Time: 12/16/14  5:13 PM  Result Value Ref Range   Color, Urine RED (A) YELLOW    Comment: BIOCHEMICALS MAY BE AFFECTED BY COLOR   APPearance TURBID (A) CLEAR   Specific Gravity, Urine 1.014 1.005 - 1.030   pH 7.5 5.0 - 8.0   Glucose, UA NEGATIVE NEGATIVE mg/dL   Hgb urine dipstick LARGE (A) NEGATIVE   Bilirubin Urine NEGATIVE NEGATIVE   Ketones, ur NEGATIVE NEGATIVE mg/dL   Protein, ur 30 (A) NEGATIVE mg/dL   Urobilinogen, UA 0.2 0.0 - 1.0 mg/dL   Nitrite NEGATIVE NEGATIVE   Leukocytes, UA SMALL (A) NEGATIVE  Urine microscopic-add on     Status: Abnormal   Collection Time: 12/16/14  5:13 PM  Result Value Ref Range   Squamous Epithelial / LPF RARE RARE   WBC, UA 3-6 <3 WBC/hpf   RBC / HPF TOO NUMEROUS TO COUNT <3 RBC/hpf   Bacteria, UA FEW (A) RARE   Dg Lumbar Spine Complete  12/16/2014   CLINICAL DATA:  pt states that she was on a ladder cleaning windows on the second floor of her house when she  fell off of the ladder and onto the ground. Low back and pelvic pain.  EXAM: LUMBAR SPINE - COMPLETE 4+ VIEW  COMPARISON:  None.  FINDINGS: There is no evidence of lumbar spine fracture. Alignment is normal. Intervertebral disc spaces are maintained.  IMPRESSION: Negative.   Electronically Signed   By: Lajean Manes M.D.   On: 12/16/2014 18:29   Dg Elbow 2 Views Left  12/16/2014   CLINICAL DATA:  34 year old female with history of trauma from a fall from a ladder approximately 1 story. Left elbow pain. Left wrist deformity.  EXAM: LEFT ELBOW - 2 VIEW  COMPARISON:  No priors.   FINDINGS: There is no evidence of fracture, dislocation, or joint effusion. Mild irregularity of the coronoid process of the lung, presumably degenerative. Soft tissues are unremarkable.  IMPRESSION: Negative.   Electronically Signed   By: Vinnie Langton M.D.   On: 12/16/2014 18:33   Dg Forearm Left  12/16/2014   CLINICAL DATA:  Status post fall from a ladder today. Left forearm pain. Initial encounter.  EXAM: LEFT FOREARM - 2 VIEW  COMPARISON:  None.  FINDINGS: Comminuted intra-articular fracture of the distal radius is identified as seen on dedicated plain films of the hand this same day. No other acute bony or joint abnormality is seen.  IMPRESSION: Comminuted intra-articular distal radius fracture. No other acute abnormality.   Electronically Signed   By: Inge Rise M.D.   On: 12/16/2014 18:32   Dg Wrist Complete Left  12/16/2014   CLINICAL DATA:  Fall backward washing windows today 1 p.m., left wrist deformity  EXAM: LEFT WRIST - COMPLETE 3+ VIEW  COMPARISON:  None.  FINDINGS: Three views of the left wrist submitted. There is mild displaced comminuted fracture of distal left radius. Dorsal soft tissue swelling. Fracture line is extending to articular surface of distal radius.  IMPRESSION: Mild displaced comminuted fracture of distal left radius. Fracture line is involving the articular surface.   Electronically Signed   By: Lahoma Crocker M.D.   On: 12/16/2014 15:53   Ct Head Wo Contrast  12/16/2014   CLINICAL DATA:  Status post 20 foot fall from a ladder 24 hours ago. Headache and neck pain. Initial encounter.  EXAM: CT HEAD WITHOUT CONTRAST  CT CERVICAL SPINE WITHOUT CONTRAST  TECHNIQUE: Multidetector CT imaging of the head and cervical spine was performed following the standard protocol without intravenous contrast. Multiplanar CT image reconstructions of the cervical spine were also generated.  COMPARISON:  None.  FINDINGS: CT HEAD FINDINGS  The brain appears normal without hemorrhage, infarct,  mass lesion, mass effect, midline shift or abnormal extra-axial fluid collection. No hydrocephalus or pneumocephalus. The calvarium is intact.  CT CERVICAL SPINE FINDINGS  There is no fracture or malalignment cervical spine. Intervertebral disc space height is maintained. Lung apices are clear.  IMPRESSION: Negative head and cervical spine CT scans.   Electronically Signed   By: Inge Rise M.D.   On: 12/16/2014 18:18   Ct Chest W Contrast  12/16/2014   CLINICAL DATA:  34 year old female fell from ladder estimated 20 feet 4 hours ago, brief loss of consciousness, having pelvic and mid back pain, headache, neck pain, LEFT upper extremity deformity, hematuria  EXAM: CT CHEST, ABDOMEN, AND PELVIS WITH CONTRAST  TECHNIQUE: Multidetector CT imaging of the chest, abdomen and pelvis was performed following the standard protocol during bolus administration of intravenous contrast. Sagittal and coronal MPR images reconstructed from axial data set.  CONTRAST:  163m OMNIPAQUE  IOHEXOL 300 MG/ML SOLN IV. Oral contrast not administered.  COMPARISON:  None  FINDINGS: CT CHEST FINDINGS  Thoracic vascular structures patent on nondedicated exam.  No mediastinal hematoma or thoracic adenopathy.  6 mm RIGHT upper lobe pulmonary nodule image 22.  Minimal dependent atelectasis in the posterior lungs bilaterally.  No acute infiltrate, pleural effusion or pneumothorax.  No fractures identified.  CT ABDOMEN AND PELVIS FINDINGS  Beam hardening artifacts traverse the upper abdomen, including traversing the upper kidneys, suspect from patient's arms.  Liver, pancreas, kidneys, and adrenal glands normal.  Splenule adjacent to splenic hilum.  Tiny amount of perisplenic fluid lung medial aspect of spleen.  Laceration super aspect of spleen axial image 43.  Questionable additional tiny laceration or cleft at medial aspect of mid inferior spleen coronal image 53.  The superior splenic laceration extends toward but not definitely into the  splenic hilum.  Unremarkable bladder, ureters, uterus, adnexa, and appendix.  No mass, adenopathy, free air or free fluid.  Transverse process fractures LEFT L2, LEFT L3, LEFT L4, and RIGHT L5.  Fracture RIGHT sacrum.  Osteitis condensans ilii.  Limbus vertebra at L5.  Subcutaneous edema/contusion at LEFT lower lumbar region and superior to the LEFT gluteal muscles.  IMPRESSION: No acute intra thoracic abnormalities.  6 mm RIGHT upper lobe nodule, recommendation below.  Superior splenic laceration with minimal adjacent perisplenic fluid/blood.  Fractures of transverse processes at LEFT L2, LEFT L3, LEFT L4 and RIGHT L5.  Fracture RIGHT sacrum.  If the patient is at high risk for bronchogenic carcinoma, follow-up chest CT at 6-12 months is recommended. If the patient is at low risk for bronchogenic carcinoma, follow-up chest CT at 12 months is recommended. This recommendation follows the consensus statement: Guidelines for Management of Small Pulmonary Nodules Detected on CT Scans: A Statement from the [place] as published in Radiology 2005;237:395-400.  Findings called to Duffy Bruce MD on 12/16/2014 at 1834 hours.   Electronically Signed   By: Lavonia Dana M.D.   On: 12/16/2014 18:38   Ct Cervical Spine Wo Contrast  12/16/2014   CLINICAL DATA:  Status post 20 foot fall from a ladder 24 hours ago. Headache and neck pain. Initial encounter.  EXAM: CT HEAD WITHOUT CONTRAST  CT CERVICAL SPINE WITHOUT CONTRAST  TECHNIQUE: Multidetector CT imaging of the head and cervical spine was performed following the standard protocol without intravenous contrast. Multiplanar CT image reconstructions of the cervical spine were also generated.  COMPARISON:  None.  FINDINGS: CT HEAD FINDINGS  The brain appears normal without hemorrhage, infarct, mass lesion, mass effect, midline shift or abnormal extra-axial fluid collection. No hydrocephalus or pneumocephalus. The calvarium is intact.  CT CERVICAL SPINE FINDINGS   There is no fracture or malalignment cervical spine. Intervertebral disc space height is maintained. Lung apices are clear.  IMPRESSION: Negative head and cervical spine CT scans.   Electronically Signed   By: Inge Rise M.D.   On: 12/16/2014 18:18   Ct Abdomen Pelvis W Contrast  12/16/2014   CLINICAL DATA:  34 year old female fell from ladder estimated 20 feet 4 hours ago, brief loss of consciousness, having pelvic and mid back pain, headache, neck pain, LEFT upper extremity deformity, hematuria  EXAM: CT CHEST, ABDOMEN, AND PELVIS WITH CONTRAST  TECHNIQUE: Multidetector CT imaging of the chest, abdomen and pelvis was performed following the standard protocol during bolus administration of intravenous contrast. Sagittal and coronal MPR images reconstructed from axial data set.  CONTRAST:  144m OMNIPAQUE IOHEXOL 300  MG/ML SOLN IV. Oral contrast not administered.  COMPARISON:  None  FINDINGS: CT CHEST FINDINGS  Thoracic vascular structures patent on nondedicated exam.  No mediastinal hematoma or thoracic adenopathy.  6 mm RIGHT upper lobe pulmonary nodule image 22.  Minimal dependent atelectasis in the posterior lungs bilaterally.  No acute infiltrate, pleural effusion or pneumothorax.  No fractures identified.  CT ABDOMEN AND PELVIS FINDINGS  Beam hardening artifacts traverse the upper abdomen, including traversing the upper kidneys, suspect from patient's arms.  Liver, pancreas, kidneys, and adrenal glands normal.  Splenule adjacent to splenic hilum.  Tiny amount of perisplenic fluid lung medial aspect of spleen.  Laceration super aspect of spleen axial image 43.  Questionable additional tiny laceration or cleft at medial aspect of mid inferior spleen coronal image 53.  The superior splenic laceration extends toward but not definitely into the splenic hilum.  Unremarkable bladder, ureters, uterus, adnexa, and appendix.  No mass, adenopathy, free air or free fluid.  Transverse process fractures LEFT L2,  LEFT L3, LEFT L4, and RIGHT L5.  Fracture RIGHT sacrum.  Osteitis condensans ilii.  Limbus vertebra at L5.  Subcutaneous edema/contusion at LEFT lower lumbar region and superior to the LEFT gluteal muscles.  IMPRESSION: No acute intra thoracic abnormalities.  6 mm RIGHT upper lobe nodule, recommendation below.  Superior splenic laceration with minimal adjacent perisplenic fluid/blood.  Fractures of transverse processes at LEFT L2, LEFT L3, LEFT L4 and RIGHT L5.  Fracture RIGHT sacrum.  If the patient is at high risk for bronchogenic carcinoma, follow-up chest CT at 6-12 months is recommended. If the patient is at low risk for bronchogenic carcinoma, follow-up chest CT at 12 months is recommended. This recommendation follows the consensus statement: Guidelines for Management of Small Pulmonary Nodules Detected on CT Scans: A Statement from the [place] as published in Radiology 2005;237:395-400.  Findings called to Duffy Bruce MD on 12/16/2014 at 1834 hours.   Electronically Signed   By: Lavonia Dana M.D.   On: 12/16/2014 18:38   Dg Pelvis Portable  12/16/2014   CLINICAL DATA:  Fall.  Initial evaluation.  EXAM: PORTABLE PELVIS 1-2 VIEWS  COMPARISON:  None.  FINDINGS: There is no evidence of pelvic fracture or diastasis. No pelvic bone lesions are seen.  IMPRESSION: Negative.   Electronically Signed   By: Marcello Moores  Register   On: 12/16/2014 17:20   Dg Chest Port 1 View  12/16/2014   CLINICAL DATA:  Level 2 trauma, fell 2 stories while washing windows  EXAM: PORTABLE CHEST - 1 VIEW  COMPARISON:  Portable exam 1610 hr compared to 04/15/2009.  FINDINGS: Normal heart size, mediastinal contours, and pulmonary vascularity.  Questionable small RIGHT mid lung nodular density versus artifact.  Lungs otherwise clear.  No infiltrate, pleural effusion or pneumothorax.  No definite fractures identified.  IMPRESSION: No radiographic evidence acute injury.  Question small RIGHT mid lung nodular density versus  artifact; followup upright PA and lateral chest radiographs recommended when the patient's condition permits to exclude pulmonary nodule.   Electronically Signed   By: Lavonia Dana M.D.   On: 12/16/2014 17:20   Dg Hand Complete Left  12/16/2014   CLINICAL DATA:  Patient fell backward while washing windows  EXAM: LEFT HAND - COMPLETE 3+ VIEW  COMPARISON:  None.  FINDINGS: Frontal, oblique, and lateral views were obtained. There is a comminuted fracture of the distal radius with fracture fragments extending into the radiocarpal joint. There is dorsal angulation distal E with impaction at  the fracture site. No other fractures. No dislocation. Joint spaces appear intact.  IMPRESSION: Comminuted fracture distal radius with fracture fragments extending into the radiocarpal joint.   Electronically Signed   By: Lowella Grip III M.D.   On: 12/16/2014 15:50    Review of Systems  Constitutional: Negative for fever and chills.  Eyes: Negative.   Respiratory: Negative for cough.   Cardiovascular: Negative for chest pain.  Gastrointestinal: Positive for abdominal pain.  Musculoskeletal: Positive for back pain and falls. Negative for neck pain.  Skin: Negative.   Neurological: Positive for headaches.  Psychiatric/Behavioral: Negative.     Blood pressure 109/67, pulse 98, temperature 98.5 F (36.9 C), temperature source Oral, resp. rate 18, height '5\' 7"'  (1.702 m), weight 160 lb (72.576 kg), last menstrual period 12/09/2014, SpO2 99 %. Physical Exam  Constitutional: She is oriented to person, place, and time. She appears well-developed and well-nourished.  HENT:  Head: Normocephalic and atraumatic.  Eyes: EOM are normal. Pupils are equal, round, and reactive to light.  Neck: Normal range of motion. Neck supple.  Non tender FROM without pain c collar removed.   GI: Soft. There is tenderness in the left upper quadrant. There is no rigidity and no guarding.  Musculoskeletal: Normal range of motion.   Left wrist splinted  Warm good cap refill  Neurological: She is alert and oriented to person, place, and time. GCS eye subscore is 4. GCS verbal subscore is 5. GCS motor subscore is 6.  Skin: Skin is warm and dry.     Assessment/Plan Fall 20 ft  Left distal radius fracture  Per hand service ok to fix tonight if needed Grade 2 splenic laceration  Observe in step down Right sacral fracture per ortho  Transverse process fracture L2,L3,L4 R 5   No treatment needed Cervical spine clear radiographically and clinically Follow H/H  On xaralto  For DVT.  Will hold this.  No active bleeding and timing of fall not clear  Yesterday vs today. No significant fluid around spleen and no HOTN.  Not sure she is still taking  Jatavious Peppard A. 12/16/2014, 9:08 PM   Procedures

## 2014-12-16 NOTE — ED Notes (Signed)
Pt presents with obvious deformity to L wrist and back pain after falling onto floor x 30 minutes ago.  Pt denies hitting head.

## 2014-12-16 NOTE — ED Notes (Signed)
Family reports that pt lost consciousness for 1-2 minutes.

## 2014-12-16 NOTE — ED Provider Notes (Addendum)
I saw and evaluated the patient, reviewed the resident's note and I agree with the findings and plan.   EKG Interpretation None      Results for orders placed or performed during the hospital encounter of 12/16/14  Comprehensive metabolic panel  Result Value Ref Range   Sodium 137 135 - 145 mmol/L   Potassium 3.6 3.5 - 5.1 mmol/L   Chloride 107 96 - 112 mmol/L   CO2 22 19 - 32 mmol/L   Glucose, Bld 138 (H) 70 - 99 mg/dL   BUN 12 6 - 23 mg/dL   Creatinine, Ser 0.64 0.50 - 1.10 mg/dL   Calcium 8.4 8.4 - 10.5 mg/dL   Total Protein 6.7 6.0 - 8.3 g/dL   Albumin 3.8 3.5 - 5.2 g/dL   AST 90 (H) 0 - 37 U/L   ALT 72 (H) 0 - 35 U/L   Alkaline Phosphatase 68 39 - 117 U/L   Total Bilirubin 0.5 0.3 - 1.2 mg/dL   GFR calc non Af Amer >90 >90 mL/min   GFR calc Af Amer >90 >90 mL/min   Anion gap 8 5 - 15  CBC  Result Value Ref Range   WBC 16.6 (H) 4.0 - 10.5 K/uL   RBC 4.09 3.87 - 5.11 MIL/uL   Hemoglobin 13.3 12.0 - 15.0 g/dL   HCT 37.7 36.0 - 46.0 %   MCV 92.2 78.0 - 100.0 fL   MCH 32.5 26.0 - 34.0 pg   MCHC 35.3 30.0 - 36.0 g/dL   RDW 12.1 11.5 - 15.5 %   Platelets 205 150 - 400 K/uL  Ethanol  Result Value Ref Range   Alcohol, Ethyl (B) <5 0 - 9 mg/dL  Protime-INR  Result Value Ref Range   Prothrombin Time 13.5 11.6 - 15.2 seconds   INR 1.02 0.00 - 1.49  Lipase, blood  Result Value Ref Range   Lipase 33 11 - 59 U/L  Urinalysis, Routine w reflex microscopic  Result Value Ref Range   Color, Urine RED (A) YELLOW   APPearance TURBID (A) CLEAR   Specific Gravity, Urine 1.014 1.005 - 1.030   pH 7.5 5.0 - 8.0   Glucose, UA NEGATIVE NEGATIVE mg/dL   Hgb urine dipstick LARGE (A) NEGATIVE   Bilirubin Urine NEGATIVE NEGATIVE   Ketones, ur NEGATIVE NEGATIVE mg/dL   Protein, ur 30 (A) NEGATIVE mg/dL   Urobilinogen, UA 0.2 0.0 - 1.0 mg/dL   Nitrite NEGATIVE NEGATIVE   Leukocytes, UA SMALL (A) NEGATIVE  Urine microscopic-add on  Result Value Ref Range   Squamous Epithelial / LPF  RARE RARE   WBC, UA 3-6 <3 WBC/hpf   RBC / HPF TOO NUMEROUS TO COUNT <3 RBC/hpf   Bacteria, UA FEW (A) RARE  I-Stat Chem 8, ED  Result Value Ref Range   Sodium 141 135 - 145 mmol/L   Potassium 3.6 3.5 - 5.1 mmol/L   Chloride 105 96 - 112 mmol/L   BUN 14 6 - 23 mg/dL   Creatinine, Ser 0.70 0.50 - 1.10 mg/dL   Glucose, Bld 139 (H) 70 - 99 mg/dL   Calcium, Ion 1.12 1.12 - 1.23 mmol/L   TCO2 20 0 - 100 mmol/L   Hemoglobin 14.3 12.0 - 15.0 g/dL   HCT 42.0 36.0 - 46.0 %  I-Stat Beta hCG blood, ED ([facility], [facility], [facility] only)  Result Value Ref Range   I-stat hCG, quantitative <5.0 <5 mIU/mL   Comment 3  Sample to Blood Bank  Result Value Ref Range   Blood Bank Specimen SAMPLE AVAILABLE FOR TESTING    Sample Expiration 12/17/2014   Type and screen for Red Blood Exchange  Result Value Ref Range   ABO/RH(D) O POS    Antibody Screen PENDING    Sample Expiration 12/19/2014    Dg Lumbar Spine Complete  12/16/2014   CLINICAL DATA:  pt states that she was on a ladder cleaning windows on the second floor of her house when she fell off of the ladder and onto the ground. Low back and pelvic pain.  EXAM: LUMBAR SPINE - COMPLETE 4+ VIEW  COMPARISON:  None.  FINDINGS: There is no evidence of lumbar spine fracture. Alignment is normal. Intervertebral disc spaces are maintained.  IMPRESSION: Negative.   Electronically Signed   By: Lajean Manes M.D.   On: 12/16/2014 18:29   Dg Elbow 2 Views Left  12/16/2014   CLINICAL DATA:  34 year old female with history of trauma from a fall from a ladder approximately 1 story. Left elbow pain. Left wrist deformity.  EXAM: LEFT ELBOW - 2 VIEW  COMPARISON:  No priors.  FINDINGS: There is no evidence of fracture, dislocation, or joint effusion. Mild irregularity of the coronoid process of the lung, presumably degenerative. Soft tissues are unremarkable.  IMPRESSION: Negative.   Electronically Signed   By: Vinnie Langton M.D.   On: 12/16/2014 18:33   Dg Forearm  Left  12/16/2014   CLINICAL DATA:  Status post fall from a ladder today. Left forearm pain. Initial encounter.  EXAM: LEFT FOREARM - 2 VIEW  COMPARISON:  None.  FINDINGS: Comminuted intra-articular fracture of the distal radius is identified as seen on dedicated plain films of the hand this same day. No other acute bony or joint abnormality is seen.  IMPRESSION: Comminuted intra-articular distal radius fracture. No other acute abnormality.   Electronically Signed   By: Inge Rise M.D.   On: 12/16/2014 18:32   Dg Wrist Complete Left  12/16/2014   CLINICAL DATA:  Fall backward washing windows today 1 p.m., left wrist deformity  EXAM: LEFT WRIST - COMPLETE 3+ VIEW  COMPARISON:  None.  FINDINGS: Three views of the left wrist submitted. There is mild displaced comminuted fracture of distal left radius. Dorsal soft tissue swelling. Fracture line is extending to articular surface of distal radius.  IMPRESSION: Mild displaced comminuted fracture of distal left radius. Fracture line is involving the articular surface.   Electronically Signed   By: Lahoma Crocker M.D.   On: 12/16/2014 15:53   Ct Head Wo Contrast  12/16/2014   CLINICAL DATA:  Status post 20 foot fall from a ladder 24 hours ago. Headache and neck pain. Initial encounter.  EXAM: CT HEAD WITHOUT CONTRAST  CT CERVICAL SPINE WITHOUT CONTRAST  TECHNIQUE: Multidetector CT imaging of the head and cervical spine was performed following the standard protocol without intravenous contrast. Multiplanar CT image reconstructions of the cervical spine were also generated.  COMPARISON:  None.  FINDINGS: CT HEAD FINDINGS  The brain appears normal without hemorrhage, infarct, mass lesion, mass effect, midline shift or abnormal extra-axial fluid collection. No hydrocephalus or pneumocephalus. The calvarium is intact.  CT CERVICAL SPINE FINDINGS  There is no fracture or malalignment cervical spine. Intervertebral disc space height is maintained. Lung apices are clear.   IMPRESSION: Negative head and cervical spine CT scans.   Electronically Signed   By: Inge Rise M.D.   On: 12/16/2014 18:18  Ct Chest W Contrast  12/16/2014   CLINICAL DATA:  34 year old female fell from ladder estimated 20 feet 4 hours ago, brief loss of consciousness, having pelvic and mid back pain, headache, neck pain, LEFT upper extremity deformity, hematuria  EXAM: CT CHEST, ABDOMEN, AND PELVIS WITH CONTRAST  TECHNIQUE: Multidetector CT imaging of the chest, abdomen and pelvis was performed following the standard protocol during bolus administration of intravenous contrast. Sagittal and coronal MPR images reconstructed from axial data set.  CONTRAST:  154mL OMNIPAQUE IOHEXOL 300 MG/ML SOLN IV. Oral contrast not administered.  COMPARISON:  None  FINDINGS: CT CHEST FINDINGS  Thoracic vascular structures patent on nondedicated exam.  No mediastinal hematoma or thoracic adenopathy.  6 mm RIGHT upper lobe pulmonary nodule image 22.  Minimal dependent atelectasis in the posterior lungs bilaterally.  No acute infiltrate, pleural effusion or pneumothorax.  No fractures identified.  CT ABDOMEN AND PELVIS FINDINGS  Beam hardening artifacts traverse the upper abdomen, including traversing the upper kidneys, suspect from patient's arms.  Liver, pancreas, kidneys, and adrenal glands normal.  Splenule adjacent to splenic hilum.  Tiny amount of perisplenic fluid lung medial aspect of spleen.  Laceration super aspect of spleen axial image 43.  Questionable additional tiny laceration or cleft at medial aspect of mid inferior spleen coronal image 53.  The superior splenic laceration extends toward but not definitely into the splenic hilum.  Unremarkable bladder, ureters, uterus, adnexa, and appendix.  No mass, adenopathy, free air or free fluid.  Transverse process fractures LEFT L2, LEFT L3, LEFT L4, and RIGHT L5.  Fracture RIGHT sacrum.  Osteitis condensans ilii.  Limbus vertebra at L5.  Subcutaneous edema/contusion  at LEFT lower lumbar region and superior to the LEFT gluteal muscles.  IMPRESSION: No acute intra thoracic abnormalities.  6 mm RIGHT upper lobe nodule, recommendation below.  Superior splenic laceration with minimal adjacent perisplenic fluid/blood.  Fractures of transverse processes at LEFT L2, LEFT L3, LEFT L4 and RIGHT L5.  Fracture RIGHT sacrum.  If the patient is at high risk for bronchogenic carcinoma, follow-up chest CT at 6-12 months is recommended. If the patient is at low risk for bronchogenic carcinoma, follow-up chest CT at 12 months is recommended. This recommendation follows the consensus statement: Guidelines for Management of Small Pulmonary Nodules Detected on CT Scans: A Statement from the [place] as published in Radiology 2005;237:395-400.  Findings called to Duffy Bruce MD on 12/16/2014 at 1834 hours.   Electronically Signed   By: Lavonia Dana M.D.   On: 12/16/2014 18:38   Ct Cervical Spine Wo Contrast  12/16/2014   CLINICAL DATA:  Status post 20 foot fall from a ladder 24 hours ago. Headache and neck pain. Initial encounter.  EXAM: CT HEAD WITHOUT CONTRAST  CT CERVICAL SPINE WITHOUT CONTRAST  TECHNIQUE: Multidetector CT imaging of the head and cervical spine was performed following the standard protocol without intravenous contrast. Multiplanar CT image reconstructions of the cervical spine were also generated.  COMPARISON:  None.  FINDINGS: CT HEAD FINDINGS  The brain appears normal without hemorrhage, infarct, mass lesion, mass effect, midline shift or abnormal extra-axial fluid collection. No hydrocephalus or pneumocephalus. The calvarium is intact.  CT CERVICAL SPINE FINDINGS  There is no fracture or malalignment cervical spine. Intervertebral disc space height is maintained. Lung apices are clear.  IMPRESSION: Negative head and cervical spine CT scans.   Electronically Signed   By: Inge Rise M.D.   On: 12/16/2014 18:18   Ct Abdomen Pelvis W  Contrast  12/16/2014    CLINICAL DATA:  34 year old female fell from ladder estimated 20 feet 4 hours ago, brief loss of consciousness, having pelvic and mid back pain, headache, neck pain, LEFT upper extremity deformity, hematuria  EXAM: CT CHEST, ABDOMEN, AND PELVIS WITH CONTRAST  TECHNIQUE: Multidetector CT imaging of the chest, abdomen and pelvis was performed following the standard protocol during bolus administration of intravenous contrast. Sagittal and coronal MPR images reconstructed from axial data set.  CONTRAST:  162mL OMNIPAQUE IOHEXOL 300 MG/ML SOLN IV. Oral contrast not administered.  COMPARISON:  None  FINDINGS: CT CHEST FINDINGS  Thoracic vascular structures patent on nondedicated exam.  No mediastinal hematoma or thoracic adenopathy.  6 mm RIGHT upper lobe pulmonary nodule image 22.  Minimal dependent atelectasis in the posterior lungs bilaterally.  No acute infiltrate, pleural effusion or pneumothorax.  No fractures identified.  CT ABDOMEN AND PELVIS FINDINGS  Beam hardening artifacts traverse the upper abdomen, including traversing the upper kidneys, suspect from patient's arms.  Liver, pancreas, kidneys, and adrenal glands normal.  Splenule adjacent to splenic hilum.  Tiny amount of perisplenic fluid lung medial aspect of spleen.  Laceration super aspect of spleen axial image 43.  Questionable additional tiny laceration or cleft at medial aspect of mid inferior spleen coronal image 53.  The superior splenic laceration extends toward but not definitely into the splenic hilum.  Unremarkable bladder, ureters, uterus, adnexa, and appendix.  No mass, adenopathy, free air or free fluid.  Transverse process fractures LEFT L2, LEFT L3, LEFT L4, and RIGHT L5.  Fracture RIGHT sacrum.  Osteitis condensans ilii.  Limbus vertebra at L5.  Subcutaneous edema/contusion at LEFT lower lumbar region and superior to the LEFT gluteal muscles.  IMPRESSION: No acute intra thoracic abnormalities.  6 mm RIGHT upper lobe nodule, recommendation  below.  Superior splenic laceration with minimal adjacent perisplenic fluid/blood.  Fractures of transverse processes at LEFT L2, LEFT L3, LEFT L4 and RIGHT L5.  Fracture RIGHT sacrum.  If the patient is at high risk for bronchogenic carcinoma, follow-up chest CT at 6-12 months is recommended. If the patient is at low risk for bronchogenic carcinoma, follow-up chest CT at 12 months is recommended. This recommendation follows the consensus statement: Guidelines for Management of Small Pulmonary Nodules Detected on CT Scans: A Statement from the [place] as published in Radiology 2005;237:395-400.  Findings called to Duffy Bruce MD on 12/16/2014 at 1834 hours.   Electronically Signed   By: Lavonia Dana M.D.   On: 12/16/2014 18:38   Dg Pelvis Portable  12/16/2014   CLINICAL DATA:  Fall.  Initial evaluation.  EXAM: PORTABLE PELVIS 1-2 VIEWS  COMPARISON:  None.  FINDINGS: There is no evidence of pelvic fracture or diastasis. No pelvic bone lesions are seen.  IMPRESSION: Negative.   Electronically Signed   By: Marcello Moores  Register   On: 12/16/2014 17:20   Dg Chest Port 1 View  12/16/2014   CLINICAL DATA:  Level 2 trauma, fell 2 stories while washing windows  EXAM: PORTABLE CHEST - 1 VIEW  COMPARISON:  Portable exam 1610 hr compared to 04/15/2009.  FINDINGS: Normal heart size, mediastinal contours, and pulmonary vascularity.  Questionable small RIGHT mid lung nodular density versus artifact.  Lungs otherwise clear.  No infiltrate, pleural effusion or pneumothorax.  No definite fractures identified.  IMPRESSION: No radiographic evidence acute injury.  Question small RIGHT mid lung nodular density versus artifact; followup upright PA and lateral chest radiographs recommended when the patient's condition permits  to exclude pulmonary nodule.   Electronically Signed   By: Lavonia Dana M.D.   On: 12/16/2014 17:20   Dg Hand Complete Left  12/16/2014   CLINICAL DATA:  Patient fell backward while washing windows   EXAM: LEFT HAND - COMPLETE 3+ VIEW  COMPARISON:  None.  FINDINGS: Frontal, oblique, and lateral views were obtained. There is a comminuted fracture of the distal radius with fracture fragments extending into the radiocarpal joint. There is dorsal angulation distal E with impaction at the fracture site. No other fractures. No dislocation. Joint spaces appear intact.  IMPRESSION: Comminuted fracture distal radius with fracture fragments extending into the radiocarpal joint.   Electronically Signed   By: Lowella Grip III M.D.   On: 12/16/2014 15:50    CRITICAL CARE Performed by: Fredia Sorrow Total critical care time: 30 Critical care time was exclusive of separately billable procedures and treating other patients. Critical care was necessary to treat or prevent imminent or life-threatening deterioration. Critical care was time spent personally by me on the following activities: development of treatment plan with patient and/or surrogate as well as nursing, discussions with consultants, evaluation of patient's response to treatment, examination of patient, obtaining history from patient or surrogate, ordering and performing treatments and interventions, ordering and review of laboratory studies, ordering and review of radiographic studies, pulse oximetry and re-evaluation of patient's condition.   Patient brought in by family members. Initial confusion about what the mechanism of injury was. Eventually determined that patient had actually fallen from a ladder from the second floor window. Patient had obvious distracting injury to the left wrist which had deformity. Was does show a comminuted fracture. Patient went back and x-ray realized that she had a lot of back pain concerns were raised and it was figured out that the mechanism of injury was fairly significant. Patient was made a level II trauma brought to the emergency department room 32. Patient not hypotensive blood pressure below 100  originally. Patient had gross hematuria. CT scan shows a splenic laceration without intra-abdominal blood. Kidneys appeared normal on CT. But there must be at least a contusion is Vilma gross hematuria. Patient also has several transverse lumbar vertebrae fractures. Patient will require admission by trauma service. In addition patient had sacral fracture. Orthopedics consult it as well. Patient received pain treatment. The left arm will be splinted.  On exam lungs were clear heart regular rate and rhythm without murmurs, abdomen had some tenderness more to the left side. Tenderness to the lumbar part of the back. Patient mentating fine Glasgow Coma Scale normal.   Fredia Sorrow, MD 12/16/14 1912  Patient will be admitted by the trauma service. Hand service also called for the distal radius fracture.  Fredia Sorrow, MD 12/16/14 1946

## 2014-12-16 NOTE — ED Notes (Addendum)
Dr Gramig at bedside. 

## 2014-12-16 NOTE — ED Notes (Signed)
Call radiology to inform that pt is ready for exams.

## 2014-12-17 ENCOUNTER — Inpatient Hospital Stay (HOSPITAL_COMMUNITY): Payer: Medicaid Other

## 2014-12-17 ENCOUNTER — Encounter (HOSPITAL_COMMUNITY): Admission: EM | Disposition: A | Discharge status: Home / Self Care | Payer: Self-pay | Source: Home / Self Care

## 2014-12-17 ENCOUNTER — Encounter (HOSPITAL_COMMUNITY): Payer: Self-pay | Admitting: Anesthesiology

## 2014-12-17 ENCOUNTER — Inpatient Hospital Stay (HOSPITAL_COMMUNITY): Payer: Medicaid Other | Admitting: Anesthesiology

## 2014-12-17 DIAGNOSIS — S62102A Fracture of unspecified carpal bone, left wrist, initial encounter for closed fracture: Secondary | ICD-10-CM | POA: Diagnosis present

## 2014-12-17 DIAGNOSIS — S32009A Unspecified fracture of unspecified lumbar vertebra, initial encounter for closed fracture: Secondary | ICD-10-CM | POA: Diagnosis present

## 2014-12-17 DIAGNOSIS — S3210XA Unspecified fracture of sacrum, initial encounter for closed fracture: Secondary | ICD-10-CM | POA: Diagnosis present

## 2014-12-17 DIAGNOSIS — W19XXXA Unspecified fall, initial encounter: Secondary | ICD-10-CM | POA: Diagnosis present

## 2014-12-17 HISTORY — PX: ORIF WRIST FRACTURE: SHX2133

## 2014-12-17 HISTORY — PX: SACRO-ILIAC PINNING: SHX5050

## 2014-12-17 LAB — CBC
HEMATOCRIT: 32.3 % — AB (ref 36.0–46.0)
Hemoglobin: 11.3 g/dL — ABNORMAL LOW (ref 12.0–15.0)
MCH: 32.9 pg (ref 26.0–34.0)
MCHC: 35 g/dL (ref 30.0–36.0)
MCV: 94.2 fL (ref 78.0–100.0)
PLATELETS: 171 10*3/uL (ref 150–400)
RBC: 3.43 MIL/uL — AB (ref 3.87–5.11)
RDW: 12.3 % (ref 11.5–15.5)
WBC: 7.8 10*3/uL (ref 4.0–10.5)

## 2014-12-17 LAB — CK: Total CK: 704 U/L — ABNORMAL HIGH (ref 7–177)

## 2014-12-17 LAB — COMPREHENSIVE METABOLIC PANEL
ALK PHOS: 48 U/L (ref 39–117)
ALT: 53 U/L — ABNORMAL HIGH (ref 0–35)
AST: 55 U/L — ABNORMAL HIGH (ref 0–37)
Albumin: 3.2 g/dL — ABNORMAL LOW (ref 3.5–5.2)
Anion gap: 5 (ref 5–15)
BILIRUBIN TOTAL: 1.1 mg/dL (ref 0.3–1.2)
BUN: 8 mg/dL (ref 6–23)
CHLORIDE: 111 mmol/L (ref 96–112)
CO2: 24 mmol/L (ref 19–32)
Calcium: 7.9 mg/dL — ABNORMAL LOW (ref 8.4–10.5)
Creatinine, Ser: 0.54 mg/dL (ref 0.50–1.10)
GFR calc non Af Amer: >90 mL/min (ref >90)
Glucose, Bld: 133 mg/dL — ABNORMAL HIGH (ref 70–99)
Potassium: 3.6 mmol/L (ref 3.5–5.1)
SODIUM: 140 mmol/L (ref 135–145)
Total Protein: 5.4 g/dL — ABNORMAL LOW (ref 6.0–8.3)

## 2014-12-17 LAB — MRSA PCR SCREENING: MRSA by PCR: NEGATIVE

## 2014-12-17 SURGERY — OPEN REDUCTION INTERNAL FIXATION (ORIF) WRIST FRACTURE
Anesthesia: General | Site: Wrist | Laterality: Left

## 2014-12-17 MED ORDER — ROCURONIUM BROMIDE 100 MG/10ML IV SOLN
INTRAVENOUS | Status: DC | PRN
  Administered 2014-12-17: 50 mg via INTRAVENOUS

## 2014-12-17 MED ORDER — HYDROMORPHONE HCL 1 MG/ML IJ SOLN
0.2500 mg | INTRAMUSCULAR | Status: DC | PRN
  Administered 2014-12-17 (×4): 0.5 mg via INTRAVENOUS

## 2014-12-17 MED ORDER — 0.9 % SODIUM CHLORIDE (POUR BTL) OPTIME
TOPICAL | Status: DC | PRN
  Administered 2014-12-17: 1000 mL

## 2014-12-17 MED ORDER — PROPOFOL 10 MG/ML IV BOLUS
INTRAVENOUS | Status: DC | PRN
  Administered 2014-12-17: 120 mg via INTRAVENOUS

## 2014-12-17 MED ORDER — LACTATED RINGERS IV SOLN
INTRAVENOUS | Status: DC | PRN
  Administered 2014-12-17 (×2): via INTRAVENOUS

## 2014-12-17 MED ORDER — ONDANSETRON HCL 4 MG/2ML IJ SOLN: 4.0000 mg | Freq: Once | INTRAMUSCULAR | Status: DC | PRN

## 2014-12-17 MED ORDER — CEFAZOLIN SODIUM-DEXTROSE 2-3 GM-% IV SOLR
INTRAVENOUS | Status: DC | PRN
  Administered 2014-12-17: 2 g via INTRAVENOUS

## 2014-12-17 MED ORDER — ONDANSETRON HCL 4 MG/2ML IJ SOLN
INTRAMUSCULAR | Status: DC | PRN
  Administered 2014-12-17: 4 mg via INTRAVENOUS

## 2014-12-17 MED ORDER — FENTANYL CITRATE 0.05 MG/ML IJ SOLN
INTRAMUSCULAR | Status: AC
  Filled 2014-12-17: qty 5

## 2014-12-17 MED ORDER — NEOSTIGMINE METHYLSULFATE 10 MG/10ML IV SOLN
INTRAVENOUS | Status: DC | PRN
  Administered 2014-12-17: 4 mg via INTRAVENOUS

## 2014-12-17 MED ORDER — MEPERIDINE HCL 25 MG/ML IJ SOLN
6.2500 mg | INTRAMUSCULAR | Status: DC | PRN
  Administered 2014-12-17: 12.5 mg via INTRAVENOUS

## 2014-12-17 MED ORDER — LIDOCAINE HCL (CARDIAC) 20 MG/ML IV SOLN
INTRAVENOUS | Status: DC | PRN
  Administered 2014-12-17: 100 mg via INTRAVENOUS

## 2014-12-17 MED ORDER — LIDOCAINE HCL (CARDIAC) 20 MG/ML IV SOLN
INTRAVENOUS | Status: AC
  Filled 2014-12-17: qty 5

## 2014-12-17 MED ORDER — ROCURONIUM BROMIDE 50 MG/5ML IV SOLN
INTRAVENOUS | Status: AC
  Filled 2014-12-17: qty 1

## 2014-12-17 MED ORDER — MIDAZOLAM HCL 2 MG/2ML IJ SOLN
INTRAMUSCULAR | Status: AC
  Filled 2014-12-17: qty 2

## 2014-12-17 MED ORDER — HYDROMORPHONE HCL 1 MG/ML IJ SOLN
INTRAMUSCULAR | Status: AC
  Filled 2014-12-17: qty 2

## 2014-12-17 MED ORDER — FENTANYL CITRATE 0.05 MG/ML IJ SOLN
INTRAMUSCULAR | Status: DC | PRN
  Administered 2014-12-17: 100 ug via INTRAVENOUS
  Administered 2014-12-17 (×4): 50 ug via INTRAVENOUS

## 2014-12-17 MED ORDER — GLYCOPYRROLATE 0.2 MG/ML IJ SOLN
INTRAMUSCULAR | Status: DC | PRN
  Administered 2014-12-17: .6 mg via INTRAVENOUS

## 2014-12-17 MED ORDER — ONDANSETRON HCL 4 MG/2ML IJ SOLN
INTRAMUSCULAR | Status: AC
  Filled 2014-12-17: qty 2

## 2014-12-17 MED ORDER — MEPERIDINE HCL 25 MG/ML IJ SOLN
INTRAMUSCULAR | Status: AC
  Administered 2014-12-17: 12.5 mg via INTRAVENOUS
  Filled 2014-12-17: qty 1

## 2014-12-17 MED ORDER — MIDAZOLAM HCL 5 MG/5ML IJ SOLN
INTRAMUSCULAR | Status: DC | PRN
  Administered 2014-12-17: 2 mg via INTRAVENOUS

## 2014-12-17 SURGICAL SUPPLY — 75 items
BANDAGE ELASTIC 3 VELCRO ST LF (GAUZE/BANDAGES/DRESSINGS) ×4 IMPLANT
BANDAGE ELASTIC 4 VELCRO ST LF (GAUZE/BANDAGES/DRESSINGS) ×4 IMPLANT
BIT DRILL 2.2 SS TIBIAL (BIT) ×8 IMPLANT
BLADE SURG ROTATE 9660 (MISCELLANEOUS) IMPLANT
BNDG ESMARK 4X9 LF (GAUZE/BANDAGES/DRESSINGS) IMPLANT
BNDG GAUZE ELAST 4 BULKY (GAUZE/BANDAGES/DRESSINGS) ×4 IMPLANT
CORDS BIPOLAR (ELECTRODE) ×4 IMPLANT
COVER SURGICAL LIGHT HANDLE (MISCELLANEOUS) ×4 IMPLANT
CUFF TOURNIQUET SINGLE 18IN (TOURNIQUET CUFF) ×4 IMPLANT
CUFF TOURNIQUET SINGLE 24IN (TOURNIQUET CUFF) IMPLANT
DRAIN TLS ROUND 10FR (DRAIN) IMPLANT
DRAPE C-ARM 42X72 X-RAY (DRAPES) ×4 IMPLANT
DRAPE C-ARMOR (DRAPES) ×4 IMPLANT
DRAPE LAPAROTOMY 100X72X124 (DRAPES) ×4 IMPLANT
DRAPE OEC MINIVIEW 54X84 (DRAPES) ×4 IMPLANT
DRAPE ORTHO SPLIT 77X108 STRL (DRAPES) ×2
DRAPE SURG 17X23 STRL (DRAPES) IMPLANT
DRAPE SURG ORHT 6 SPLT 77X108 (DRAPES) ×2 IMPLANT
DRSG ADAPTIC 3X8 NADH LF (GAUZE/BANDAGES/DRESSINGS) ×4 IMPLANT
DRSG MEPILEX BORDER 4X4 (GAUZE/BANDAGES/DRESSINGS) ×4 IMPLANT
GAUZE SPONGE 4X4 12PLY STRL (GAUZE/BANDAGES/DRESSINGS) IMPLANT
GAUZE XEROFORM 1X8 LF (GAUZE/BANDAGES/DRESSINGS) IMPLANT
GAUZE XEROFORM 5X9 LF (GAUZE/BANDAGES/DRESSINGS) ×4 IMPLANT
GLOVE BIOGEL M STRL SZ7.5 (GLOVE) ×4 IMPLANT
GLOVE SS BIOGEL STRL SZ 8 (GLOVE) ×2 IMPLANT
GLOVE SUPERSENSE BIOGEL SZ 8 (GLOVE) ×2
GOWN STRL REUS W/ TWL LRG LVL3 (GOWN DISPOSABLE) ×6 IMPLANT
GOWN STRL REUS W/ TWL XL LVL3 (GOWN DISPOSABLE) ×6 IMPLANT
GOWN STRL REUS W/TWL LRG LVL3 (GOWN DISPOSABLE) ×6
GOWN STRL REUS W/TWL XL LVL3 (GOWN DISPOSABLE) ×6
GUIDEPIN 2.8X300 THRD TIP OIC (Screw) ×4 IMPLANT
KIT BASIN OR (CUSTOM PROCEDURE TRAY) ×4 IMPLANT
KIT ROOM TURNOVER OR (KITS) ×4 IMPLANT
LOOP VESSEL MAXI BLUE (MISCELLANEOUS) IMPLANT
MANIFOLD NEPTUNE II (INSTRUMENTS) IMPLANT
NEEDLE 22X1 1/2 (OR ONLY) (NEEDLE) IMPLANT
NS IRRIG 1000ML POUR BTL (IV SOLUTION) ×4 IMPLANT
PACK GENERAL/GYN (CUSTOM PROCEDURE TRAY) ×4 IMPLANT
PACK ORTHO EXTREMITY (CUSTOM PROCEDURE TRAY) ×8 IMPLANT
PAD ARMBOARD 7.5X6 YLW CONV (MISCELLANEOUS) ×8 IMPLANT
PAD CAST 3X4 CTTN HI CHSV (CAST SUPPLIES) ×2 IMPLANT
PAD CAST 4YDX4 CTTN HI CHSV (CAST SUPPLIES) ×6 IMPLANT
PADDING CAST COTTON 3X4 STRL (CAST SUPPLIES) ×2
PADDING CAST COTTON 4X4 STRL (CAST SUPPLIES) ×6
PEG LOCKING SMOOTH 2.2X18 (Peg) ×8 IMPLANT
PEG LOCKING SMOOTH 2.2X20 (Screw) ×8 IMPLANT
PLATE STANDARD DVR LEFT (Plate) ×4 IMPLANT
PLATE STD DVR LT 24X51 (Plate) ×2 IMPLANT
SCREW CANN 7.3X150 32MM THRD (Screw) ×4 IMPLANT
SCREW LOCK 14X2.7X 3 LD TPR (Screw) ×4 IMPLANT
SCREW LOCK 18X2.7X 3 LD TPR (Screw) ×4 IMPLANT
SCREW LOCK 20X2.7X 3 LD TPR (Screw) ×2 IMPLANT
SCREW LOCKING 2.7X13MM (Screw) ×4 IMPLANT
SCREW LOCKING 2.7X14 (Screw) ×4 IMPLANT
SCREW LOCKING 2.7X15MM (Screw) ×4 IMPLANT
SCREW LOCKING 2.7X18 (Screw) ×4 IMPLANT
SCREW LOCKING 2.7X20MM (Screw) ×2 IMPLANT
SPLINT FIBERGLASS 4X30 (CAST SUPPLIES) ×4 IMPLANT
SPONGE GAUZE 4X4 12PLY STER LF (GAUZE/BANDAGES/DRESSINGS) ×4 IMPLANT
SPONGE LAP 4X18 X RAY DECT (DISPOSABLE) IMPLANT
SUT ETHILON 3 0 PS 1 (SUTURE) ×4 IMPLANT
SUT MNCRL AB 4-0 PS2 18 (SUTURE) ×4 IMPLANT
SUT PROLENE 3 0 PS 2 (SUTURE) IMPLANT
SUT PROLENE 4 0 P 3 18 (SUTURE) ×8 IMPLANT
SUT PROLENE 4 0 PS 2 18 (SUTURE) ×4 IMPLANT
SUT VIC AB 3-0 FS2 27 (SUTURE) ×4 IMPLANT
SYR CONTROL 10ML LL (SYRINGE) IMPLANT
SYSTEM CHEST DRAIN TLS 7FR (DRAIN) ×4 IMPLANT
TOWEL OR 17X24 6PK STRL BLUE (TOWEL DISPOSABLE) ×8 IMPLANT
TOWEL OR 17X26 10 PK STRL BLUE (TOWEL DISPOSABLE) ×4 IMPLANT
TUBE CONNECTING 12'X1/4 (SUCTIONS) ×1
TUBE CONNECTING 12X1/4 (SUCTIONS) ×3 IMPLANT
TUBE EVACUATION TLS (MISCELLANEOUS) ×4 IMPLANT
WASHER OIC 13MM 6 PACK (Screw) ×4 IMPLANT
WATER STERILE IRR 1000ML POUR (IV SOLUTION) IMPLANT

## 2014-12-17 NOTE — Anesthesia Procedure Notes (Signed)
Procedure Name: Intubation Date/Time: 12/17/2014 4:40 PM Performed by: Clearnce Sorrel Pre-anesthesia Checklist: Patient identified, Timeout performed, Emergency Drugs available, Suction available and Patient being monitored Patient Re-evaluated:Patient Re-evaluated prior to inductionOxygen Delivery Method: Circle system utilized Preoxygenation: Pre-oxygenation with 100% oxygen Intubation Type: IV induction Ventilation: Mask ventilation without difficulty Laryngoscope Size: Mac and 3 Grade View: Grade I Tube type: Oral Tube size: 7.0 mm Number of attempts: 1 Placement Confirmation: ETT inserted through vocal cords under direct vision,  breath sounds checked- equal and bilateral and positive ETCO2 Secured at: 23 cm Tube secured with: Tape Dental Injury: Teeth and Oropharynx as per pre-operative assessment

## 2014-12-17 NOTE — Progress Notes (Signed)
Patient transferred to surgery for ORIF surgical repair.  Report given to receiving RN Sharyn Lull all questions answered at this time.  Pt. VSS with no s/s of distress noted.  Patient stable at transfer.
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
## 2014-12-17 NOTE — Progress Notes (Signed)
Patient ID: Hailey Walsh, female   DOB: 1980/12/04, 34 y.o.   MRN: [id number]    LOS: 1 day   Subjective: Doing ok, thirsty.   Objective: Vital signs in last 24 hours: Temp:  [98.1 F (36.7 C)-99.4 F (37.4 C)] 99.4 F (37.4 C) (02/19 0417) Pulse Rate:  [76-106] 98 (02/19 0417) Resp:  [11-28] 14 (02/19 0417) BP: (100-125)/(59-73) 100/67 mmHg (02/19 0417) SpO2:  [97 %-100 %] 100 % (02/19 0417) Weight:  [160 lb (72.576 kg)-169 lb 12.1 oz (77 kg)] 169 lb 12.1 oz (77 kg) (02/18 2221) Last BM Date: 10/16/15   Laboratory  CBC  Recent Labs  12/16/14 1620 12/16/14 1626 12/17/14 0408  WBC 16.6*  --  7.8  HGB 13.3 14.3 11.3*  HCT 37.7 42.0 32.3*  PLT 205  --  171   BMET  Recent Labs  12/16/14 1620 12/16/14 1626 12/17/14 0408  NA 137 141 140  K 3.6 3.6 3.6  CL 107 105 111  CO2 22  --  24  GLUCOSE 138* 139* 133*  BUN 12 14 8   CREATININE 0.64 0.70 0.54  CALCIUM 8.4  --  7.9*    Physical Exam General appearance: alert and no distress Resp: clear to auscultation bilaterally Cardio: regular rate and rhythm GI: normal findings: bowel sounds normal and soft, non-tender Extremities: NVI   Assessment/Plan: Fall Left wrist fx -- for ORIF by Dr. Amedeo Plenty Grade 2 splenic lac  Multiple L-spine TVP fxs Right sacral fx -- Dr. Marcelino Scot to review ABL anemia Hx/o DVT -- Restart Xarelto once hgb stable x72h FEN -- Continue NPO Dispo -- Bedrest for now, ok for OR for left wrist, transfer to floor    Lisette Abu, PA-C Pager: [phone] [facility] Pager: [phone]  12/17/2014

## 2014-12-17 NOTE — Op Note (Signed)
See dictation # [id number] Elfida Shimada MD

## 2014-12-17 NOTE — Anesthesia Preprocedure Evaluation (Signed)
Anesthesia Evaluation  Patient identified by MRN, date of birth, ID band Patient awake    Reviewed: Allergy & Precautions, NPO status , Patient's Chart, lab work & pertinent test results  Airway Mallampati: II  TM Distance: >3 FB Neck ROM: Full    Dental   Pulmonary Current Smoker,          Cardiovascular Rhythm:Regular     Neuro/Psych    GI/Hepatic   Endo/Other    Renal/GU      Musculoskeletal   Abdominal   Peds  Hematology   Anesthesia Other Findings   Reproductive/Obstetrics                             Anesthesia Physical Anesthesia Plan  ASA: II  Anesthesia Plan: General   Post-op Pain Management:    Induction: Intravenous  Airway Management Planned: Oral ETT  Additional Equipment:   Intra-op Plan:   Post-operative Plan: Extubation in OR  Informed Consent: I have reviewed the patients History and Physical, chart, labs and discussed the procedure including the risks, benefits and alternatives for the proposed anesthesia with the patient or authorized representative who has indicated his/her understanding and acceptance.   Dental advisory given  Plan Discussed with: Anesthesiologist and Surgeon  Anesthesia Plan Comments:         Anesthesia Quick Evaluation

## 2014-12-17 NOTE — Brief Op Note (Signed)
12/16/2014 - 12/17/2014  6:10 PM  PATIENT:  Hailey Walsh  34 y.o. female  PRE-OPERATIVE DIAGNOSIS:   1. Unstable pelvic ring fracture 2. Left distal radius fracture  POST-OPERATIVE DIAGNOSIS:   1. Unstable pelvic ring fracture 2. Left distal radius fracture  PROCEDURE:  Procedure(s): 1. TRANSSACRO-ILIAC PINNING (Right and Left) 2. OPEN REDUCTION INTERNAL FIXATION (ORIF) WRIST FRACTURE (Left)  SURGEON:  Surgeon(s) and Role: Panel 1:    * Rozanna Box, MD - Primary  Panel 2:   * Roseanne Kaufman, MD - Primary    PHYSICIAN ASSISTANT: None  ANESTHESIA:   general  I/O:  Total I/O In: 400 [I.V.:400] Out: 300 [Urine:300]  SPECIMEN:  No Specimen  TOURNIQUET:   Total Tourniquet Time Documented: Upper Arm (Left) - 50 minutes Total: Upper Arm (Left) - 50 minutes   DICTATION: .Other Dictation: Dictation Number [id number]

## 2014-12-17 NOTE — Anesthesia Postprocedure Evaluation (Signed)
  Anesthesia Post-op Note  Patient: Hailey Walsh  Procedure(s) Performed: Procedure(s): OPEN REDUCTION INTERNAL FIXATION (ORIF) WRIST FRACTURE (Left) SACRO-ILIAC PINNING (Left)  Patient Location: PACU  Anesthesia Type:General  Level of Consciousness: awake, oriented, sedated and patient cooperative  Airway and Oxygen Therapy: Patient Spontanous Breathing  Post-op Pain: mild  Post-op Assessment: Post-op Vital signs reviewed, Patient's Cardiovascular Status Stable, Respiratory Function Stable, Patent Airway, No signs of Nausea or vomiting and Pain level controlled  Post-op Vital Signs: stable  Last Vitals:  Filed Vitals:   12/17/14 1938  BP:   Pulse: 69  Temp:   Resp: 15    Complications: No apparent anesthesia complications

## 2014-12-17 NOTE — Transfer of Care (Signed)
Immediate Anesthesia Transfer of Care Note  Patient: Hailey Walsh  Procedure(s) Performed: Procedure(s): OPEN REDUCTION INTERNAL FIXATION (ORIF) WRIST FRACTURE (Left) SACRO-ILIAC PINNING (Left)  Patient Location: PACU  Anesthesia Type:General  Level of Consciousness: awake, alert  and oriented  Airway & Oxygen Therapy: Patient Spontanous Breathing and Patient connected to face mask oxygen  Post-op Assessment: Report given to RN and Post -op Vital signs reviewed and stable  Post vital signs: Reviewed and stable  Last Vitals:  Filed Vitals:   12/17/14 1242  BP: 119/79  Pulse: 98  Temp:   Resp: 13    Complications: No apparent anesthesia complications

## 2014-12-17 NOTE — Progress Notes (Signed)
UR completed.  Dail Meece, RN BSN MHA CCM Trauma/Neuro ICU Case Manager [phone]  

## 2014-12-18 ENCOUNTER — Encounter (HOSPITAL_COMMUNITY): Payer: Self-pay | Admitting: Orthopedic Surgery

## 2014-12-18 LAB — CBC
HCT: 31 % — ABNORMAL LOW (ref 36.0–46.0)
Hemoglobin: 10.8 g/dL — ABNORMAL LOW (ref 12.0–15.0)
MCH: 32.3 pg (ref 26.0–34.0)
MCHC: 34.8 g/dL (ref 30.0–36.0)
MCV: 92.8 fL (ref 78.0–100.0)
PLATELETS: 120 10*3/uL — AB (ref 150–400)
RBC: 3.34 MIL/uL — ABNORMAL LOW (ref 3.87–5.11)
RDW: 12.3 % (ref 11.5–15.5)
WBC: 8 10*3/uL (ref 4.0–10.5)

## 2014-12-18 MED ORDER — VITAMIN C 500 MG PO TABS
1000.0000 mg | ORAL_TABLET | Freq: Every morning | ORAL | Status: DC
  Administered 2014-12-18 – 2014-12-21 (×4): 1000 mg via ORAL
  Filled 2014-12-18 (×4): qty 2

## 2014-12-18 MED ORDER — METHOCARBAMOL 500 MG PO TABS
500.0000 mg | ORAL_TABLET | Freq: Four times a day (QID) | ORAL | Status: DC | PRN
Start: 2014-12-18 — End: 2014-12-21
  Administered 2014-12-18 – 2014-12-20 (×8): 500 mg via ORAL
  Filled 2014-12-18 (×8): qty 1

## 2014-12-18 MED ORDER — CEFAZOLIN SODIUM 1-5 GM-% IV SOLN
1.0000 g | Freq: Three times a day (TID) | INTRAVENOUS | Status: DC
Start: 2014-12-18 — End: 2014-12-20
  Administered 2014-12-18 – 2014-12-20 (×7): 1 g via INTRAVENOUS
  Filled 2014-12-18 (×9): qty 50

## 2014-12-18 MED ORDER — OXYCODONE HCL 5 MG PO TABS
10.0000 mg | ORAL_TABLET | ORAL | Status: DC | PRN
  Administered 2014-12-18 – 2014-12-20 (×8): 10 mg via ORAL
  Filled 2014-12-18 (×8): qty 2

## 2014-12-18 MED ORDER — SENNOSIDES-DOCUSATE SODIUM 8.6-50 MG PO TABS
1.0000 | ORAL_TABLET | Freq: Two times a day (BID) | ORAL | Status: DC
  Administered 2014-12-18 – 2014-12-21 (×7): 1 via ORAL
  Filled 2014-12-18 (×7): qty 1

## 2014-12-18 NOTE — Progress Notes (Signed)
SPORTS MEDICINE AND JOINT REPLACEMENT  Lara Mulch, MD   Carlynn Spry, PA-C [place], [place], [place]  [address]                             [phone]   PROGRESS NOTE  Subjective:  negative for Chest Pain  negative for Shortness of Breath  negative for Nausea/Vomiting   negative for Calf Pain  negative for Bowel Movement   Tolerating Diet: yes         Patient reports pain as 6 on 0-10 scale.    Objective: Vital signs in last 24 hours:   Patient Vitals for the past 24 hrs:  BP Temp Temp src Pulse Resp SpO2  12/18/14 0452 126/74 mmHg 99.9 F (37.7 C) Oral 99 16 100 %  12/18/14 0205 124/82 mmHg 98.4 F (36.9 C) Oral 87 14 100 %  12/17/14 2029 126/76 mmHg 98.7 F (37.1 C) Oral 82 13 100 %  12/17/14 2000 - - - 71 10 100 %  12/17/14 1945 - 98.1 F (36.7 C) - 68 11 100 %  12/17/14 1938 - - - 69 15 100 %  12/17/14 1930 - - - 67 15 100 %  12/17/14 1920 - - - 77 (!) 29 100 %  12/17/14 1915 - - - 62 13 100 %  12/17/14 1900 - - - 84 17 100 %  12/17/14 1845 136/86 mmHg 97.8 F (36.6 C) - 83 19 100 %  12/17/14 1840 - 97.8 F (36.6 C) - - - -  12/17/14 1242 119/79 mmHg - - 98 13 98 %  12/17/14 1200 - 98.7 F (37.1 C) Oral - - -  12/17/14 0840 113/74 mmHg - - (!) 109 14 96 %    @flow {1959:LAST@   Intake/Output from previous day:   02/19 0701 - 02/20 0700 In: 3406.7 [I.V.:3406.7] Out: 908 [Urine:900; Drains:8]   Intake/Output this shift:       Intake/Output      02/19 0701 - 02/20 0700 02/20 0701 - 02/21 0700   P.O. 0    I.V. (mL/kg) 3406.7 (44.2)    Total Intake(mL/kg) 3406.7 (44.2)    Urine (mL/kg/hr) 900 (0.5)    Drains 8 (0)    Total Output 908     Net +2498.7             LABORATORY DATA:  Recent Labs  12/16/14 1620 12/16/14 1626 12/17/14 0408 12/18/14 0443  WBC 16.6*  --  7.8 8.0  HGB 13.3 14.3 11.3* 10.8*  HCT 37.7 42.0 32.3* 31.0*  PLT 205  --  171 120*    Recent Labs  12/16/14 1620 12/16/14 1626 12/17/14 0408  NA 137 141  140  K 3.6 3.6 3.6  CL 107 105 111  CO2 22  --  24  BUN 12 14 8   CREATININE 0.64 0.70 0.54  GLUCOSE 138* 139* 133*  CALCIUM 8.4  --  7.9*   Lab Results  Component Value Date   INR 1.02 12/16/2014    Examination:  General appearance: alert, cooperative and no distress Extremities: Homans sign is negative, no sign of DVT  Wound Exam: clean, dry, intact   Drainage:  None: wound tissue dry  Motor Exam: EHL and FHL Intact  Sensory Exam: Deep Peroneal normal   Assessment:    1 Day Post-Op  Procedure(s) (LRB): OPEN REDUCTION INTERNAL FIXATION (ORIF) WRIST FRACTURE (Left) SACRO-ILIAC PINNING (Left)  ADDITIONAL  DIAGNOSIS:  Active Problems:   Splenic laceration   Fall   Left wrist fracture   Lumbar transverse process fracture   Sacral fracture  Acute Blood Loss Anemia   Plan:  Ms. Virgilio Frees will be nonweightbearing on the right lower extremity with nonweightbearing on the right with use of a platform walker on the left. She will progress to partial weightbearing at 8 weeks and will be full by 3 months. She will be on formal DVT prophylaxis pharmacologically and will be followed by the Trauma Service.        Katai Marsico 12/18/2014, 8:33 AM
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
## 2014-12-18 NOTE — Progress Notes (Signed)
Patient ID: Hailey Walsh, female   DOB: 1980/12/02, 34 y.o.   MRN: [id number]    LOS: 2 days   Subjective: Doing ok, thirsty, hungry.   Objective: Vital signs in last 24 hours: Temp:  [97.8 F (36.6 C)-100.5 F (38.1 C)] 100.5 F (38.1 C) (02/20 0920) Pulse Rate:  [62-99] 99 (02/20 0920) Resp:  [10-29] 16 (02/20 0920) BP: (119-136)/(74-86) 124/80 mmHg (02/20 0920) SpO2:  [95 %-100 %] 95 % (02/20 0920) Last BM Date: 10/16/15   Laboratory  CBC  Recent Labs  12/17/14 0408 12/18/14 0443  WBC 7.8 8.0  HGB 11.3* 10.8*  HCT 32.3* 31.0*  PLT 171 120*   BMET  Recent Labs  12/16/14 1620 12/16/14 1626 12/17/14 0408  NA 137 141 140  K 3.6 3.6 3.6  CL 107 105 111  CO2 22  --  24  GLUCOSE 138* 139* 133*  BUN 12 14 8   CREATININE 0.64 0.70 0.54  CALCIUM 8.4  --  7.9*    Physical Exam General appearance: alert and no distress Resp: breathing comfortably GI: normal findings: and soft, non-tender Extremities: NVI   Assessment/Plan: Fall Left wrist fx -- for ORIF by Dr. Amedeo Plenty Grade 2 splenic lac - diet as tolerated.  HCT essentially stable.  Hope to mobilize tomorrow.   Multiple L-spine TVP fxs Right sacral fx -- Dr. Marcelino Scot to review ABL anemia Hx/o DVT -- Restart Xarelto once hgb stable x72h FEN -- Continue NPO Dispo -- Bedrest for now, ok for OR for left wrist, transfer to floor   General Trauma PA Pager: [phone]  12/18/2014

## 2014-12-18 NOTE — Evaluation (Signed)
Physical Therapy Evaluation Patient Details Name: Hailey Walsh MRN: [id number] DOB: May 29, 1981 Today's Date: 12/18/2014   History of Present Illness  pt presents after fall off ladder 20' sustaining L wrist fx s/p ORIF, L2-5 TVP fxs, and R Sacral fx s/p R to L SI pinning.    Clinical Impression  Pt very painful with even minimal movement.  RN gave IV meds while PT in room.  Pt attempted to use Platform RW for mobility, but unable to unweight L LE and has to "wiggle" her L LE at this time for any mobility.  Feel pt would benefit from CIR at D/C to maximize independence prior to returning to home with her family.  Will continue to follow.      Follow Up Recommendations CIR    Equipment Recommendations  Rolling walker with 5" wheels;3in1 (PT);Wheelchair (measurements PT);Wheelchair cushion (measurements PT) (L Platform RW)    Recommendations for Other Services Rehab consult     Precautions / Restrictions Precautions Precautions: Fall;Back Precaution Booklet Issued: No Restrictions Weight Bearing Restrictions: Yes LUE Weight Bearing: Weight bear through elbow only RLE Weight Bearing: Non weight bearing      Mobility  Bed Mobility Overal bed mobility: Needs Assistance;+2 for physical assistance Bed Mobility: Rolling;Sidelying to Sit;Sit to Sidelying Rolling: Mod assist Sidelying to sit: Min assist;+2 for physical assistance     Sit to sidelying: Mod assist;+2 for physical assistance General bed mobility comments: pt very painful on L side of back, so wanting to roll to R side, however this increases pain in R hip/pelvis, so did not complete log roll prior to coming to sitting.    Transfers Overall transfer level: Needs assistance Equipment used: Left platform walker Transfers: Sit to/from Stand;Stand Pivot Transfers Sit to Stand: Min assist;+2 physical assistance Stand pivot transfers: Min assist;+2 physical assistance       General transfer comment: pt follows  cueing well, but mobility limited by pain.  pt unable to fully unweight L LE to hop towards 3-in-1, so has to "wiggle" on L LE to move.    Ambulation/Gait Ambulation/Gait assistance: Min assist;+2 physical assistance Ambulation Distance (Feet): 2 Feet Assistive device: Left platform walker       General Gait Details: pt unable to unweight L LE to try to hop with PFRW, but "wiggles" on her L foot forwards 2' and back to bed.  Performed this x2.    Stairs            Wheelchair Mobility    Modified Rankin (Stroke Patients Only)       Balance Overall balance assessment: Needs assistance Sitting-balance support: Single extremity supported;Feet supported Sitting balance-Leahy Scale: Poor Sitting balance - Comments: Difficulty maintaining balance 2/2 pain without UE support.     Standing balance support: Bilateral upper extremity supported;During functional activity Standing balance-Leahy Scale: Poor                               Pertinent Vitals/Pain Pain Assessment: 0-10 Pain Score: 8  Pain Location: L side of back and R hip.   Pain Descriptors / Indicators: Grimacing;Guarding Pain Intervention(s): Monitored during session;Premedicated before session;Repositioned;RN gave pain meds during session    [place] expects to be discharged to:: Unsure Living Arrangements: Spouse/significant other;Children;Other relatives               Additional Comments: pt states she has 5 steps without a railing to enter her home, but that  home is on one level once inside.      Prior Function Level of Independence: Independent               Hand Dominance        Extremity/Trunk Assessment   Upper Extremity Assessment: Defer to OT evaluation           Lower Extremity Assessment: RLE deficits/detail;LLE deficits/detail RLE Deficits / Details: AROM WFL, but strength limited by pain.   LLE Deficits / Details: AROM WFL, but strength limited  by pain.    Cervical / Trunk Assessment: Normal  Communication   Communication: Prefers language other than English  Cognition Arousal/Alertness: Awake/alert Behavior During Therapy: WFL for tasks assessed/performed Overall Cognitive Status: Within Functional Limits for tasks assessed                      General Comments      Exercises        Assessment/Plan    PT Assessment Patient needs continued PT services  PT Diagnosis Difficulty walking;Acute pain   PT Problem List Decreased strength;Decreased activity tolerance;Decreased balance;Decreased mobility;Decreased knowledge of use of DME;Decreased knowledge of precautions;Pain  PT Treatment Interventions DME instruction;Gait training;Stair training;Functional mobility training;Therapeutic activities;Therapeutic exercise;Balance training;Patient/family education   PT Goals (Current goals can be found in the Care Plan section) Acute Rehab PT Goals Patient Stated Goal: Move without pain.   PT Goal Formulation: With patient Time For Goal Achievement: 01/01/15 Potential to Achieve Goals: Good    Frequency Min 5X/week   Barriers to discharge        Co-evaluation               End of Session Equipment Utilized During Treatment: Gait belt Activity Tolerance: Patient limited by pain Patient left: in bed;with call bell/phone within reach;with family/visitor present Nurse Communication: Mobility status         Time: 1207-1256 PT Time Calculation (min) (ACUTE ONLY): 49 min   Charges:   PT Evaluation $Initial PT Evaluation Tier I: 1 Procedure PT Treatments $Therapeutic Activity: 23-37 mins   PT G CodesCatarina Hartshorn, [place] [phone] 12/18/2014, 2:17 PM

## 2014-12-18 NOTE — Progress Notes (Signed)
Patient ID: Hailey Walsh, female   DOB: 26-Feb-1981, 34 y.o.   MRN: [id number] Patient is alert and oriented at bedside.  Patient has no complaints.  She moves her fingers well and can feel the fingers nicely.  I removed her drain.  At present time we'll continue nonweightbearing to the wrist and forearm. It is okay to be on a platform walker with some weight on her elbow for therapeutic purposes. Dr. Marcelino Scot and I have discussed this.  I'll be available for any problems or questions.  We will see her Monday.  My cell phone number is [phone] for any questions.  Hopefully therapy can begin some OT as well as PT for her.  Kiari Hosmer M.D.

## 2014-12-18 NOTE — Op Note (Signed)
NAME:  Hailey Walsh, MOUDY NO.:  [id number]  MEDICAL RECORD NO.:  [id number]  LOCATION:  6N10C                        FACILITY:  [facility]  PHYSICIAN:  Satira Anis. Khyli Swaim, M.D.DATE OF BIRTH:  03-02-1981  DATE OF PROCEDURE: DATE OF DISCHARGE:                              OPERATIVE REPORT   PREOPERATIVE DIAGNOSIS:  Comminuted complex intra-articular left distal radius fracture with intra-articular displacement greater than five- part.  POSTOPERATIVE DIAGNOSIS:  Comminuted complex intra-articular left distal radius fracture with intra-articular displacement greater than five- part.  PROCEDURES: 1. Open reduction and internal fixation, left distal radius fracture     comminuted complex greater than five-part. 2. Stress radiography/AP, lateral, and oblique x-rays performed,     examined, and interpreted by myself.  SURGEON:  Satira Anis. Amedeo Plenty, M.D.  ASSISTANT:  None.  COMPLICATIONS:  None.  TOURNIQUET TIME:  Less than an hour.  DRAINS:  One.  ESTIMATED BLOOD LOSS:  Minimal.  INDICATIONS FOR PROCEDURE:  This patient is a pleasant female who presents with the above-mentioned diagnosis.  I have counseled her in regard to risks and benefits of surgery.  She had a severe fall.  Her injuries include splenic injury as well as a sacrum injury and Dr. Marcelino Scot is treating her pelvic injuries.  I have discussed with her the risks and benefits of surgery including infection, bleeding, anesthesia, damage to normal structures, and failure of surgery to accomplish its intended goals of relieving symptoms and restoring function.  With this in mind, she desires to proceed.  All questions have been encouraged and answered preoperatively.  OPERATIVE PROCEDURE:  The patient was seen by myself and Anesthesia, taken to operative suite, underwent smooth induction of general anesthetic.  She was laid supine, fully padded, prepped and draped in usual sterile fashion.  She had a  pre-scrub by myself followed by 10 minutes of surgical Betadine scrub followed by sterile draping.  Once it was completed, the patient had outline marks made and a curvilinear volar radial incision was made.  Dissection was carried down.  FCR sheath was split dorsally and volarly.  FCR was retracted out of harm's way.  Following this, carpal canal contents were retracted ulnarly and the pronator was then incised.  I accessed the fracture site and then performed a very careful and gentle reduction.  Following reassembly of the fragments, we then applied a DVR plate and screw construct.  I was able to achieve adequate alignment in terms of volar tilt, radial height, and radial inclination.  The distal radioulnar joint was quite stable.  She had some comminution dorsally, but no instability with range of motion.  The very comminuted dorsal fragments had left alone as there was no fixation that could be obtainable to these very small tiny comminuted pieces.  This was away from the arc of motion as well.  With this noted, final copy x-rays were made.  AP, lateral, and oblique x-rays were performed, examined, and interpreted by myself.  The patient tolerated this well.  Following the plate fixation, I then repaired the pronator, irrigated copiously with greater than a liter of saline, ultimately closed the wound of course over a TLS drain size 7.  The wound was closed with Prolene of 4-0 variety.  She had soft compartments, excellent refill, no complicating features.  I was pleased with fixation.  Her distal radioulnar joint was stable.  She had smooth arc of motion. Some of the comminution dorsally we left as mentioned above and I feel that this would do reasonably well.  We were going to stress range of motion to the fingers and elbow, platform walker use will be okay for her going forward.  I have discussed with her the relevant issues, do's and don'ts, etc.  This is a very  comminuted greater than five-part fracture; however, we were able to achieve good radiographic fixation parameters.  It was a pleasure to see her today.  We are going to do our best to try to give her the best wrist and upper extremity possible.     Satira Anis. Amedeo Plenty, M.D.     [facility]  D:  12/17/2014  T:  12/18/2014  Job:  [id number]

## 2014-12-19 LAB — CBC
HEMATOCRIT: 33 % — AB (ref 36.0–46.0)
HEMOGLOBIN: 11.3 g/dL — AB (ref 12.0–15.0)
MCH: 32.3 pg (ref 26.0–34.0)
MCHC: 34.2 g/dL (ref 30.0–36.0)
MCV: 94.3 fL (ref 78.0–100.0)
PLATELETS: 107 10*3/uL — AB (ref 150–400)
RBC: 3.5 MIL/uL — AB (ref 3.87–5.11)
RDW: 12.1 % (ref 11.5–15.5)
WBC: 7 10*3/uL (ref 4.0–10.5)

## 2014-12-19 LAB — BASIC METABOLIC PANEL
Anion gap: 6 (ref 5–15)
BUN: <5 mg/dL — ABNORMAL LOW (ref 6–23)
CHLORIDE: 104 mmol/L (ref 96–112)
CO2: 25 mmol/L (ref 19–32)
Calcium: 7.8 mg/dL — ABNORMAL LOW (ref 8.4–10.5)
Creatinine, Ser: 0.52 mg/dL (ref 0.50–1.10)
GFR calc Af Amer: >90 mL/min (ref >90)
GLUCOSE: 105 mg/dL — AB (ref 70–99)
POTASSIUM: 3.3 mmol/L — AB (ref 3.5–5.1)
SODIUM: 135 mmol/L (ref 135–145)

## 2014-12-19 MED ORDER — DIPHENHYDRAMINE HCL 25 MG PO CAPS
25.0000 mg | ORAL_CAPSULE | Freq: Three times a day (TID) | ORAL | Status: DC | PRN
  Administered 2014-12-19: 25 mg via ORAL
  Filled 2014-12-19: qty 1

## 2014-12-19 NOTE — Progress Notes (Signed)
SPORTS MEDICINE AND JOINT REPLACEMENT  Lara Mulch, MD   Carlynn Spry, PA-C [place], [place], [place]  [address]                             [phone]   PROGRESS NOTE  Subjective:  negative for Chest Pain  negative for Shortness of Breath  negative for Nausea/Vomiting   negative for Calf Pain  negative for Bowel Movement   Tolerating Diet: yes         Patient reports pain as 5 on 0-10 scale.    Objective: Vital signs in last 24 hours:   Patient Vitals for the past 24 hrs:  BP Temp Temp src Pulse Resp SpO2  12/19/14 0520 118/77 mmHg 98.8 F (37.1 C) Oral 88 16 98 %  12/18/14 2138 (!) 135/91 mmHg 99.6 F (37.6 C) Oral 91 17 96 %  12/18/14 1456 137/78 mmHg 99.9 F (37.7 C) Oral 100 16 99 %  12/18/14 0920 124/80 mmHg (!) 100.5 F (38.1 C) Oral 99 16 95 %    @flow {1959:LAST@   Intake/Output from previous day:   02/20 0701 - 02/21 0700 In: -  Out: 1450 [Urine:1450]   Intake/Output this shift:       Intake/Output      02/20 0701 - 02/21 0700 02/21 0701 - 02/22 0700   P.O.     I.V. (mL/kg)     Total Intake(mL/kg)     Urine (mL/kg/hr) 1450 (0.8)    Drains     Total Output 1450     Net -1450          Urine Occurrence 2 x       LABORATORY DATA:  Recent Labs  12/16/14 1620 12/16/14 1626 12/17/14 0408 12/18/14 0443 12/19/14 0440  WBC 16.6*  --  7.8 8.0 7.0  HGB 13.3 14.3 11.3* 10.8* 11.3*  HCT 37.7 42.0 32.3* 31.0* 33.0*  PLT 205  --  171 120* 107*    Recent Labs  12/16/14 1620 12/16/14 1626 12/17/14 0408 12/19/14 0440  NA 137 141 140 135  K 3.6 3.6 3.6 3.3*  CL 107 105 111 104  CO2 22  --  24 25  BUN 12 14 8  <5*  CREATININE 0.64 0.70 0.54 0.52  GLUCOSE 138* 139* 133* 105*  CALCIUM 8.4  --  7.9* 7.8*   Lab Results  Component Value Date   INR 1.02 12/16/2014    Examination:  General appearance: alert, cooperative and no distress Extremities: Homans sign is negative, no sign of DVT  Wound Exam: clean, dry, intact    Drainage:  None: wound tissue dry  Motor Exam: EHL and FHL Intact  Sensory Exam: Deep Peroneal normal   Assessment:    2 Days Post-Op  Procedure(s) (LRB): OPEN REDUCTION INTERNAL FIXATION (ORIF) WRIST FRACTURE (Left) SACRO-ILIAC PINNING (Left)  ADDITIONAL DIAGNOSIS:  Active Problems:   Splenic laceration   Fall   Left wrist fracture   Lumbar transverse process fracture   Sacral fracture  Acute Blood Loss Anemia   Plan: Ms. Virgilio Frees will be nonweightbearing on the right lower extremity with nonweightbearing on the right with use of a platform walker on the left. She will progress to partial weightbearing at 8 weeks and will be full by 3 months. She will be on formal DVT prophylaxis pharmacologically and will be followed by the Trauma Service.  Colton Tassin 12/19/2014, 9:19 AM

## 2014-12-19 NOTE — Evaluation (Signed)
Occupational Therapy Evaluation Patient Details Name: Hailey Walsh MRN: [id number] DOB: 12/25/1980 Today's Date: 12/19/2014    History of Present Illness pt presents after fall off ladder 20' sustaining L wrist fx s/p ORIF, L2-5 TVP fxs, and R Sacral fx s/p R to L SI pinning.     Clinical Impression   PTA pt lived at home and was independent with ADLs. Pt currently limited by pain and decreased ROM which impair her independence with ADLs. Pt will benefit from acute OT to address functional mobility and independence with ADLs as well as functional use of LUE. Pt is a good CIR candidate for intensive therapy to return to Mod I level.    Follow Up Recommendations  CIR;Supervision/Assistance - 24 hour    Equipment Recommendations  3 in 1 bedside comode    Recommendations for Other Services       Precautions / Restrictions Precautions Precautions: Fall;Back Precaution Booklet Issued: No Restrictions Weight Bearing Restrictions: Yes LUE Weight Bearing: Weight bear through elbow only RLE Weight Bearing: Non weight bearing LLE Weight Bearing: Weight bearing as tolerated      Mobility Bed Mobility Overal bed mobility: Needs Assistance;+2 for physical assistance Bed Mobility: Rolling;Sidelying to Sit Rolling: Min assist Sidelying to sit: Min assist;HOB elevated       General bed mobility comments: Hand held assist with RUE to roll to side. VC's for sequencing. Min A to elevate trunk however pt able to push up with RUE on bed rail.   Transfers Overall transfer level: Needs assistance Equipment used: Left platform walker Transfers: Sit to/from Stand Sit to Stand: Min assist;+2 physical assistance         General transfer comment: Min A to stabilize RW, however pt able to maintain NWB on RLE when powering up. Controlled ascent and descent.          ADL Overall ADL's : Needs assistance/impaired Eating/Feeding: Set up;Sitting Eating/Feeding Details (indicate  cue type and reason): assist to cut food and open containers Grooming: Oral care;Brushing hair;Set up;Sitting   Upper Body Bathing: Minimal assitance;Sitting   Lower Body Bathing: Sit to/from stand;Moderate assistance   Upper Body Dressing : Moderate assistance;Sitting   Lower Body Dressing: Maximal assistance;Sit to/from stand   Toilet Transfer: Minimal assistance;Ambulation;RW;Grab bars (BSC over toilet) Toilet Transfer Details (indicate cue type and reason): Pt ambulated from bed to toilet using heell-toe scooting method.  Toileting- Clothing Manipulation and Hygiene: Minimal assistance;Sit to/from stand       Functional mobility during ADLs: Minimal assistance;Rolling walker General ADL Comments: Min A to manage RW at times.      Vision Additional Comments: pt reports HA and eye strain likely related to not having her glasses here.           Pertinent Vitals/Pain Pain Assessment: 0-10 Pain Score: 8  Pain Location: neck, HA, Left lower back Pain Descriptors / Indicators: Grimacing Pain Intervention(s): Limited activity within patient's tolerance;Monitored during session;Repositioned;RN gave pain meds during session     Hand Dominance Right   Extremity/Trunk Assessment Upper Extremity Assessment Upper Extremity Assessment: LUE deficits/detail LUE Deficits / Details: WB through elbow only; splinted from forearm to MCPs. Edema noted in fingers, limiting composite digit flexion/extension ROM; elbow and shoulder WFL LUE: Unable to fully assess due to immobilization LUE Coordination: decreased fine motor   Lower Extremity Assessment Lower Extremity Assessment: Defer to PT evaluation   Cervical / Trunk Assessment Cervical / Trunk Assessment: Normal   Communication Communication Communication: Prefers language other than [place]  Cognition Arousal/Alertness: Awake/alert Behavior During Therapy: WFL for tasks assessed/performed Overall Cognitive Status: Within  Functional Limits for tasks assessed                        Exercises  Encouraged frequent ROM of fingers, elbow, and shoulder throughout the day.           Home Living Family/patient expects to be discharged to:: Unsure Living Arrangements: Spouse/significant other;Children;Other relatives                               Additional Comments: pt states she has 5 steps without a railing to enter her home, but that home is on one level once inside.        Prior Functioning/Environment Level of Independence: Independent             OT Diagnosis: Generalized weakness;Acute pain   OT Problem List: Decreased strength;Decreased range of motion;Decreased activity tolerance;Impaired balance (sitting and/or standing);Impaired UE functional use;Pain   OT Treatment/Interventions: Self-care/ADL training;Therapeutic exercise;Energy conservation;DME and/or AE instruction;Therapeutic activities;Patient/family education;Balance training    OT Goals(Current goals can be found in the care plan section) Acute Rehab OT Goals Patient Stated Goal: back to independence OT Goal Formulation: With patient Time For Goal Achievement: 01/02/15 Potential to Achieve Goals: Good ADL Goals Pt Will Perform Grooming: with set-up;with supervision;standing Pt Will Perform Upper Body Bathing: with set-up;with supervision;sitting Pt Will Perform Lower Body Bathing: sit to/from stand;with set-up;with supervision Pt Will Perform Upper Body Dressing: with set-up;with supervision;sitting Pt Will Perform Lower Body Dressing: sit to/from stand;with min assist Pt Will Transfer to Toilet: with supervision;ambulating;bedside commode Pt Will Perform Toileting - Clothing Manipulation and hygiene: with supervision;sit to/from stand Pt/caregiver will Perform Home Exercise Program: Increased ROM;Left upper extremity;Independently  OT Frequency: Min 2X/week           Co-evaluation PT/OT/SLP  Co-Evaluation/Treatment: Yes Reason for Co-Treatment: For patient/therapist safety   OT goals addressed during session: ADL's and self-care      End of Session Equipment Utilized During Treatment: Gait belt;Other (comment) (left platform RW) Nurse Communication: Patient requests pain meds  Activity Tolerance: Patient tolerated treatment well Patient left: in chair;with call bell/phone within reach   Time: 0824-0906 OT Time Calculation (min): 42 min Charges:  OT General Charges $OT Visit: 1 Procedure OT Evaluation $Initial OT Evaluation Tier I: 1 Procedure OT Treatments $Self Care/Home Management : 8-22 mins G-Codes:    Juluis Rainier 12/31/2014, 9:27 AM  Cyndie Chime, OTR/L Occupational Therapist [phone] (pager)

## 2014-12-19 NOTE — Progress Notes (Signed)
Patient ID: Hailey Walsh, female   DOB: 13-May-1981, 34 y.o.   MRN: [id number] 2 Days Post-Op  Subjective: Pt feels ok today.  Pain well controlled.  Tolerating a regular diet  Objective: Vital signs in last 24 hours: Temp:  [98.8 F (37.1 C)-99.9 F (37.7 C)] 98.8 F (37.1 C) (02/21 0520) Pulse Rate:  [88-100] 88 (02/21 0520) Resp:  [16-17] 16 (02/21 0520) BP: (118-137)/(77-91) 118/77 mmHg (02/21 0520) SpO2:  [96 %-99 %] 98 % (02/21 0520) Last BM Date: 10/16/15  Intake/Output from previous day: 02/20 0701 - 02/21 0700 In: -  Out: 1450 [Urine:1450] Intake/Output this shift:    PE: Gen: NAD HEart: regular Lungs: CTAB Ext: LUE in splint, NVI Abd: soft, NT, ND  Lab Results:   Recent Labs  12/18/14 0443 12/19/14 0440  WBC 8.0 7.0  HGB 10.8* 11.3*  HCT 31.0* 33.0*  PLT 120* 107*   BMET  Recent Labs  12/17/14 0408 12/19/14 0440  NA 140 135  K 3.6 3.3*  CL 111 104  CO2 24 25  GLUCOSE 133* 105*  BUN 8 <5*  CREATININE 0.54 0.52  CALCIUM 7.9* 7.8*   PT/INR  Recent Labs  12/16/14 1620  LABPROT 13.5  INR 1.02   CMP     Component Value Date/Time   NA 135 12/19/2014 0440   K 3.3* 12/19/2014 0440   CL 104 12/19/2014 0440   CO2 25 12/19/2014 0440   GLUCOSE 105* 12/19/2014 0440   BUN <5* 12/19/2014 0440   CREATININE 0.52 12/19/2014 0440   CALCIUM 7.8* 12/19/2014 0440   PROT 5.4* 12/17/2014 0408   ALBUMIN 3.2* 12/17/2014 0408   AST 55* 12/17/2014 0408   ALT 53* 12/17/2014 0408   ALKPHOS 48 12/17/2014 0408   BILITOT 1.1 12/17/2014 0408   GFRNONAA >90 12/19/2014 0440   GFRAA >90 12/19/2014 0440   Lipase     Component Value Date/Time   LIPASE 33 12/16/2014 1620       Studies/Results: Dg Pelvis 1-2 Views  12/17/2014   CLINICAL DATA:  Right pelvis nail for fracture.  EXAM: DG C-ARM 61-120 MIN; PELVIS - 1-2 VIEW  COMPARISON:  Radiography from the same day.  FINDINGS: From the right a cannulated screw was placed through the sacroiliac  joints and across the fractured sacrum. Fracture alignment is anatomic.  IMPRESSION: Status post open fixation of a right sacral fracture.   Electronically Signed   By: Monte Fantasia M.D.   On: 12/17/2014 18:14   Dg Pelvis Comp Min 3v  12/17/2014   CLINICAL DATA:  Postop.  EXAM: JUDET PELVIS - 3+ VIEW  COMPARISON:  12/17/2014 earlier.  FINDINGS: Examination demonstrates interval placement of a orthopedic screw from right to left bridging patient's known right sacral fracture. The right sacral fracture is otherwise unchanged. There is evidence of patient's displaced right transverse process fracture of L5. Remainder of the exam is unchanged.  IMPRESSION: Internal fixation of patient's right sacral fracture.  Known right L5 transverse process fracture.   Electronically Signed   By: Marin Olp M.D.   On: 12/17/2014 21:33   Dg C-arm 1-60 Min  12/17/2014   CLINICAL DATA:  Right pelvis nail for fracture.  EXAM: DG C-ARM 61-120 MIN; PELVIS - 1-2 VIEW  COMPARISON:  Radiography from the same day.  FINDINGS: From the right a cannulated screw was placed through the sacroiliac joints and across the fractured sacrum. Fracture alignment is anatomic.  IMPRESSION: Status post open fixation of a right sacral fracture.  Electronically Signed   By: Monte Fantasia M.D.   On: 12/17/2014 18:14    Anti-infectives: Anti-infectives    Start     Dose/Rate Route Frequency Ordered Stop   12/18/14 0800  ceFAZolin (ANCEF) IVPB 1 g/50 mL premix     1 g 100 mL/hr over 30 Minutes Intravenous 3 times per day 12/18/14 4765         Assessment/Plan Fall Left wrist fx -- for ORIF by Dr. Amedeo Plenty, NWB Grade 2 splenic lac - diet as tolerated. Hgb stable.   Multiple L-spine TVP fxs Right sacral fx -- s/p OR with Dr. Marcelino Scot, NWB to RLE ABL anemia, stable Hx/o DVT -- Restart Xarelto once hgb stable x72h, can likely resume tomorrow FEN -- regular diet Dispo -- PT/OT, CIR rehab  LOS: 3 days    Hailey Walsh E 12/19/2014,  11:30 AM Pager: [phone]

## 2014-12-19 NOTE — Progress Notes (Signed)
Physical Therapy Treatment Patient Details Name: Hailey Walsh MRN: [id number] DOB: 1981/01/16 Today's Date: 12/19/2014    History of Present Illness pt presents after fall off ladder 20' sustaining L wrist fx s/p ORIF, L2-5 TVP fxs, and R Sacral fx s/p R to L SI pinning.      PT Comments    Noting excellent progress today with much better ability to advance LLE while keeping NWB RLE; Still, noted pain and fatigue limiting overall distance; Continue to recommend comprehensive inpatient rehab (CIR) for post-acute therapy needs.   Follow Up Recommendations  CIR     Equipment Recommendations   (L platform RW)    Recommendations for Other Services Rehab consult     Precautions / Restrictions Precautions Precautions: Fall;Back Precaution Booklet Issued: No Restrictions Weight Bearing Restrictions: Yes LUE Weight Bearing: Weight bear through elbow only RLE Weight Bearing: Non weight bearing LLE Weight Bearing: Weight bearing as tolerated    Mobility  Bed Mobility Overal bed mobility: Needs Assistance;+2 for physical assistance Bed Mobility: Rolling;Sidelying to Sit Rolling: Min assist Sidelying to sit: Min assist;HOB elevated       General bed mobility comments: Hand held assist with RUE to roll to side. VC's for sequencing. Min A to elevate trunk however pt able to push up with RUE on bed rail.   Transfers Overall transfer level: Needs assistance Equipment used: Left platform walker Transfers: Sit to/from Stand Sit to Stand: Min assist;+2 physical assistance         General transfer comment: Min A to stabilize RW, however pt able to maintain NWB on RLE when powering up. Controlled ascent and descent. Cues for hand placement and safety  Ambulation/Gait Ambulation/Gait assistance: Min assist;+2 safety/equipment Ambulation Distance (Feet): 10 Feet Assistive device: Left platform walker Gait Pattern/deviations: Step-to pattern     General Gait Details:  Verbal and demonstrational cues to put all of her body weight onto platform and RW handle to keep NWB while advancing steps on LLE; occasional "toe-heel steps" on LLE   Stairs            Wheelchair Mobility    Modified Rankin (Stroke Patients Only)       Balance     Sitting balance-Leahy Scale: Fair       Standing balance-Leahy Scale: Poor                      Cognition Arousal/Alertness: Awake/alert Behavior During Therapy: WFL for tasks assessed/performed Overall Cognitive Status: Within Functional Limits for tasks assessed                      Exercises      General Comments        Pertinent Vitals/Pain Pain Assessment: 0-10 Pain Score: 8  Pain Location: Neck HA, lower back Pain Descriptors / Indicators: Discomfort;Grimacing Pain Intervention(s): Limited activity within patient's tolerance;Monitored during session;RN gave pain meds during session    Home Living Family/patient expects to be discharged to:: Unsure Living Arrangements: Spouse/significant other;Children;Other relatives             Additional Comments: pt states she has 5 steps without a railing to enter her home, but that home is on one level once inside.      Prior Function Level of Independence: Independent          PT Goals (current goals can now be found in the care plan section) Acute Rehab PT Goals Patient Stated Goal: back to independence  PT Goal Formulation: With patient Time For Goal Achievement: 01/01/15 Potential to Achieve Goals: Good Progress towards PT goals: Progressing toward goals    Frequency  Min 5X/week    PT Plan Current plan remains appropriate    Co-evaluation PT/OT/SLP Co-Evaluation/Treatment: Yes Reason for Co-Treatment: For patient/therapist safety PT goals addressed during session: Mobility/safety with mobility OT goals addressed during session: ADL's and self-care     End of Session Equipment Utilized During Treatment: Gait  belt Activity Tolerance: Patient limited by pain Patient left: Other (comment) (with OT in bathroom)     Time: 5643-3295 PT Time Calculation (min) (ACUTE ONLY): 24 min  Charges:  $Gait Training: 8-22 mins                    G Codes:      Quin Hoop 12/19/2014, 10:26 AM  Roney [place], PT  Acute Rehabilitation Services Pager [phone] Office [phone]

## 2014-12-19 NOTE — Progress Notes (Signed)
Rehab Admissions Coordinator Note:  Patient was screened by Cleatrice Burke for appropriateness for an Inpatient Acute Rehab Consult per PT and OT recommendaitons.  At this time, we are recommending Inpatient Rehab consult.  Cleatrice Burke 12/19/2014, 1:10 PM  I can be reached at [phone].

## 2014-12-20 MED ORDER — RIVAROXABAN 20 MG PO TABS
20.0000 mg | ORAL_TABLET | Freq: Every day | ORAL | Status: DC
  Administered 2014-12-20: 20 mg via ORAL
  Filled 2014-12-20: qty 1

## 2014-12-20 MED ORDER — HYDROMORPHONE HCL 1 MG/ML IJ SOLN
0.5000 mg | INTRAMUSCULAR | Status: DC | PRN
  Administered 2014-12-20: 0.5 mg via INTRAVENOUS
  Filled 2014-12-20: qty 1

## 2014-12-20 MED ORDER — RIVAROXABAN 20 MG PO TABS
20.0000 mg | ORAL_TABLET | Freq: Every day | ORAL | Status: DC
  Administered 2014-12-21: 20 mg via ORAL
  Filled 2014-12-20: qty 1

## 2014-12-20 MED ORDER — OXYCODONE HCL 5 MG PO TABS
5.0000 mg | ORAL_TABLET | ORAL | Status: DC | PRN
  Administered 2014-12-20 (×2): 15 mg via ORAL
  Administered 2014-12-21 (×3): 10 mg via ORAL
  Filled 2014-12-20 (×2): qty 2
  Filled 2014-12-20 (×2): qty 3
  Filled 2014-12-20: qty 2

## 2014-12-20 NOTE — Progress Notes (Addendum)
Physical Therapy Treatment Patient Details Name: Hailey Walsh MRN: [id number] DOB: 1981/09/10 Today's Date: 12/20/2014    History of Present Illness pt presents after fall off ladder 20' sustaining L wrist fx s/p ORIF, L2-5 TVP fxs, and R Sacral fx s/p R to L SI pinning.      PT Comments    Making excellent progress today; Noted not a candidate for CIR, so we discussed possibilities for managing at home; Case Mgmnt: Will it be possible to have HHPT and HHOT follow up in the home?  Will add wheelchair goal   Follow Up Recommendations  Home health PT;Supervision - Intermittent     Equipment Recommendations  Wheelchair (measurements PT);Wheelchair cushion (measurements PT) (L platform RW; shower seat)    Recommendations for Other Services       Precautions / Restrictions Precautions Precautions: Fall;Back Precaution Comments: Back precautions for comfort Restrictions LUE Weight Bearing: Weight bear through elbow only RLE Weight Bearing: Non weight bearing LLE Weight Bearing: Weight bearing as tolerated    Mobility  Bed Mobility Overal bed mobility: Needs Assistance Bed Mobility: Supine to Sit     Supine to sit: Mod assist     General bed mobility comments: Hand held assist with RUE to roll to side. VC's for sequencing. Min A to elevate trunk however pt able to push up with RUE on bed rail. Painful , and required mod assist using bed pad to square off hips at EOB  Transfers Overall transfer level: Needs assistance Equipment used: Left platform walker Transfers: Sit to/from Stand Sit to Stand: Min assist         General transfer comment: Min A to stabilize RW, however pt able to maintain NWB on RLE when powering up. Controlled ascent and descent. Cues for hand placement and safety  Ambulation/Gait Ambulation/Gait assistance: Min assist Ambulation Distance (Feet): 60 Feet Assistive device: Left platform walker Gait Pattern/deviations: Step-to pattern      General Gait Details: Verbal and demonstrational cues to put all of her body weight onto platform and RW handle to keep NWB while advancing steps on LLE; occasional "toe-heel steps" on LLE, but predominantly taking true steps on L foot, and doing an excellent job keeping NWB RLE   Stairs Stairs:  (Discussed possibility of going up steps in wheelchair)          Wheelchair Mobility    Modified Rankin (Stroke Patients Only)       Balance                                    Cognition Arousal/Alertness: Awake/alert Behavior During Therapy: WFL for tasks assessed/performed Overall Cognitive Status: Within Functional Limits for tasks assessed                      Exercises      General Comments        Pertinent Vitals/Pain Pain Assessment: 0-10 Pain Score: 5  Pain Location: low back and R hip Pain Descriptors / Indicators: Aching;Discomfort;Grimacing Pain Intervention(s): Limited activity within patient's tolerance;Monitored during session;Repositioned    Home Living                      Prior Function            PT Goals (current goals can now be found in the care plan section) Acute Rehab PT Goals Patient Stated Goal: back  to independence PT Goal Formulation: With patient Time For Goal Achievement: 01/01/15 Potential to Achieve Goals: Good Progress towards PT goals: Progressing toward goals    Frequency  Min 5X/week    PT Plan Discharge plan needs to be updated    Co-evaluation             End of Session Equipment Utilized During Treatment: Gait belt Activity Tolerance: Patient tolerated treatment well Patient left: in chair;with call bell/phone within reach;with family/visitor present     Time: 8159-4707 PT Time Calculation (min) (ACUTE ONLY): 38 min  Charges:  $Gait Training: 23-37 mins $Therapeutic Activity: 8-22 mins                    G Codes:      Quin Hoop 12/20/2014, 3:49 PM  Roney [place], [place] Pager [phone] Office [phone]

## 2014-12-20 NOTE — Clinical Social Work Note (Signed)
Clinical Social Work Department BRIEF PSYCHOSOCIAL ASSESSMENT 12/20/2014  Patient:  Hailey Walsh, Hailey Walsh     Account Number:  [id number]     Admit date:  12/16/2014  Clinical Social Worker:  Myles Lipps  Date/Time:  12/20/2014 03:00 PM  Referred by:  Physician  Date Referred:  12/20/2014 Referred for  Psychosocial assessment   Other Referral:   Interview type:  Patient Other interview type:   Patient sister in law at bedside and interpreter used for language barrier    PSYCHOSOCIAL DATA Living Status:  FAMILY Admitted from facility:   Level of care:   Primary support name:  Hailey Walsh  [phone] Primary support relationship to patient:  SPOUSE Degree of support available:   Strong    CURRENT CONCERNS Current Concerns  None Noted   Other Concerns:    SOCIAL WORK ASSESSMENT / PLAN Clinical Social Worker met with patient and patient sister in law to offer support and discuss patient needs at discharge.  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  Patient states that she does not have insurance coverage through them and works on more of a temporary basis.  Patient does not feel that there will be a Worker's Comp case with fall. Patient states that she lives at home with her sister in law, daughter, and husband.  Patient husband works daily, however patient sister in law is home and able to assist as needed.  Patient husband to provide patient with transportation home once medically ready.    Clinical Social Worker inquired about current substance use.  Patient states that there was no alcohol involved at the time of her fall and no current use of drugs or alcohol at this time.  SBIRT complete.  No resources needed at this time.  Patient with good family support for discharge.  CSW signing off.  Please reconsult if further needs arise prior to discharge.   Assessment/plan status:  No Further Intervention Required Other  assessment/ plan:   Information/referral to community resources:   Patient is performing too well with therapies for inpatient rehab and has good support at home to assist as needed.  No further resources at this time - patient would be good home health candidate if possible    PATIENT'S/FAMILY'S RESPONSE TO PLAN OF CARE: Patient alert and oriented x3 sitting up in bed.  Patient is spanish speaking only (CSW used interpreting services). Patient grateful for conversation in [place].  Patient willing to share information and engaged in assessment process.  Patient comfortable with return home at discharge.  Patient verbalized understanding of CSW role and appreciation for support.

## 2014-12-20 NOTE — Progress Notes (Signed)
Orthopaedic Trauma Service Progress Note  Subjective  No complaints  Doing ok   ROS As  Above   Objective   BP 117/77 mmHg  Pulse 99  Temp(Src) 98.1 F (36.7 C) (Oral)  Resp 16  Ht 5\' 7"  (1.702 m)  Wt 77 kg (169 lb 12.1 oz)  BMI 26.58 kg/m2  SpO2 97%  LMP 12/09/2014  Intake/Output      02/21 0701 - 02/22 0700 02/22 0701 - 02/23 0700   I.V. (mL/kg) 910 (11.8) 1518.3 (19.7)   Total Intake(mL/kg) 910 (11.8) 1518.3 (19.7)   Urine (mL/kg/hr)     Total Output       Net +910 +1518.3        Urine Occurrence 8 x      Labs  No new labs   Exam  Gen: awake, alert, NAD Abd: +BS, NT Pelvis: dressing stable  Ext:       Right Lower Extremity   Distal motor and sensory functions intact  Ext warm   + DP pulse  No DCT      Assessment and Plan   POD/HD#: 43  34 y/o female s/p fall   1. Fall  2. Vertically unstable Pelvic Ring fracture s/p trans-sacral screw  NWB R leg  ROM as tolerated  Ice prn  PT/OT  3. L distal radius fx s/p ORIF   Per Dr. Amedeo Plenty  NWB L wrist   4. Splenic lac  Per TS  5. Hx of DVT  xarelto   6. Dispo  CIR recommends HHPT  Continue with therapies  Dc once mobilizing better    Jari Pigg, PA-C [facility] [phone] (O) 12/20/2014 10:00 AM

## 2014-12-20 NOTE — Progress Notes (Signed)
Patient ID: Hailey Walsh, female   DOB: 21-May-1981, 34 y.o.   MRN: [id number]   LOS: 4 days   Subjective: No c/o.   Objective: Vital signs in last 24 hours: Temp:  [98.1 F (36.7 C)-98.8 F (37.1 C)] 98.1 F (36.7 C) (02/22 0527) Pulse Rate:  [88-100] 99 (02/22 0527) Resp:  [16] 16 (02/22 0527) BP: (117-132)/(77-91) 117/77 mmHg (02/22 0527) SpO2:  [97 %-98 %] 97 % (02/22 0527) Last BM Date: 12/16/14   Physical Exam General appearance: alert and no distress Resp: clear to auscultation bilaterally Cardio: regular rate and rhythm GI: normal findings: bowel sounds normal and soft, non-tender Extremities: NVI   Assessment/Plan: Fall Left wrist fx s/p ORIF -- NWB per Dr. Amedeo Plenty Grade 2 splenic lac  Multiple L-spine TVP fxs Right sacral fx s/p SI screw -- NWB to RLE per Dr. Marcelino Scot ABL anemia -- Stable Hx/o DVT -- Restart Xarelto FEN -- D/C abx, SL IV Dispo -- CIR consult pending    Lisette Abu, PA-C Pager: [phone] General Trauma PA Pager: [phone]  12/20/2014

## 2014-12-20 NOTE — Progress Notes (Signed)
Inpatient Rehabilitation    I met with the patient, her sister in law and interpreter Graciela.  We discussed Dr. Naaman Plummer' recommendations that pt. does not meet medical necessity for IP Rehab, as well as at mostly a min assist level.  Pt. Will have her sister in law present to assist 24/7,  as they live together.  Pt. Will likely need home therapies for continued progress toward functional independence.  Pt. Asking about when she can bear weight on R LE and wrist.  I advised her that she should continue with NWB until cleared by orthopedics.  She voices understanding and agreement.  Denyse Amass Scinto present during my conversation with pt. And is aware of plans for home. Updated Sandi Mariscal by text.  I will sign off.  Please call if questions.  [facility] Admissions Coordinator Cell [phone] Office [phone]

## 2014-12-20 NOTE — Consult Note (Signed)
Physical Medicine and Rehabilitation Consult Reason for Consult: Left wrist fracture, splenic laceration, right sacral fracture and multiple transverse process fractures after a fall from a ladder Referring Physician: Trauma services   HPI: Hailey Walsh is a 34 y.o. 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 She speaks very little [place]. She lives with family and was independent prior to admission. Maintained on Xarelto for history of DVT August 2015. Cranial CT scan cervical spine films negative. X-rays and imaging revealed multiple transverse process fractures L2, L3 and 4 as well as right L5. Superior splenic laceration, left distal radius fracture with ORIF 12/17/2014 per Dr. Annie Main. Findings of pelvic ring fracture with complete disruption on the right and underwent trans-sacroiliac screw fixation right and left 12/18/2014 per Dr. Marcelino Scot. Patient is nonweightbearing right lower extremity and weightbearing through elbow only left upper extremity. Hospital course pain management.Xarelto has been on hold until hemoglobin stabilized and await plan to resume. Physical and occupational therapy evaluations completed with recommendations physical medicine rehabilitation consult.   Review of Systems  Unable to perform ROS: language   Past Medical History  Diagnosis Date  . Incompetent cervix 2011   Past Surgical History  Procedure Laterality Date  . Cervical cerclage  10/16/2010  . Abdominal surgery    . Orif wrist fracture Left 12/17/2014    Procedure: OPEN REDUCTION INTERNAL FIXATION (ORIF) WRIST FRACTURE;  Surgeon: Roseanne Kaufman, MD;  Location: [facility];  Service: Orthopedics;  Laterality: Left;  . Sacro-iliac pinning Left 12/17/2014    Procedure: Dub Mikes;  Surgeon: Rozanna Box, MD;  Location: [facility];  Service: Orthopedics;  Laterality: Left;   History reviewed. No pertinent family  history. Social History:  reports that she has been smoking Cigarettes.  She does not have any smokeless tobacco history on file. She reports that she does not drink alcohol or use illicit drugs. Allergies: No Known Allergies Medications Prior to Admission  Medication Sig Dispense Refill  . PRESCRIPTION MEDICATION Take 1 tablet by mouth daily. "Birth Control"    . aspirin 325 MG tablet Take 325 mg by mouth daily.    Marland Kitchen HYDROcodone-acetaminophen (NORCO/VICODIN) 5-325 MG per tablet Take 1 tablet by mouth 2 (two) times daily as needed for moderate pain.    . rivaroxaban (XARELTO) 20 MG TABS tablet Take 1 tablet (20 mg total) by mouth daily with supper. 90 tablet 3  . XARELTO STARTER PACK 15 & 20 MG TBPK Take 15-20 mg by mouth as directed. Take as directed on package: Start with one 15mg  tablet by mouth twice a day with food. On Day 22, switch to one 20mg  tablet once a day with food. 51 each 0    Home: Home Living Family/patient expects to be discharged to:: Unsure Living Arrangements: Spouse/significant other, Children, Other relatives Additional Comments: pt states she has 5 steps without a railing to enter her home, but that home is on one level once inside.    Functional History: Prior Function Level of Independence: Independent Functional Status:  Mobility: Bed Mobility Overal bed mobility: Needs Assistance, +2 for physical assistance Bed Mobility: Rolling, Sidelying to Sit Rolling: Min assist Sidelying to sit: Min assist, HOB elevated Sit to sidelying: Mod assist, +2 for physical assistance General bed mobility comments: Hand held assist with RUE to roll to side. VC's for sequencing. Min A to elevate trunk however pt able to push up with RUE on bed rail.  Transfers Overall transfer level: Needs assistance Equipment used: Left platform walker Transfers: Sit to/from Stand Sit to Stand: Min assist, +2 physical assistance Stand pivot transfers: Min assist, +2 physical  assistance General transfer comment: Min A to stabilize RW, however pt able to maintain NWB on RLE when powering up. Controlled ascent and descent. Cues for hand placement and safety Ambulation/Gait Ambulation/Gait assistance: Min assist, +2 safety/equipment Ambulation Distance (Feet): 10 Feet Assistive device: Left platform walker Gait Pattern/deviations: Step-to pattern General Gait Details: Verbal and demonstrational cues to put all of her body weight onto platform and RW handle to keep NWB while advancing steps on LLE; occasional "toe-heel steps" on LLE    ADL: ADL Overall ADL's : Needs assistance/impaired Eating/Feeding: Set up, Sitting Eating/Feeding Details (indicate cue type and reason): assist to cut food and open containers Grooming: Oral care, Brushing hair, Set up, Sitting Upper Body Bathing: Minimal assitance, Sitting Lower Body Bathing: Sit to/from stand, Moderate assistance Upper Body Dressing : Moderate assistance, Sitting Lower Body Dressing: Maximal assistance, Sit to/from stand Toilet Transfer: Minimal assistance, Ambulation, RW, Grab bars (BSC over toilet) Toilet Transfer Details (indicate cue type and reason): Pt ambulated from bed to toilet using heell-toe scooting method.  Toileting- Clothing Manipulation and Hygiene: Minimal assistance, Sit to/from stand Functional mobility during ADLs: Minimal assistance, Rolling walker General ADL Comments: Min A to manage RW at times.   Cognition: Cognition Overall Cognitive Status: Within Functional Limits for tasks assessed Orientation Level: Oriented to person Cognition Arousal/Alertness: Awake/alert Behavior During Therapy: WFL for tasks assessed/performed Overall Cognitive Status: Within Functional Limits for tasks assessed  Blood pressure 117/77, pulse 99, temperature 98.1 F (36.7 C), temperature source Oral, resp. rate 16, height 5\' 7"  (1.702 m), weight 77 kg (169 lb 12.1 oz), last menstrual period 12/09/2014,  SpO2 97 %. Physical Exam  Constitutional: She appears well-developed.  HENT:  Head: Normocephalic.  Eyes: EOM are normal.  Neck: Normal range of motion. Neck supple. No thyromegaly present.  Cardiovascular: Normal rate and regular rhythm.   Respiratory: Effort normal and breath sounds normal. No respiratory distress.  GI: Soft. Bowel sounds are normal. She exhibits no distension.  Neurological: She is alert.  Patient speaks very little [place]. She would follow demonstrated commands and answer simple questions for yes and no  Skin:  Left upper extremity was short arm splint    No results found for this or any previous visit (from the past 24 hour(s)). No results found.  Assessment/Plan: Diagnosis: polytrauma 1. Does the need for close, 24 hr/day medical supervision in concert with the patient's rehab needs make it unreasonable for this patient to be served in a less intensive setting? No 2. Co-Morbidities requiring supervision/potential complications:   3. Due to bladder management and bowel management, does the patient require 24 hr/day rehab nursing? No 4. Does the patient require coordinated care of a physician, rehab nurse, pt, ot to address physical and functional deficits in the context of the above medical diagnosis(es)? No Addressing deficits in the following areas: balance, endurance and locomotion 5. Can the patient actively participate in an intensive therapy program of at least 3 hrs of therapy per day at least 5 days per week? No 6. The potential for patient to make measurable gains while on inpatient rehab is fair 7. Anticipated functional outcomes upon discharge from inpatient rehab are n/a  with PT, n/a with OT, n/a with SLP. 8. Estimated rehab length of stay to reach the above functional goals is:  n/a 9.  Does the patient have adequate social supports and living environment to accommodate these discharge functional goals? Potentially 10. Anticipated D/C setting:  Other 11. Anticipated post D/C treatments: [facility] therapy 12. Overall Rehab/Functional Prognosis: good  RECOMMENDATIONS: This patient's condition is appropriate for continued rehabilitative care in the following setting: [facility] Therapy Patient has agreed to participate in recommended program. Potentially Note that insurance prior authorization may be required for reimbursement for recommended care.  Comment: 34 yo at min assist to occasional mod assist already. SNF is no social supports available.   Meredith Staggers, MD, [facility] 12/20/2014     12/20/2014

## 2014-12-21 MED ORDER — METHOCARBAMOL 500 MG PO TABS: 500.0000 mg | ORAL_TABLET | Freq: Four times a day (QID) | ORAL | Status: DC | PRN

## 2014-12-21 MED ORDER — OXYCODONE-ACETAMINOPHEN 7.5-325 MG PO TABS: 1.0000 | ORAL_TABLET | ORAL | Status: DC | PRN

## 2014-12-21 MED ORDER — RIVAROXABAN 20 MG PO TABS: 20.0000 mg | ORAL_TABLET | Freq: Every day | ORAL | Status: DC

## 2014-12-21 NOTE — Progress Notes (Signed)
Physical Therapy Treatment Patient Details Name: Hailey Walsh MRN: [id number] DOB: 10-03-1981 Today's Date: 12/21/2014    History of Present Illness pt presents after fall off ladder 20' sustaining L wrist fx s/p ORIF, L2-5 TVP fxs, and R Sacral fx s/p R to L SI pinning.      PT Comments    Session conducted with interpreter via phone [place], [phone]); Initiated wheelchair mobility training, and parts management, and pt verbalized understanding, but ultimately L sided pain limited her activity tolerance; Emphasized pt's progress and gave her my vote of confidence that she will manage well at home; I told her I would ask if she qualifies for HHPT, and she seemed quite relieved, saying "I want to make sure I don't do the wrong thing" -- later I confirmed with Case Mgr that unfortunately pt will not be getting HHPT follow up;  I feel pt will benefit from another session with PT and OT for more practice and education prior to going home without [place] follow-up (Although I continue to recommend HHPT as the best option for PT follow up)   Follow Up Recommendations  Home health PT;Supervision - Intermittent     Equipment Recommendations  Wheelchair (measurements PT);Wheelchair cushion (measurements PT) (L platform RW; shower seat)    Recommendations for Other Services       Precautions / Restrictions Precautions Precautions: Fall;Back Precaution Comments: Back precautions for comfort Restrictions LUE Weight Bearing: Weight bear through elbow only RLE Weight Bearing: Non weight bearing LLE Weight Bearing: Weight bearing as tolerated    Mobility  Bed Mobility Overal bed mobility: Needs Assistance Bed Mobility: Supine to Sit Rolling: Min assist         General bed mobility comments: Cues for technique; good observance of WB status LUE  Transfers Overall transfer level: Needs assistance Equipment used: Left platform walker Transfers: Sit to/from [employer]  Transfers Sit to Stand: Min guard   Squat pivot transfers: Min guard     General transfer comment: transferred bed to wheelchair via squat pivot with minguard assist; Cues for technqiue; overall managed well, and kept correct WB precautions; Sit<>Stand  with good pbservance of WB precautions as well  Ambulation/Gait Ambulation/Gait assistance: Min guard Ambulation Distance (Feet):  (pivot steps wheelchair to recliner) Assistive device: Left platform walker Gait Pattern/deviations:  (hop to)     General Gait Details: did not push amb today as pt describes L sided pain that takes her breath away; Still great job with the few steps we did take   Stairs Stairs:  (Discussed technique including where pt's husband and brother should hold RW while backwards roll/bumping her up the 3 steps to enter her home)          Information systems manager mobility: Yes Wheelchair parts: Supervision/cueing Distance:  (around obstacles in room) Wheelchair Assistance Details (indicate cue type and reason): Verbal cues for propulsion with RUE and steering with L foot; She understands the technique, but noted difficulty managing, and increased L sided pain; this is very likely related to the need for truncal musculature recruitment; limited by pain today  Modified Rankin (Stroke Patients Only)       Balance                                    Cognition Arousal/Alertness: Awake/alert Behavior During Therapy: WFL for tasks assessed/performed Overall Cognitive Status: Within Functional Limits for tasks assessed  Exercises      General Comments        Pertinent Vitals/Pain Pain Assessment: Faces Faces Pain Scale: Hurts even more Pain Location: low back, L side Pain Descriptors / Indicators: Aching Pain Intervention(s): Limited activity within patient's tolerance    Home Living                      Prior Function             PT Goals (current goals can now be found in the care plan section) Acute Rehab PT Goals Patient Stated Goal: back to independence PT Goal Formulation: With patient Time For Goal Achievement: 01/01/15 Potential to Achieve Goals: Good Progress towards PT goals: Progressing toward goals    Frequency  Min 5X/week    PT Plan Current plan remains appropriate    Co-evaluation             End of Session   Activity Tolerance: Patient tolerated treatment well;Patient limited by pain Patient left: in chair;with call bell/phone within reach     Time: 1136-1224 (minus apprx 10 mins) PT Time Calculation (min) (ACUTE ONLY): 48 min  Charges:  $Therapeutic Activity: 8-22 mins $Wheel Chair Management: 8-22 mins                    G Codes:      Quin Hoop 12/21/2014, 3:25 PM  Roney Marion, [place] Pager [phone] Office [phone]

## 2014-12-21 NOTE — Progress Notes (Signed)
Pt provided with [place] letter with spanish translation.  Also provided with Xarelto 30-day free card and $0 copay card to do her 43-month prophylactic treatment.  Pilar Plate with [facility] is managing the charity DME (L platform rolling walker, wheelchair and 3-in-1) and confirmed at 1630 that it should deliver tonight.  Sandi Mariscal, RN BSN MHA CCM  Case Manager, Trauma Service/Unit 39M [phone]

## 2014-12-21 NOTE — Progress Notes (Signed)
Pt c/o having difficulty having a bowel movement.  Pt states she strained for about 10 minutes and then stool was hard and had some blood in it.  PA notified.  Eliezer Bottom [place]

## 2014-12-21 NOTE — Discharge Summary (Signed)
Physician Discharge Summary  Patient ID: Hailey Walsh MRN: [id number] DOB/AGE: Jan 22, 1981 34 y.o.  Admit date: 12/16/2014 Discharge date: 12/21/2014  Discharge Diagnoses Patient Active Problem List   Diagnosis Date Noted  . Fall 12/17/2014  . Left wrist fracture 12/17/2014  . Lumbar transverse process fracture 12/17/2014  . Sacral fracture 12/17/2014  . Splenic laceration 12/16/2014  . DVT (deep venous thrombosis) 06/09/2014  . Postpartum perineal pain 06/07/2011  . LEUKORRHEA 09/28/2010    Consultants Dr. Roseanne Kaufman for hand surgery  Drs. Jean Rosenthal and Altamese [place] for orthopedic surgery  Dr. Alger Simons for PM&R   Procedures 2/19 -- Transsacral iliac screw fixation, right and left by Dr. Marcelino Scot  2/19 -- Open reduction and internal fixation, left distal radius fracture comminuted complex greater than five-part, and stress radiography/AP, lateral, and oblique x-rays performed, examined, and interpreted by Dr. Amedeo Plenty   HPI: Hailey Walsh fell 20 ft off of a ladder. There was no loss of consciousness or hypotension. She was brought in as a level 2 trauma activation. Her workup included CT scans of the head, cervical spine, chest, abdomen, and pelvis as well as extremity x-rays and showed the above-mentioned injuries. She was admitted by the trauma service and orthopedic and hand surgery were consulted.    Hospital Course: Both hand and orthopedic surgery recommended operative fixation for her fractures. She was taken to surgery the following day for a combined procedure. Following that she was convalesced on the floor and had her pain controlled with oral medications. She was mobilized with physical and occupational therapies who recommended inpatient rehabilitation. They were consulted but felt the patient was progressing too well to qualify. Therefore they recommended home with home health therapies but she couldn't afford that but felt that she would do ok with  the help she had at home. She had been on Xarelto for a DVT contracted as a result of a previous ankle fracture but had stopped taking it. She was restarted here and advised to continue it under the direction of her PCP. She was discharged home in good condition in the care of her family.     Medication List    STOP taking these medications        aspirin 325 MG tablet     HYDROcodone-acetaminophen 5-325 MG per tablet  Commonly known as:  NORCO/VICODIN      TAKE these medications        methocarbamol 500 MG tablet  Commonly known as:  ROBAXIN  Take 1-2 tablets (500-1,000 mg total) by mouth every 6 (six) hours as needed for muscle spasms.     oxyCODONE-acetaminophen 7.5-325 MG per tablet  Commonly known as:  PERCOCET  Take 1-2 tablets by mouth every 4 (four) hours as needed for pain.     PRESCRIPTION MEDICATION  Take 1 tablet by mouth daily. "Birth Control"     rivaroxaban 20 MG Tabs tablet  Commonly known as:  XARELTO  Take 1 tablet (20 mg total) by mouth daily with supper.            Follow-up Information    Follow up with Paulene Floor, MD.   Specialty:  Orthopedic Surgery   Contact information:   [address] [place] [address] [place] [address] [phone]       Follow up with Rozanna Box, MD.   Specialty:  Orthopedic Surgery   Contact information:   [place] [address] [phone]  Follow up with Lorayne Marek, MD.   Specialty:  Internal Medicine   Contact information:   [place] [address] [phone]       Follow up with [facility].   Why:  As needed   Contact information:   [address] [address] [phone]       Signed: Lisette Abu, PA-C Pager: [phone] General Trauma PA Pager: [phone] 12/21/2014, 3:03 PM

## 2014-12-21 NOTE — Progress Notes (Addendum)
Discharge instructions reviewed with pt via [place] interpreters and prescriptions given.  Pt verbalized understanding and questions answered.  [place] equipment delivered.  Pt discharged in stable condition via wheelchair with family.  Eliezer Bottom [place]

## 2014-12-21 NOTE — Progress Notes (Signed)
Subjective: 4 Days Post-Op Procedure(s) (LRB): OPEN REDUCTION INTERNAL FIXATION (ORIF) WRIST FRACTURE (Left) SACRO-ILIAC PINNING (Left) Patient reports pain as minimal.  She does complain of mild pain about the posterior aspect of her elbow with weightbearing.  She denies numbness or tingling of the hand.  Overall, she feels better.  Objective: Vital signs in last 24 hours: Temp:  [97.8 F (36.6 C)-98.8 F (37.1 C)] 98.8 F (37.1 C) (02/23 0500) Pulse Rate:  [84-103] 103 (02/23 0500) Resp:  [16] 16 (02/23 0500) BP: (120-124)/(72-81) 120/72 mmHg (02/23 0500) SpO2:  [96 %-100 %] 100 % (02/23 0500)  Intake/Output from previous day: 02/22 0701 - 02/23 0700 In: 2238.3 [P.O.:720; I.V.:1518.3] Out: -  Intake/Output this shift:     Recent Labs  12/19/14 0440  HGB 11.3*    Recent Labs  12/19/14 0440  WBC 7.0  RBC 3.50*  HCT 33.0*  PLT 107*    Recent Labs  12/19/14 0440  NA 135  K 3.3*  CL 104  CO2 25  BUN <5*  CREATININE 0.52  GLUCOSE 105*  CALCIUM 7.8*   No results for input(s): LABPT, INR in the last 72 hours.  The patient is pleasant, no acute distress, alert and oriented Examination of the left upper extremity reveals that her spleen is clean and intact.  She has minimal edema about the digits.  She has excellent flexion and extension about the digits.  Sensation and refill are intact.  There are no signs of infection present.  Evaluation of the posterior aspect of the elbow show she is mildly point tender over the olecranon process she has excellent elbow range of motion and flexion and extension, supination and pronation are somewhat limited given the wrist fracture.  Shoulder is nontender  Assessment/Plan: 4 Days Post-Op Procedure(s) (LRB): OPEN REDUCTION INTERNAL FIXATION (ORIF) WRIST FRACTURE (Left) SACRO-ILIAC PINNING (Left) We had discussed with the patient following up at our office in 1-1/2-2 weeks.  She will need her dressings changed at that juncture  sutures removed, and radiographs obtained.  We will plan for an additional 2 weeks in a cast and at 4 weeks we'll transition into a removable brace and begin some very gentle wrist range of motion.  I have discussed with her and will take approximately 8 weeks for her bone to be healed and an additional month to 2 months for therapy to restore her motion and strength.  She will keep her cast clean, dry and intact.  She will not remove this.  She will bag this up for showers and baths.  She will avoid placing pressure on the posterior olecranon and utilize her forearm for weightbearing as needed.  However, she will not do any aggressive lifting gripping twisting position or pulling, given her fracture process.  Discussed discharge instructions at length.  She states good understanding.  We look forward to seeing her in our office in 1-1/2-2 weeks.  She will bring a family member or friend with her to the visit that speaks [place] we have discussed with her this is very important.  Tiffanni Scarfo L 12/21/2014, 9:18 AM

## 2014-12-21 NOTE — Progress Notes (Signed)
Went by to speak to patient about her Parach[place].  Was able to determine from patient that she was on Xarelto in the past. She had an [employer] in the past, but when she went to get a new one, they told her she didn't need to continue the Xarelto so the [employer] was not renewed.   Told her I would ask Case Management to come by and see if they could give her a free 30-day card, and then she would need to follow up with her primary care doctor to renew the [place] in order to keep filling the medication for 3-6 months.  She understood and was appreciative of this.   I spoke with Sharyn Lull from Care Management who is going to see if she has a card to give the patient. If not, we discussed other medication options that could be used if those would be cheaper for Ms. Melchor-Miralrio. Would coordinate any change in medication therapy with Trauma team.  Thank you to Care Management for coordinating this to ensure patient does not have a lapse in therapy!  Please page me if there are any questions, or if I can be of any further assistance.  Of note, patient was given an instruction sheet about Xarelto 20mg  in Spanish. She did not have any questions about the medication or its side effects.  Burnett Spray D. Brylynn Hanssen, PharmD, BCPS Clinical Pharmacist Pager: [phone] 12/21/2014 12:05 PM

## 2014-12-21 NOTE — Progress Notes (Addendum)
Occupational Therapy Treatment Patient Details Name: Hailey Walsh MRN: [id number] DOB: [birth year] Today's Date: 12/21/2014    History of present illness pt presents after fall off ladder 20' sustaining L wrist fx s/p ORIF, L2-5 TVP fxs, and R Sacral fx s/p R to L SI pinning.     OT comments  Pt progressing. Education provided in session and AE given. Called case manager and told her equipment recommendation. Used phone pacific spanish interpreter-Enoch [phone]  Follow Up Recommendations  Home health OT;Supervision - Intermittent-OOB/mobility   Equipment Recommendations  3 in 1 bedside comode    Recommendations for Other Services      Precautions / Restrictions Precautions Precautions: Fall;Back Precaution Booklet Issued: No Precaution Comments: explained LB technique; explained no pushing, pulling, lifting with Lt UE but can use to hold light items Restrictions Weight Bearing Restrictions: Yes LUE Weight Bearing: Weight bear through elbow only RLE Weight Bearing: Non weight bearing LLE Weight Bearing: Weight bearing as tolerated       Mobility Bed Mobility         General bed mobility comments: not assessed  Transfers Overall transfer level: Needs assistance Equipment used: Left platform walker Transfers: Sit to/from Stand Sit to Stand: Min guard       General transfer comment: cues for hand placement/precautions.        ADL Overall ADL's : Needs assistance/impaired     Grooming: Oral care;Standing;Min guard Grooming Details (indicate cue type and reason): cues for NWB on Rt LE.             Lower Body Dressing: Supervision/safety;Set up;Sitting/lateral leans-donned sock with sockaid Lower Body Dressing Details (indicate cue type and reason): pt able to bend down to socks, but was painful-attempted crossing right leg over knee and also was painful. Toilet Transfer: Min guard;Ambulation;RW;BSC   Toileting- Water quality scientist and Hygiene:  Min guard;Sit to/from stand         General ADL Comments: Educated on AE and gave pt kit from supply. educated on 3 in 1 and how it can be used. Educated on safety such as rugs. Educated on dressing technique. Had pt try to cross legs for LB dressing, but caused pain and bending over hurt as well. Educated on edema management techniques for Lt UE and elevated on pillows at end of session.  Explained baby powder may help with sockaid and allow sock to go on easier.      Vision                     Perception     Praxis      Cognition  Awake/Alert Behavior During Therapy: WFL for tasks assessed/performed Overall Cognitive Status: Within Functional Limits for tasks assessed                       Extremity/Trunk Assessment               Exercises  retrograde massage on Lt digits; pt moving digits   Shoulder Instructions       General Comments      Pertinent Vitals/ Pain       Pain Assessment: 0-10 Pain Score: 5  Pain Location: back and Lt arm Pain Intervention(s): Repositioned;Monitored during session  Home Living  Prior Functioning/Environment              Frequency Min 2X/week     Progress Toward Goals  OT Goals(current goals can now be found in the care plan section)  Progress towards OT goals: Progressing toward goals  Acute Rehab OT Goals Patient Stated Goal: not stated OT Goal Formulation: With patient Time For Goal Achievement: 01/02/15 Potential to Achieve Goals: Good ADL Goals Pt Will Perform Grooming: with set-up;with supervision;standing Pt Will Perform Upper Body Bathing: with set-up;with supervision;sitting Pt Will Perform Lower Body Bathing: sit to/from stand;with set-up;with supervision Pt Will Perform Upper Body Dressing: with set-up;with supervision;sitting Pt Will Perform Lower Body Dressing: sit to/from stand;with min assist Pt Will Transfer to Toilet: with  supervision;ambulating;bedside commode Pt Will Perform Toileting - Clothing Manipulation and hygiene: with supervision;sit to/from stand Pt/caregiver will Perform Home Exercise Program: Increased ROM;Left upper extremity;Independently  Plan Discharge plan needs to be updated    Co-evaluation                 End of Session Equipment Utilized During Treatment: Gait belt;Other (comment) (left platform walker)   Activity Tolerance Patient tolerated treatment well   Patient Left in chair;with call bell/phone within reach;with family/visitor present   Nurse Communication Other (comment) (clarify WB status on Lt UE as pt states MD says she can bear weight a some above elbow; pain level)        Time: 7482-7078 OT Time Calculation (min): 31 min  Charges: OT General Charges $OT Visit: 1 Procedure OT Treatments $Self Care/Home Management : 23-37 mins  Benito Mccreedy OTR/L [phone] 12/21/2014, 3:55 PM

## 2014-12-21 NOTE — Progress Notes (Signed)
Patient ID: Hailey Walsh, female   DOB: July 09, 1981, 34 y.o.   MRN: [id number]   LOS: 5 days   Subjective: Doing ok. Ready to go home.   Objective: Vital signs in last 24 hours: Temp:  [97.8 F (36.6 C)-98.8 F (37.1 C)] 98.8 F (37.1 C) (02/23 0500) Pulse Rate:  [84-103] 103 (02/23 0500) Resp:  [16] 16 (02/23 0500) BP: (120-124)/(72-81) 120/72 mmHg (02/23 0500) SpO2:  [96 %-100 %] 100 % (02/23 0500) Last BM Date: 12/20/14   Physical Exam General appearance: alert and no distress Resp: clear to auscultation bilaterally Cardio: regular rate and rhythm GI: normal findings: bowel sounds normal and soft, non-tender Extremities: NVI   Assessment/Plan: Fall Left wrist fx s/p ORIF -- NWB per Dr. Amedeo Plenty Grade 2 splenic lac  Multiple L-spine TVP fxs Right sacral fx s/p SI screw -- NWB to RLE per Dr. Marcelino Scot ABL anemia -- Stable Hx/o DVT -- Xarelto Dispo -- Home today    Lisette Abu, PA-C Pager: [phone] General Trauma PA Pager: [phone]  12/21/2014

## 2014-12-23 ENCOUNTER — Telehealth (HOSPITAL_COMMUNITY): Payer: Self-pay

## 2014-12-23 NOTE — Care Management (Addendum)
Called patient back via interpreter  explained below, voiced understanding stated she has prescription and can go to pharmacy today . Also she stated will go to appointment at [facility] on January 04, 2015 at [place]. Magdalen Spatz RN BSN       Received message , that patient called and was unable to fill Xarelto . With assistance of Lesle Chris ( interpreter ) called patient . Her brother went to [facility] and was told she had used the 30 day free card in past ( one usage per lifetime ) .   Spoke to Dewy Rose at [facility] , she had assisted patient's brother . Asked Caryl Pina why patient could not get 30 days of Xarelto using [place] letter . Per Caryl Pina , patient's brother did not give her  a [place] letter . Caryl Pina has reentered patient into [facility] program and patient / brother can go back today and receive 30 day supply . Brother paid cash for prescriptions on Tuesday , she will refund him money .   Made appointment for patient at [facility] for January 04, 2015 at [place] . Patient has gone there before. They will assist with further Xarelto.    Will call patient back and explain above , with Spero Geralds 's assistance.   Magdalen Spatz RN BSN [phone]

## 2014-12-23 NOTE — Telephone Encounter (Signed)
Hailey Walsh Out patient pharmacist call to notified  Pt not taking Rx Xarelto, pt unable to afford it

## 2014-12-24 NOTE — Telephone Encounter (Signed)
Hailey Walsh was unable to use coupons for Xarelto because she had already used them within the calendar year. She was re-entered into [employer] and will get initial rx through that. She has an appt with PCP at [facility] clinic who will manage her from then on.

## 2014-12-27 ENCOUNTER — Telehealth (HOSPITAL_COMMUNITY): Payer: Self-pay

## 2014-12-27 NOTE — Telephone Encounter (Signed)
Our Spanish interpreter will call patient back and go over follow-up recommendations.

## 2015-01-04 ENCOUNTER — Encounter: Payer: Self-pay | Admitting: Internal Medicine

## 2015-01-04 ENCOUNTER — Ambulatory Visit: Payer: No Typology Code available for payment source | Attending: Internal Medicine | Admitting: Internal Medicine

## 2015-01-04 VITALS — BP 115/79 | HR 73 | Temp 98.4°F | Resp 18 | Ht 61.0 in

## 2015-01-04 DIAGNOSIS — F17210 Nicotine dependence, cigarettes, uncomplicated: Secondary | ICD-10-CM | POA: Insufficient documentation

## 2015-01-04 DIAGNOSIS — W11XXXD Fall on and from ladder, subsequent encounter: Secondary | ICD-10-CM | POA: Insufficient documentation

## 2015-01-04 DIAGNOSIS — S62102S Fracture of unspecified carpal bone, left wrist, sequela: Secondary | ICD-10-CM

## 2015-01-04 DIAGNOSIS — K5900 Constipation, unspecified: Secondary | ICD-10-CM

## 2015-01-04 DIAGNOSIS — S62102D Fracture of unspecified carpal bone, left wrist, subsequent encounter for fracture with routine healing: Secondary | ICD-10-CM | POA: Insufficient documentation

## 2015-01-04 DIAGNOSIS — S3210XD Unspecified fracture of sacrum, subsequent encounter for fracture with routine healing: Secondary | ICD-10-CM | POA: Insufficient documentation

## 2015-01-04 DIAGNOSIS — S3210XS Unspecified fracture of sacrum, sequela: Secondary | ICD-10-CM

## 2015-01-04 DIAGNOSIS — Z79899 Other long term (current) drug therapy: Secondary | ICD-10-CM | POA: Insufficient documentation

## 2015-01-04 DIAGNOSIS — E876 Hypokalemia: Secondary | ICD-10-CM

## 2015-01-04 DIAGNOSIS — R52 Pain, unspecified: Secondary | ICD-10-CM

## 2015-01-04 DIAGNOSIS — Z7901 Long term (current) use of anticoagulants: Secondary | ICD-10-CM | POA: Insufficient documentation

## 2015-01-04 DIAGNOSIS — I82401 Acute embolism and thrombosis of unspecified deep veins of right lower extremity: Secondary | ICD-10-CM

## 2015-01-04 LAB — COMPLETE METABOLIC PANEL WITH GFR
ALBUMIN: 3.9 g/dL (ref 3.5–5.2)
ALK PHOS: 120 U/L — AB (ref 39–117)
ALT: 19 U/L (ref 0–35)
AST: 19 U/L (ref 0–37)
BILIRUBIN TOTAL: 0.4 mg/dL (ref 0.2–1.2)
BUN: 9 mg/dL (ref 6–23)
CHLORIDE: 102 meq/L (ref 96–112)
CO2: 26 mEq/L (ref 19–32)
CREATININE: 0.52 mg/dL (ref 0.50–1.10)
Calcium: 9 mg/dL (ref 8.4–10.5)
GFR, Est Non African American: >89 mL/min
Glucose, Bld: 93 mg/dL (ref 70–99)
Potassium: 4.1 mEq/L (ref 3.5–5.3)
Sodium: 138 mEq/L (ref 135–145)
TOTAL PROTEIN: 6.7 g/dL (ref 6.0–8.3)

## 2015-01-04 MED ORDER — MAGNESIUM HYDROXIDE 400 MG/5ML PO SUSP: 15.0000 mL | Freq: Every day | ORAL | Status: DC | PRN

## 2015-01-04 MED ORDER — GABAPENTIN 300 MG PO CAPS: 300.0000 mg | ORAL_CAPSULE | Freq: Three times a day (TID) | ORAL | Status: DC

## 2015-01-04 NOTE — Progress Notes (Signed)
MRN: [id number] Name: Hailey Walsh  Sex: female Age: 34 y.o. DOB: [birth year]  Allergies: Review of patient's allergies indicates no known allergies.  Chief Complaint  Patient presents with  . Hospitalization Follow-up    HPI: Patient is 34 y.o. female who has  History of DVT, last month she fell 73 ft off of the ladder and was hospitalized had fractures and underwent surgery for trans iliac screw fixation as well as ORIF of left distal radius fracture, underwent PT OT who recommended patient to be discharged and have home health therapies that she couldn't afford, she was continued on Xarelto and was discharged and advised to follow with orthopedic surgery/hand surgery. As per patient she already followed up with and surgeon and has scheduled appointment with orthopedics tomorrow, she still has some pain has been taking Percocet she does complain of constipation.  Past Medical History  Diagnosis Date  . Incompetent cervix 2011    Past Surgical History  Procedure Laterality Date  . Cervical cerclage  10/16/2010  . Abdominal surgery    . Orif wrist fracture Left 12/17/2014    Procedure: OPEN REDUCTION INTERNAL FIXATION (ORIF) WRIST FRACTURE;  Surgeon: Roseanne Kaufman, MD;  Location: [place];  Service: Orthopedics;  Laterality: Left;  . Sacro-iliac pinning Left 12/17/2014    Procedure: Dub Mikes;  Surgeon: Rozanna Box, MD;  Location: [facility];  Service: Orthopedics;  Laterality: Left;      Medication List       This list is accurate as of: 01/04/15  1:14 PM.  Always use your most recent med list.               gabapentin 300 MG capsule  Commonly known as:  NEURONTIN  Take 1 capsule (300 mg total) by mouth 3 (three) times daily.     magnesium hydroxide 400 MG/5ML suspension  Commonly known as:  MILK OF MAGNESIA  Take 15 mLs by mouth daily as needed for mild constipation.     methocarbamol 500 MG tablet  Commonly known as:  ROBAXIN  Take 1-2 tablets  (500-1,000 mg total) by mouth every 6 (six) hours as needed for muscle spasms.     oxyCODONE-acetaminophen 7.5-325 MG per tablet  Commonly known as:  PERCOCET  Take 1-2 tablets by mouth every 4 (four) hours as needed for pain.     PRESCRIPTION MEDICATION  Take 1 tablet by mouth daily. "Birth Control"     rivaroxaban 20 MG Tabs tablet  Commonly known as:  XARELTO  Take 1 tablet (20 mg total) by mouth daily with supper.        Meds ordered this encounter  Medications  . magnesium hydroxide (MILK OF MAGNESIA) 400 MG/5ML suspension    Sig: Take 15 mLs by mouth daily as needed for mild constipation.    Dispense:  360 mL    Refill:  1  . gabapentin (NEURONTIN) 300 MG capsule    Sig: Take 1 capsule (300 mg total) by mouth 3 (three) times daily.    Dispense:  90 capsule    Refill:  3    There is no immunization history for the selected administration types on file for this patient.  No family history on file.  History  Substance Use Topics  . Smoking status: Light Tobacco Smoker    Types: Cigarettes  . Smokeless tobacco: Not on file     Comment: Intermittent smoker-occasional  . Alcohol Use: No    Review of Systems  As noted in HPI  Filed Vitals:   01/04/15 0932  BP: 115/79  Pulse: 73  Temp: 98.4 F (36.9 C)  Resp: 18    Physical Exam  Physical Exam  Constitutional:  Patient is wheelchair not in acute distress  Eyes: EOM are normal. Pupils are equal, round, and reactive to light.  Cardiovascular: Normal rate and regular rhythm.   Pulmonary/Chest: Breath sounds normal. No respiratory distress. She has no wheezes. She has no rales.  Abdominal: Soft. There is no tenderness. There is no rebound.  Musculoskeletal:  Left hand/ forearm in cast    CBC    Component Value Date/Time   WBC 7.0 12/19/2014 0440   RBC 3.50* 12/19/2014 0440   HGB 11.3* 12/19/2014 0440   HCT 33.0* 12/19/2014 0440   PLT 107* 12/19/2014 0440   MCV 94.3 12/19/2014 0440   LYMPHSABS  1.5 09/20/2010 2219   MONOABS 0.4 09/20/2010 2219   EOSABS 0.1 09/20/2010 2219   BASOSABS 0.0 09/20/2010 2219    CMP     Component Value Date/Time   NA 135 12/19/2014 0440   K 3.3* 12/19/2014 0440   CL 104 12/19/2014 0440   CO2 25 12/19/2014 0440   GLUCOSE 105* 12/19/2014 0440   BUN <5* 12/19/2014 0440   CREATININE 0.52 12/19/2014 0440   CALCIUM 7.8* 12/19/2014 0440   PROT 5.4* 12/17/2014 0408   ALBUMIN 3.2* 12/17/2014 0408   AST 55* 12/17/2014 0408   ALT 53* 12/17/2014 0408   ALKPHOS 48 12/17/2014 0408   BILITOT 1.1 12/17/2014 0408   GFRNONAA >90 12/19/2014 0440   GFRAA >90 12/19/2014 0440    No results found for: CHOL  No components found for: HGA1C  Lab Results  Component Value Date/Time   AST 55* 12/17/2014 04:08 AM    Assessment and Plan  DVT (deep venous thrombosis), right Currently patient is on Xarelto  Left wrist fracture, sequela/Sacral fracture, sequela  Patient is following up with a hand surgeon as well as orthopedics. Pain medication when necessary  Constipation, unspecified constipation type - Plan: advised patient for high fiber diet, trial of magnesium hydroxide (MILK OF MAGNESIA) 400 MG/5ML suspension, COMPLETE METABOLIC PANEL WITH GFR  Pain - Plan: gabapentin (NEURONTIN) 300 MG capsule  Hypokalemia - Plan: repeat blood chemistry COMPLETE METABOLIC PANEL WITH GFR    Return in about 3 months (around 04/06/2015), or if symptoms worsen or fail to improve.   This note has been created with Surveyor, quantity. Any transcriptional errors are unintentional.    Lorayne Marek, MD

## 2015-01-04 NOTE — Progress Notes (Signed)
Hospital F/U injury at work on feb 18 Fracture pelvic and lt arm

## 2015-01-10 ENCOUNTER — Telehealth: Payer: Self-pay

## 2015-01-10 NOTE — Telephone Encounter (Signed)
-----   Message from Lorayne Marek, MD sent at 01/05/2015  9:14 AM EST ----- Call and let the patient know that her potassium level is in normal range.

## 2015-01-10 NOTE — Telephone Encounter (Signed)
Interpreter used_[name] Patient is aware of her lab results

## 2015-04-21 ENCOUNTER — Ambulatory Visit: Payer: No Typology Code available for payment source | Admitting: Internal Medicine

## 2015-05-16 ENCOUNTER — Encounter (HOSPITAL_COMMUNITY): Payer: Self-pay | Admitting: *Deleted

## 2015-05-16 MED ORDER — CEFAZOLIN SODIUM-DEXTROSE 2-3 GM-% IV SOLR
2.0000 g | INTRAVENOUS | Status: AC
  Administered 2015-05-17: 2 g via INTRAVENOUS
  Filled 2015-05-16: qty 50

## 2015-05-16 MED ORDER — LACTATED RINGERS IV SOLN
INTRAVENOUS | Status: DC
  Administered 2015-05-17: 10:00:00 via INTRAVENOUS

## 2015-05-16 NOTE — Progress Notes (Signed)
SDW  Call was completed with assist  interpreter , Roselie Awkward, # [phone].

## 2015-05-16 NOTE — H&P (Signed)
Orthopaedic Trauma [facility] H&P  Chief Complaint: symptomatic HW R SI joint  HPI: 34 y/o female presents today for [facility] removal from R SI joint. Pt with persistent pain and difficulty laying on that side   Past Medical History  Diagnosis Date  . Incompetent cervix 2011    Past Surgical History  Procedure Laterality Date  . Cervical cerclage  10/16/2010  . Abdominal surgery    . Orif wrist fracture Left 12/17/2014    Procedure: OPEN REDUCTION INTERNAL FIXATION (ORIF) WRIST FRACTURE;  Surgeon: Roseanne Kaufman, MD;  Location: [facility];  Service: Orthopedics;  Laterality: Left;  . Sacro-iliac pinning Left 12/17/2014    Procedure: Dub Mikes;  Surgeon: Rozanna Box, MD;  Location: [facility];  Service: Orthopedics;  Laterality: Left;  please note that laterality is incorrect on OR log. Unable to change in the computer. It was the R SI joint that was primary injury  No family history on file. Social History:  reports that she has been smoking Cigarettes.  She does not have any smokeless tobacco history on file. She reports that she does not drink alcohol or use illicit drugs.  Allergies: No Known Allergies  No prescriptions prior to admission    Labs   Pending  Review of Systems  Constitutional: Negative for fever and chills.  Respiratory: Negative for shortness of breath and wheezing.   Cardiovascular: Negative for chest pain and palpitations.  Gastrointestinal: Negative for nausea, vomiting and abdominal pain.  Musculoskeletal: Positive for back pain.  Neurological: Negative for tingling, sensory change and headaches.    There were no vitals taken for this visit. Physical Exam  Constitutional: She is oriented to person, place, and time. She appears well-developed and well-nourished. No distress.  HENT:  Head: Normocephalic and atraumatic.  Eyes: EOM are normal. Pupils are equal, round, and reactive to light.  Cardiovascular: Normal rate and regular rhythm.   Respiratory:  Effort normal and breath sounds normal. No respiratory distress. She has no wheezes.  GI: Soft. Bowel sounds are normal. She exhibits no distension. There is no tenderness.  Musculoskeletal:  Pelvis/Lower Extremities    Ambulates w/o limp    Improved strength R leg    Motor and sensory functions intact distally     + peripheral pulses      Neurological: She is alert and oriented to person, place, and time.  Skin: Skin is warm and dry. No erythema.  Psychiatric: She has a normal mood and affect. Her behavior is normal.     Assessment/Plan  34 y/o female s/p trans-sacral screw fixation for vertically unstable pelvic ring fx with symptomatic HW  OR for [facility] Out[facility] procedure Risks and benefits reviewed with pt. She wishes to proceed   Jari Pigg, PA-C [facility] [phone] (P) 05/16/2015, 1:03 PM

## 2015-05-17 ENCOUNTER — Ambulatory Visit (HOSPITAL_COMMUNITY): Payer: Self-pay | Admitting: Anesthesiology

## 2015-05-17 ENCOUNTER — Encounter (HOSPITAL_COMMUNITY)
Admission: RE | Disposition: A | Discharge status: Home / Self Care | Payer: Self-pay | Source: Ambulatory Visit | Attending: Orthopedic Surgery

## 2015-05-17 ENCOUNTER — Ambulatory Visit (HOSPITAL_COMMUNITY)
Admission: RE | Admit: 2015-05-17 | Discharge: 2015-05-17 | Disposition: A | Discharge status: Home / Self Care | Payer: Self-pay | Source: Ambulatory Visit | Attending: Orthopedic Surgery | Admitting: Orthopedic Surgery

## 2015-05-17 ENCOUNTER — Encounter (HOSPITAL_COMMUNITY): Payer: Self-pay | Admitting: Anesthesiology

## 2015-05-17 ENCOUNTER — Ambulatory Visit (HOSPITAL_COMMUNITY): Payer: No Typology Code available for payment source

## 2015-05-17 ENCOUNTER — Ambulatory Visit: Payer: No Typology Code available for payment source | Admitting: Internal Medicine

## 2015-05-17 DIAGNOSIS — Z419 Encounter for procedure for purposes other than remedying health state, unspecified: Secondary | ICD-10-CM

## 2015-05-17 DIAGNOSIS — T8489XA Other specified complication of internal orthopedic prosthetic devices, implants and grafts, initial encounter: Secondary | ICD-10-CM | POA: Insufficient documentation

## 2015-05-17 DIAGNOSIS — F17210 Nicotine dependence, cigarettes, uncomplicated: Secondary | ICD-10-CM | POA: Insufficient documentation

## 2015-05-17 HISTORY — DX: Anxiety disorder, unspecified: F41.9

## 2015-05-17 HISTORY — PX: HARDWARE REMOVAL: SHX979

## 2015-05-17 HISTORY — DX: Acute embolism and thrombosis of unspecified deep veins of unspecified lower extremity: I82.409

## 2015-05-17 LAB — BASIC METABOLIC PANEL
Anion gap: 5 (ref 5–15)
BUN: 10 mg/dL (ref 6–20)
CALCIUM: 8.4 mg/dL — AB (ref 8.9–10.3)
CO2: 24 mmol/L (ref 22–32)
Chloride: 108 mmol/L (ref 101–111)
Creatinine, Ser: 0.64 mg/dL (ref 0.44–1.00)
GFR calc Af Amer: >60 mL/min (ref >60)
GFR calc non Af Amer: >60 mL/min (ref >60)
Glucose, Bld: 107 mg/dL — ABNORMAL HIGH (ref 65–99)
Potassium: 4.3 mmol/L (ref 3.5–5.1)
Sodium: 137 mmol/L (ref 135–145)

## 2015-05-17 LAB — CBC
HEMATOCRIT: 36.3 % (ref 36.0–46.0)
HEMOGLOBIN: 12.8 g/dL (ref 12.0–15.0)
MCH: 32.2 pg (ref 26.0–34.0)
MCHC: 35.3 g/dL (ref 30.0–36.0)
MCV: 91.4 fL (ref 78.0–100.0)
Platelets: 191 10*3/uL (ref 150–400)
RBC: 3.97 MIL/uL (ref 3.87–5.11)
RDW: 12.3 % (ref 11.5–15.5)
WBC: 6.2 10*3/uL (ref 4.0–10.5)

## 2015-05-17 LAB — HCG, SERUM, QUALITATIVE: Preg, Serum: POSITIVE — AB

## 2015-05-17 SURGERY — REMOVAL, HARDWARE
Anesthesia: General | Site: Hip | Laterality: Right

## 2015-05-17 MED ORDER — GLYCOPYRROLATE 0.2 MG/ML IJ SOLN
INTRAMUSCULAR | Status: AC
  Filled 2015-05-17: qty 3

## 2015-05-17 MED ORDER — CHLORHEXIDINE GLUCONATE 4 % EX LIQD: 60.0000 mL | Freq: Once | CUTANEOUS | Status: DC

## 2015-05-17 MED ORDER — BUPIVACAINE LIPOSOME 1.3 % IJ SUSP
INTRAMUSCULAR | Status: DC | PRN
  Administered 2015-05-17: 20 mL

## 2015-05-17 MED ORDER — ONDANSETRON HCL 4 MG/2ML IJ SOLN: 4.0000 mg | Freq: Once | INTRAMUSCULAR | Status: DC | PRN

## 2015-05-17 MED ORDER — 0.9 % SODIUM CHLORIDE (POUR BTL) OPTIME
TOPICAL | Status: DC | PRN
  Administered 2015-05-17: 1000 mL

## 2015-05-17 MED ORDER — BUPIVACAINE LIPOSOME 1.3 % IJ SUSP
20.0000 mL | INTRAMUSCULAR | Status: DC
  Filled 2015-05-17: qty 20

## 2015-05-17 MED ORDER — DEXAMETHASONE SODIUM PHOSPHATE 4 MG/ML IJ SOLN
INTRAMUSCULAR | Status: AC
Start: 2015-05-17 — End: 2015-05-17
  Filled 2015-05-17: qty 2

## 2015-05-17 MED ORDER — DEXTROSE 5 % IV SOLN
INTRAVENOUS | Status: DC | PRN
  Administered 2015-05-17: 12:00:00 via INTRAVENOUS

## 2015-05-17 MED ORDER — KETOROLAC TROMETHAMINE 30 MG/ML IJ SOLN
INTRAMUSCULAR | Status: AC
  Filled 2015-05-17: qty 1

## 2015-05-17 MED ORDER — FENTANYL CITRATE (PF) 100 MCG/2ML IJ SOLN
INTRAMUSCULAR | Status: DC | PRN
  Administered 2015-05-17: 50 ug via INTRAVENOUS
  Administered 2015-05-17: 100 ug via INTRAVENOUS

## 2015-05-17 MED ORDER — NEOSTIGMINE METHYLSULFATE 10 MG/10ML IV SOLN
INTRAVENOUS | Status: DC | PRN
  Administered 2015-05-17: 4 mg via INTRAVENOUS

## 2015-05-17 MED ORDER — LIDOCAINE HCL (CARDIAC) 20 MG/ML IV SOLN
INTRAVENOUS | Status: DC | PRN
  Administered 2015-05-17: 40 mg via INTRAVENOUS

## 2015-05-17 MED ORDER — MIDAZOLAM HCL 5 MG/5ML IJ SOLN
INTRAMUSCULAR | Status: DC | PRN
  Administered 2015-05-17: 1 mg via INTRAVENOUS

## 2015-05-17 MED ORDER — KETOROLAC TROMETHAMINE 10 MG PO TABS: 10.0000 mg | ORAL_TABLET | Freq: Four times a day (QID) | ORAL | Status: DC | PRN

## 2015-05-17 MED ORDER — MIDAZOLAM HCL 2 MG/2ML IJ SOLN
INTRAMUSCULAR | Status: AC
  Filled 2015-05-17: qty 2

## 2015-05-17 MED ORDER — ARTIFICIAL TEARS OP OINT
TOPICAL_OINTMENT | OPHTHALMIC | Status: DC | PRN
  Administered 2015-05-17: 1 via OPHTHALMIC

## 2015-05-17 MED ORDER — ONDANSETRON HCL 4 MG/2ML IJ SOLN
INTRAMUSCULAR | Status: DC | PRN
  Administered 2015-05-17: 4 mg via INTRAVENOUS

## 2015-05-17 MED ORDER — OXYCODONE HCL 5 MG/5ML PO SOLN: 5.0000 mg | Freq: Once | ORAL | Status: DC | PRN

## 2015-05-17 MED ORDER — FENTANYL CITRATE (PF) 100 MCG/2ML IJ SOLN: 25.0000 ug | INTRAMUSCULAR | Status: DC | PRN

## 2015-05-17 MED ORDER — PROPOFOL 10 MG/ML IV BOLUS
INTRAVENOUS | Status: DC | PRN
  Administered 2015-05-17: 170 mg via INTRAVENOUS

## 2015-05-17 MED ORDER — ROCURONIUM BROMIDE 100 MG/10ML IV SOLN
INTRAVENOUS | Status: DC | PRN
  Administered 2015-05-17: 50 mg via INTRAVENOUS

## 2015-05-17 MED ORDER — ROCURONIUM BROMIDE 50 MG/5ML IV SOLN
INTRAVENOUS | Status: AC
  Filled 2015-05-17: qty 1

## 2015-05-17 MED ORDER — ARTIFICIAL TEARS OP OINT
TOPICAL_OINTMENT | OPHTHALMIC | Status: AC
  Filled 2015-05-17: qty 3.5

## 2015-05-17 MED ORDER — LIDOCAINE HCL (CARDIAC) 20 MG/ML IV SOLN
INTRAVENOUS | Status: AC
  Filled 2015-05-17: qty 5

## 2015-05-17 MED ORDER — OXYCODONE-ACETAMINOPHEN 5-325 MG PO TABS: 1.0000 | ORAL_TABLET | Freq: Four times a day (QID) | ORAL | Status: DC | PRN

## 2015-05-17 MED ORDER — GLYCOPYRROLATE 0.2 MG/ML IJ SOLN
INTRAMUSCULAR | Status: DC | PRN
  Administered 2015-05-17: 0.6 mg via INTRAVENOUS

## 2015-05-17 MED ORDER — ONDANSETRON HCL 4 MG/2ML IJ SOLN
INTRAMUSCULAR | Status: AC
  Filled 2015-05-17: qty 2

## 2015-05-17 MED ORDER — DEXAMETHASONE SODIUM PHOSPHATE 4 MG/ML IJ SOLN
INTRAMUSCULAR | Status: DC | PRN
  Administered 2015-05-17: 8 mg via INTRAVENOUS

## 2015-05-17 MED ORDER — OXYCODONE HCL 5 MG PO TABS: 5.0000 mg | ORAL_TABLET | Freq: Once | ORAL | Status: DC | PRN

## 2015-05-17 MED ORDER — FENTANYL CITRATE (PF) 250 MCG/5ML IJ SOLN
INTRAMUSCULAR | Status: AC
  Filled 2015-05-17: qty 5

## 2015-05-17 MED ORDER — KETOROLAC TROMETHAMINE 30 MG/ML IJ SOLN
INTRAMUSCULAR | Status: DC | PRN
Start: 2015-05-17 — End: 2015-05-17
  Administered 2015-05-17: 30 mg via INTRAVENOUS

## 2015-05-17 MED ORDER — PROPOFOL 10 MG/ML IV BOLUS
INTRAVENOUS | Status: AC
  Filled 2015-05-17: qty 20

## 2015-05-17 SURGICAL SUPPLY — 68 items
BANDAGE ELASTIC 4 VELCRO ST LF (GAUZE/BANDAGES/DRESSINGS) IMPLANT
BANDAGE ELASTIC 6 VELCRO ST LF (GAUZE/BANDAGES/DRESSINGS) IMPLANT
BANDAGE ESMARK 6X9 LF (GAUZE/BANDAGES/DRESSINGS) IMPLANT
BNDG COHESIVE 6X5 TAN STRL LF (GAUZE/BANDAGES/DRESSINGS) IMPLANT
BNDG ESMARK 6X9 LF (GAUZE/BANDAGES/DRESSINGS)
BNDG GAUZE ELAST 4 BULKY (GAUZE/BANDAGES/DRESSINGS) IMPLANT
BRUSH SCRUB DISP (MISCELLANEOUS) ×6 IMPLANT
CANISTER SUCTION WELLS/JOHNSON (MISCELLANEOUS) ×3 IMPLANT
CLEANER TIP ELECTROSURG 2X2 (MISCELLANEOUS) IMPLANT
CLOSURE WOUND 1/2 X4 (GAUZE/BANDAGES/DRESSINGS)
COVER SURGICAL LIGHT HANDLE (MISCELLANEOUS) ×3 IMPLANT
CUFF TOURNIQUET SINGLE 18IN (TOURNIQUET CUFF) IMPLANT
CUFF TOURNIQUET SINGLE 24IN (TOURNIQUET CUFF) IMPLANT
CUFF TOURNIQUET SINGLE 34IN LL (TOURNIQUET CUFF) IMPLANT
DRAPE C-ARM 42X72 X-RAY (DRAPES) IMPLANT
DRAPE C-ARMOR (DRAPES) IMPLANT
DRAPE LAPAROTOMY T 102X78X121 (DRAPES) ×3 IMPLANT
DRAPE OEC MINIVIEW 54X84 (DRAPES) IMPLANT
DRAPE ORTHO SPLIT 77X108 STRL (DRAPES) ×2
DRAPE SURG ORHT 6 SPLT 77X108 (DRAPES) ×1 IMPLANT
DRAPE U-SHAPE 47X51 STRL (DRAPES) IMPLANT
DRSG ADAPTIC 3X8 NADH LF (GAUZE/BANDAGES/DRESSINGS) IMPLANT
DRSG MEPILEX BORDER 4X4 (GAUZE/BANDAGES/DRESSINGS) ×3 IMPLANT
ELECT REM PT RETURN 9FT ADLT (ELECTROSURGICAL) ×3
ELECTRODE REM PT RTRN 9FT ADLT (ELECTROSURGICAL) ×1 IMPLANT
EVACUATOR 1/8 PVC DRAIN (DRAIN) IMPLANT
GAUZE SPONGE 4X4 12PLY STRL (GAUZE/BANDAGES/DRESSINGS) IMPLANT
GLOVE BIO SURGEON STRL SZ7.5 (GLOVE) IMPLANT
GLOVE BIO SURGEON STRL SZ8 (GLOVE) ×3 IMPLANT
GLOVE BIOGEL PI IND STRL 6.5 (GLOVE) ×1 IMPLANT
GLOVE BIOGEL PI IND STRL 7.5 (GLOVE) IMPLANT
GLOVE BIOGEL PI IND STRL 8 (GLOVE) ×1 IMPLANT
GLOVE BIOGEL PI INDICATOR 6.5 (GLOVE) ×2
GLOVE BIOGEL PI INDICATOR 7.5 (GLOVE)
GLOVE BIOGEL PI INDICATOR 8 (GLOVE) ×2
GLOVE ECLIPSE 6.5 STRL STRAW (GLOVE) ×3 IMPLANT
GOWN STRL REUS W/ TWL LRG LVL3 (GOWN DISPOSABLE) ×2 IMPLANT
GOWN STRL REUS W/ TWL XL LVL3 (GOWN DISPOSABLE) ×1 IMPLANT
GOWN STRL REUS W/TWL LRG LVL3 (GOWN DISPOSABLE) ×4
GOWN STRL REUS W/TWL XL LVL3 (GOWN DISPOSABLE) ×2
KIT BASIN OR (CUSTOM PROCEDURE TRAY) ×3 IMPLANT
KIT ROOM TURNOVER OR (KITS) ×3 IMPLANT
MANIFOLD NEPTUNE II (INSTRUMENTS) IMPLANT
NEEDLE 22X1 1/2 (OR ONLY) (NEEDLE) ×3 IMPLANT
NS IRRIG 1000ML POUR BTL (IV SOLUTION) ×3 IMPLANT
PACK ORTHO EXTREMITY (CUSTOM PROCEDURE TRAY) ×3 IMPLANT
PAD ARMBOARD 7.5X6 YLW CONV (MISCELLANEOUS) ×6 IMPLANT
PADDING CAST COTTON 6X4 STRL (CAST SUPPLIES) IMPLANT
SPONGE LAP 18X18 X RAY DECT (DISPOSABLE) IMPLANT
SPONGE SCRUB IODOPHOR (GAUZE/BANDAGES/DRESSINGS) ×3 IMPLANT
STAPLER VISISTAT 35W (STAPLE) ×3 IMPLANT
STOCKINETTE IMPERVIOUS LG (DRAPES) IMPLANT
STRIP CLOSURE SKIN 1/2X4 (GAUZE/BANDAGES/DRESSINGS) IMPLANT
SUCTION FRAZIER TIP 10 FR DISP (SUCTIONS) IMPLANT
SUT ETHILON 3 0 PS 1 (SUTURE) ×3 IMPLANT
SUT PDS AB 2-0 CT1 27 (SUTURE) IMPLANT
SUT VIC AB 0 CT1 27 (SUTURE) ×2
SUT VIC AB 0 CT1 27XBRD ANBCTR (SUTURE) ×1 IMPLANT
SUT VIC AB 2-0 CT1 27 (SUTURE) ×2
SUT VIC AB 2-0 CT1 TAPERPNT 27 (SUTURE) ×1 IMPLANT
SYR CONTROL 10ML LL (SYRINGE) ×3 IMPLANT
TOWEL OR 17X24 6PK STRL BLUE (TOWEL DISPOSABLE) ×6 IMPLANT
TOWEL OR 17X26 10 PK STRL BLUE (TOWEL DISPOSABLE) ×6 IMPLANT
TUBE CONNECTING 12'X1/4 (SUCTIONS) ×1
TUBE CONNECTING 12X1/4 (SUCTIONS) ×2 IMPLANT
UNDERPAD 30X30 INCONTINENT (UNDERPADS AND DIAPERS) IMPLANT
WATER STERILE IRR 1000ML POUR (IV SOLUTION) IMPLANT
YANKAUER SUCT BULB TIP NO VENT (SUCTIONS) ×3 IMPLANT

## 2015-05-17 NOTE — Anesthesia Postprocedure Evaluation (Signed)
  Anesthesia Post-op Note  Patient: Hailey Walsh  Procedure(s) Performed: Procedure(s): HARDWARE REMOVAL RIGHT ILIAC SCREW (Right)  Patient Location: PACU  Anesthesia Type:General  Level of Consciousness: awake, alert  and oriented  Airway and Oxygen Therapy: Patient Spontanous Breathing and Patient connected to nasal cannula oxygen  Post-op Pain: mild  Post-op Assessment: Post-op Vital signs reviewed, Patient's Cardiovascular Status Stable, Respiratory Function Stable, Patent Airway and Pain level controlled              Post-op Vital Signs: stable  Last Vitals:  Filed Vitals:   05/17/15 1430  BP:   Pulse:   Temp: 36.2 C  Resp:     Complications: No apparent anesthesia complications

## 2015-05-17 NOTE — Anesthesia Preprocedure Evaluation (Signed)
Anesthesia Evaluation  Patient identified by MRN, date of birth, ID band Patient awake    Reviewed: Allergy & Precautions, NPO status , Patient's Chart, lab work & pertinent test results  Airway Mallampati: II  TM Distance: >3 FB Neck ROM: Full    Dental  (+) Teeth Intact, Dental Advisory Given   Pulmonary Current Smoker,  breath sounds clear to auscultation        Cardiovascular Rhythm:Regular Rate:Normal     Neuro/Psych    GI/Hepatic   Endo/Other    Renal/GU      Musculoskeletal   Abdominal   Peds  Hematology   Anesthesia Other Findings   Reproductive/Obstetrics                             Anesthesia Physical Anesthesia Plan  ASA: II  Anesthesia Plan: General   Post-op Pain Management:    Induction: Intravenous  Airway Management Planned: Oral ETT  Additional Equipment:   Intra-op Plan:   Post-operative Plan: Extubation in OR  Informed Consent: I have reviewed the patients History and Physical, chart, labs and discussed the procedure including the risks, benefits and alternatives for the proposed anesthesia with the patient or authorized representative who has indicated his/her understanding and acceptance.   Dental advisory given  Plan Discussed with: CRNA and Anesthesiologist  Anesthesia Plan Comments:         Anesthesia Quick Evaluation

## 2015-05-17 NOTE — Transfer of Care (Signed)
Immediate Anesthesia Transfer of Care Note  Patient: Hailey Walsh  Procedure(s) Performed: Procedure(s): HARDWARE REMOVAL RIGHT ILIAC SCREW (Right)  Patient Location: PACU  Anesthesia Type:General  Level of Consciousness: awake, oriented, patient cooperative and responds to stimulation  Airway & Oxygen Therapy: Patient Spontanous Breathing and Patient connected to nasal cannula oxygen  Post-op Assessment: Report given to RN and Post -op Vital signs reviewed and stable  Post vital signs: Reviewed and stable  Last Vitals:  Filed Vitals:   05/17/15 0902  BP: 136/90  Pulse: 58  Temp: 36.6 C  Resp: 20    Complications: No apparent anesthesia complications

## 2015-05-17 NOTE — Anesthesia Procedure Notes (Signed)
Procedure Name: Intubation Performed by: Terrill Mohr Pre-anesthesia Checklist: Patient identified, Emergency Drugs available, Suction available and Patient being monitored Patient Re-evaluated:Patient Re-evaluated prior to inductionOxygen Delivery Method: Circle system utilized Preoxygenation: Pre-oxygenation with 100% oxygen Intubation Type: IV induction Ventilation: Mask ventilation without difficulty Laryngoscope Size: Mac and 3 Grade View: Grade I Tube type: Oral Tube size: 7.5 mm Number of attempts: 1 Airway Equipment and Method: Stylet Placement Confirmation: ETT inserted through vocal cords under direct vision,  positive ETCO2 and breath sounds checked- equal and bilateral Secured at: 21 (cm at teeth) cm Tube secured with: Tape Dental Injury: Teeth and Oropharynx as per pre-operative assessment

## 2015-05-17 NOTE — Discharge Instructions (Signed)
[facility] Discharge Instructions   General Discharge Instructions  WEIGHT BEARING STATUS: Weightbearing as tolerated  RANGE OF MOTION/ACTIVITY: as tolerated  PAIN MEDICATION USE AND EXPECTATIONS  You have likely been given narcotic medications to help control your pain.  After a traumatic event that results in an fracture (broken bone) with or without surgery, it is ok to use narcotic pain medications to help control one's pain.  We understand that everyone responds to pain differently and each individual patient will be evaluated on a regular basis for the continued need for narcotic medications. Ideally, narcotic medication use should last no more than 6-8 weeks (coinciding with fracture healing).   As a patient it is your responsibility as well to monitor narcotic medication use and report the amount and frequency you use these medications when you come to your office visit.   We would also advise that if you are using narcotic medications, you should take a dose prior to therapy to maximize you participation.  IF YOU ARE ON NARCOTIC MEDICATIONS IT IS NOT PERMISSIBLE TO OPERATE A MOTOR VEHICLE (MOTORCYCLE/CAR/TRUCK/MOPED) OR HEAVY MACHINERY DO NOT MIX NARCOTICS WITH OTHER CNS (CENTRAL NERVOUS SYSTEM) DEPRESSANTS SUCH AS ALCOHOL  Diet: as you were eating previously.  Can use over the counter stool softeners and bowel preparations, such as Miralax, to help with bowel movements.  Narcotics can be constipating.  Be sure to drink plenty of fluids  Wound Care: daily dressing changes starting 05/19/2015  STOP SMOKING OR USING NICOTINE PRODUCTS!!!!  As discussed nicotine severely impairs your body's ability to heal surgical and traumatic wounds but also impairs bone healing.  Wounds and bone heal by forming microscopic blood vessels (angiogenesis) and nicotine is a vasoconstrictor (essentially, shrinks blood vessels).  Therefore, if vasoconstriction occurs to these microscopic blood  vessels they essentially disappear and are unable to deliver necessary nutrients to the healing tissue.  This is one modifiable factor that you can do to dramatically increase your chances of healing your injury.    (This means no smoking, no nicotine gum, patches, etc)  DO NOT USE NONSTEROIDAL ANTI-INFLAMMATORY DRUGS (NSAID'S)  Using products such as Advil (ibuprofen), Aleve (naproxen), Motrin (ibuprofen) for additional pain control during fracture healing can delay and/or prevent the healing response.  If you would like to take over the counter (OTC) medication, Tylenol (acetaminophen) is ok.  However, some narcotic medications that are given for pain control contain acetaminophen as well. Therefore, you should not exceed more than 4000 mg of tylenol in a day if you do not have liver disease.  Also note that there are may OTC medicines, such as cold medicines and allergy medicines that my contain tylenol as well.  If you have any questions about medications and/or interactions please ask your doctor/PA or your pharmacist.      ICE AND ELEVATE INJURED/OPERATIVE EXTREMITY  Using ice and elevating the injured extremity above your heart can help with swelling and pain control.  Icing in a pulsatile fashion, such as 20 minutes on and 20 minutes off, can be followed.    Do not place ice directly on skin. Make sure there is a barrier between to skin and the ice pack.    Using frozen items such as frozen peas works well as the conform nicely to the are that needs to be iced.  USE AN ACE WRAP OR TED HOSE FOR SWELLING CONTROL  In addition to icing and elevation, Ace wraps or TED hose are used to help limit  and resolve swelling.  It is recommended to use Ace wraps or TED hose until you are informed to stop.    When using Ace Wraps start the wrapping distally (farthest away from the body) and wrap proximally (closer to the body)   Example: If you had surgery on your leg or thing and you do not have a splint on,  start the ace wrap at the toes and work your way up to the thigh        If you had surgery on your upper extremity and do not have a splint on, start the ace wrap at your fingers and work your way up to the upper arm  IF YOU ARE IN A SPLINT OR CAST DO NOT [place]   If your splint gets wet for any reason please contact the office immediately. You may shower in your splint or cast as long as you keep it dry.  This can be done by wrapping in a cast cover or garbage back (or similar)  Do Not stick any thing down your splint or cast such as pencils, money, or hangers to try and scratch yourself with.  If you feel itchy take benadryl as prescribed on the bottle for itching  IF YOU ARE IN A CAM BOOT (BLACK BOOT)  You may remove boot periodically. Perform daily dressing changes as noted below.  Wash the liner of the boot regularly and wear a sock when wearing the boot. It is recommended that you sleep in the boot until told otherwise  CALL THE OFFICE WITH ANY QUESTIONS OR CONCERTS: [phone]     Discharge Pin Site Instructions  Dress pins daily with Kerlix roll starting on POD 2. Wrap the Kerlix so that it tamps the skin down around the pin-skin interface to prevent/limit motion of the skin relative to the pin.  (Pin-skin motion is the primary cause of pain and infection related to external fixator pin sites).  Remove any crust or coagulum that may obstruct drainage with a saline moistened gauze or soap and water.  After POD 3, if there is no discernable drainage on the pin site dressing, the interval for change can by increased to every other day.  You may shower with the fixator, cleaning all pin sites gently with soap and water.  If you have a surgical wound this needs to be completely dry and without drainage before showering.  The extremity can be lifted by the fixator to facilitate wound care and transfers.  Notify the office/Doctor if you experience increasing drainage,  redness, or pain from a pin site, or if you notice purulent (thick, snot-like) drainage.  Discharge Wound Care Instructions  Do NOT apply any ointments, solutions or lotions to pin sites or surgical wounds.  These prevent needed drainage and even though solutions like hydrogen peroxide kill bacteria, they also damage cells lining the pin sites that help fight infection.  Applying lotions or ointments can keep the wounds moist and can cause them to breakdown and open up as well. This can increase the risk for infection. When in doubt call the office.  Surgical incisions should be dressed daily.  If any drainage is noted, use one layer of adaptic, then gauze, Kerlix, and an ace wrap.  Once the incision is completely dry and without drainage, it may be left open to air out.  Showering may begin 36-48 hours later.  Cleaning gently with soap and water.  Traumatic wounds should be dressed daily  as well.    One layer of adaptic, gauze, Kerlix, then ace wrap.  The adaptic can be discontinued once the draining has ceased    If you have a wet to dry dressing: wet the gauze with saline the squeeze as much saline out so the gauze is moist (not soaking wet), place moistened gauze over wound, then place a dry gauze over the moist one, followed by Kerlix wrap, then ace wrap.

## 2015-05-19 ENCOUNTER — Encounter (HOSPITAL_COMMUNITY): Payer: Self-pay | Admitting: Orthopedic Surgery

## 2015-05-19 NOTE — Op Note (Signed)
NAME:  Hailey Walsh, Hailey Walsh NO.:  [id number]  MEDICAL RECORD NO.:  [id number]  LOCATION:  [facility]                         FACILITY:  [facility]  PHYSICIAN:  Astrid Divine. Marcelino Scot, M.D. DATE OF BIRTH:  1981/10/15  DATE OF PROCEDURE:  05/17/2015 DATE OF DISCHARGE:  05/17/2015                              OPERATIVE REPORT   PREOPERATIVE DIAGNOSIS:  Symptomatic hardware, pelvis.  POSTOPERATIVE DIAGNOSIS:  Symptomatic hardware, pelvis.  PROCEDURE:  Removal of deep implant transsacral screw.  SURGEON:  Astrid Divine. Marcelino Scot, M.D.  ASSISTANT:  None.  ANESTHESIA:  General.  COMPLICATIONS:  None.  BRIEF SUMMARY AND INDICATION FOR PROCEDURE:  The patient is a 34 year old female who had an unstable pelvic ring, treated with transsacral screw fixation.  She went on to unite, but has had persisting complaints of pain on the left side where the transsacral screw terminated.  These symptoms have been consistent and not responsive to conservative measures.  She requests removal.  I counseled her regarding the potential complications including the inability to remove the washer, nerve injury, vessel injury, infection, DVT, PE, other nerve or vessel injury, and occult instability and loss of reduction as well.  The patient acknowledged these risks through translator and did wish to proceed.  BRIEF SUMMARY OF PROCEDURE:  The patient received Ancef preoperatively. She was taken to the operating room where general anesthesia was induced.  The right hip area was prepped and draped in usual sterile fashion.  The old incision was remade and the subcutaneous tissue spread with a tonsil.  Pin for the cannulated screw was then inserted down to the iliac wing where the screw was located and the pin advanced to the cannulated portion of the screw.  I then advanced the screwdriver over this and engaged the screw and withdrew it without complication.  I did attempt to place a tonsil over the pin and  engaged the washer and pinned it against the screw and then we withdrew it.  I did attempt this several times using fluoro for assistance but was unable to mobilize the washer in order to reduce the chance of injury to see if it was completely stable and should not provoke any symptoms and had been previously discussed with the patient is an item that we did not anticipate being able to remove without making a large incision.  The wound was irrigated thoroughly and then closed with 3-0 nylon.  A Mepilex dressing was applied.  The patient was awakened from anesthesia and transported to PACU in stable condition.  PROGNOSIS:  She will be weightbearing as tolerated with no formal restrictions.  I will plan to see her back in the office in 10-14 days for removal of sutures.  I did place Exparel for additional pain relief and control.  She had Percocet for pain control for the next several days as well.     Astrid Divine. Marcelino Scot, M.D.     MHH/MEDQ  D:  05/18/2015  T:  05/19/2015  Job:  [id number]

## 2015-05-24 ENCOUNTER — Ambulatory Visit: Payer: No Typology Code available for payment source | Attending: Internal Medicine | Admitting: Internal Medicine

## 2015-05-24 ENCOUNTER — Encounter: Payer: Self-pay | Admitting: Internal Medicine

## 2015-05-24 ENCOUNTER — Telehealth: Payer: Self-pay

## 2015-05-24 ENCOUNTER — Telehealth: Payer: Self-pay | Admitting: Internal Medicine

## 2015-05-24 VITALS — BP 112/78 | HR 82 | Temp 98.0°F | Resp 16 | Wt 165.0 lb

## 2015-05-24 DIAGNOSIS — M62830 Muscle spasm of back: Secondary | ICD-10-CM

## 2015-05-24 DIAGNOSIS — R110 Nausea: Secondary | ICD-10-CM | POA: Insufficient documentation

## 2015-05-24 DIAGNOSIS — F17210 Nicotine dependence, cigarettes, uncomplicated: Secondary | ICD-10-CM | POA: Insufficient documentation

## 2015-05-24 DIAGNOSIS — Z79899 Other long term (current) drug therapy: Secondary | ICD-10-CM | POA: Insufficient documentation

## 2015-05-24 LAB — POCT URINALYSIS DIPSTICK
Bilirubin, UA: NEGATIVE
Glucose, UA: NEGATIVE
Ketones, UA: NEGATIVE
Leukocytes, UA: NEGATIVE
NITRITE UA: NEGATIVE
Protein, UA: NEGATIVE
RBC UA: NEGATIVE
Spec Grav, UA: 1.015
Urobilinogen, UA: 0.2
pH, UA: 6.5

## 2015-05-24 LAB — COMPLETE METABOLIC PANEL WITH GFR
ALT: 15 U/L (ref 6–29)
AST: 17 U/L (ref 10–30)
Albumin: 4 g/dL (ref 3.6–5.1)
Alkaline Phosphatase: 81 U/L (ref 33–115)
BILIRUBIN TOTAL: 0.4 mg/dL (ref 0.2–1.2)
BUN: 12 mg/dL (ref 7–25)
CO2: 26 meq/L (ref 20–31)
Calcium: 8.8 mg/dL (ref 8.6–10.2)
Chloride: 104 mEq/L (ref 98–110)
Creat: 0.62 mg/dL (ref 0.50–1.10)
GFR, Est African American: >89 mL/min (ref >=60)
GFR, Est Non African American: >89 mL/min (ref >=60)
Glucose, Bld: 82 mg/dL (ref 65–99)
Potassium: 4.2 mEq/L (ref 3.5–5.3)
SODIUM: 141 meq/L (ref 135–146)
Total Protein: 6.6 g/dL (ref 6.1–8.1)

## 2015-05-24 LAB — POCT URINE PREGNANCY: Preg Test, Ur: NEGATIVE

## 2015-05-24 MED ORDER — CYCLOBENZAPRINE HCL 10 MG PO TABS: 10.0000 mg | ORAL_TABLET | Freq: Three times a day (TID) | ORAL | Status: DC | PRN

## 2015-05-24 MED ORDER — METOCLOPRAMIDE HCL 10 MG PO TABS: 10.0000 mg | ORAL_TABLET | Freq: Three times a day (TID) | ORAL | Status: DC | PRN

## 2015-05-24 NOTE — Telephone Encounter (Signed)
Patient forgot to mention if she is to take calcium, will she require rx or buy otc.Please f/u

## 2015-05-24 NOTE — Progress Notes (Signed)
Patient here for follow up Complains of lower left sided back pain  Patient also complains of feeling nausea but did states Has been having regular periods Currently not on birth control

## 2015-05-24 NOTE — Telephone Encounter (Signed)
With interpreter spoke with patient here in office before she left

## 2015-05-24 NOTE — Progress Notes (Signed)
MRN: [id number] Name: Hailey Walsh  Sex: female Age: 34 y.o. DOB: Dec 18, 1980  Allergies: Review of patient's allergies indicates no known allergies.  Chief Complaint  Patient presents with  . Back Pain    HPI: Patient is 34 y.o. female who has history of pelvic fracture, one week ago she  hardware removal, currently patient is taking pain medication, she reported to have some nausea and vomiting recently, she denies any urinary symptoms, previous blood work reviewed her serum beta-hCG test was positive, today her urine pregnancy test is negative as per patient her last menstrual period was one week ago, denies any fever chills.  Past Medical History  Diagnosis Date  . Incompetent cervix 2011  . Anxiety   . DVT (deep venous thrombosis) 11/2014    Past Surgical History  Procedure Laterality Date  . Cervical cerclage  10/16/2010  . Abdominal surgery    . Orif wrist fracture Left 12/17/2014    Procedure: OPEN REDUCTION INTERNAL FIXATION (ORIF) WRIST FRACTURE;  Surgeon: Roseanne Kaufman, MD;  Location: [facility];  Service: Orthopedics;  Laterality: Left;  . Sacro-iliac pinning Left 12/17/2014    Procedure: [place] Mikes;  Surgeon: Rozanna Box, MD;  Location: [facility];  Service: Orthopedics;  Laterality: Left;  . Hardware removal Right 05/17/2015    Procedure: HARDWARE REMOVAL RIGHT ILIAC SCREW;  Surgeon: Altamese [place], MD;  Location: [facility];  Service: Orthopedics;  Laterality: Right;      Medication List       This list is accurate as of: 05/24/15 12:56 PM.  Always use your most recent med list.               cyclobenzaprine 10 MG tablet  Commonly known as:  FLEXERIL  Take 1 tablet (10 mg total) by mouth 3 (three) times daily as needed for muscle spasms.     ketorolac 10 MG tablet  Commonly known as:  TORADOL  Take 1 tablet (10 mg total) by mouth every 6 (six) hours as needed for moderate pain.     metoCLOPramide 10 MG tablet  Commonly known as:  REGLAN  Take  1 tablet (10 mg total) by mouth every 8 (eight) hours as needed for nausea or vomiting.     MUSCLE RUB 10-15 % Crea  Apply 1 application topically 2 (two) times daily as needed for muscle pain.     norethindrone 0.35 MG tablet  Commonly known as:  MICRONOR,CAMILA,ERRIN  Take 1 tablet by mouth daily.     oxyCODONE-acetaminophen 5-325 MG per tablet  Commonly known as:  PERCOCET/ROXICET  Take 1-2 tablets by mouth every 6 (six) hours as needed for severe pain.        Meds ordered this encounter  Medications  . DISCONTD: cyclobenzaprine (FLEXERIL) 10 MG tablet    Sig: Take 1 tablet (10 mg total) by mouth 3 (three) times daily as needed for muscle spasms.    Dispense:  30 tablet    Refill:  0  . DISCONTD: metoCLOPramide (REGLAN) 10 MG tablet    Sig: Take 1 tablet (10 mg total) by mouth every 8 (eight) hours as needed for nausea or vomiting.    Dispense:  60 tablet    Refill:  1  . cyclobenzaprine (FLEXERIL) 10 MG tablet    Sig: Take 1 tablet (10 mg total) by mouth 3 (three) times daily as needed for muscle spasms.    Dispense:  30 tablet    Refill:  0  . metoCLOPramide (  REGLAN) 10 MG tablet    Sig: Take 1 tablet (10 mg total) by mouth every 8 (eight) hours as needed for nausea or vomiting.    Dispense:  60 tablet    Refill:  1    There is no immunization history for the selected administration types on file for this patient.  History reviewed. No pertinent family history.  History  Substance Use Topics  . Smoking status: Light Tobacco Smoker    Types: Cigarettes  . Smokeless tobacco: Not on file     Comment: Intermittent smoker-occasional  . Alcohol Use: No    Review of Systems   As noted in HPI  Filed Vitals:   05/24/15 1127  BP: 112/78  Pulse: 82  Temp: 98 F (36.7 C)  Resp: 16    Physical Exam  Physical Exam  Constitutional: No distress.  Eyes: EOM are normal. Pupils are equal, round, and reactive to light.  Cardiovascular: Normal rate and regular  rhythm.   Pulmonary/Chest: Breath sounds normal. No respiratory distress. She has no wheezes. She has no rales.  Abdominal: Soft. There is no tenderness. There is no rebound.  Musculoskeletal:  Left lower lumbar paraspinal tenderness, SLR negative, equal strength both lower extremity is    CBC    Component Value Date/Time   WBC 6.2 05/17/2015 0925   RBC 3.97 05/17/2015 0925   HGB 12.8 05/17/2015 0925   HCT 36.3 05/17/2015 0925   PLT 191 05/17/2015 0925   MCV 91.4 05/17/2015 0925   LYMPHSABS 1.5 09/20/2010 2219   MONOABS 0.4 09/20/2010 2219   EOSABS 0.1 09/20/2010 2219   BASOSABS 0.0 09/20/2010 2219    CMP     Component Value Date/Time   NA 137 05/17/2015 0925   K 4.3 05/17/2015 0925   CL 108 05/17/2015 0925   CO2 24 05/17/2015 0925   GLUCOSE 107* 05/17/2015 0925   BUN 10 05/17/2015 0925   CREATININE 0.64 05/17/2015 0925   CREATININE 0.52 01/04/2015 1009   CALCIUM 8.4* 05/17/2015 0925   PROT 6.7 01/04/2015 1009   ALBUMIN 3.9 01/04/2015 1009   AST 19 01/04/2015 1009   ALT 19 01/04/2015 1009   ALKPHOS 120* 01/04/2015 1009   BILITOT 0.4 01/04/2015 1009   GFRNONAA >60 05/17/2015 0925   GFRNONAA >89 01/04/2015 1009   GFRAA >60 05/17/2015 0925   GFRAA >89 01/04/2015 1009    No results found for: CHOL  No results found for: HGBA1C  Lab Results  Component Value Date/Time   AST 19 01/04/2015 10:09 AM    Assessment and Plan  Nausea without vomiting - Plan:  Results for orders placed or performed in visit on 05/24/15  Urinalysis Dipstick  Result Value Ref Range   Color, UA yellow    Clarity, UA clear    Glucose, UA neg    Bilirubin, UA neg    Ketones, UA neg    Spec Grav, UA 1.015    Blood, UA neg    pH, UA 6.5    Protein, UA neg    Urobilinogen, UA 0.2    Nitrite, UA neg    Leukocytes, UA Negative Negative  POCT urine pregnancy  Result Value Ref Range   Preg Test, Ur Negative Negative   Urinalysis Dipstick, POCT urine pregnancy, since her last beta  hCG serum was positive, will repeat the test  hCG, serum, qualitative, COMPLETE METABOLIC PANEL WITH GFR, metoCLOPramide (REGLAN) 10 MG tablet,   Back muscle spasm - Plan: cyclobenzaprine (FLEXERIL) 10 MG  tablet, advise patient to apply heating pad    Return in about 3 months (around 08/24/2015), or if symptoms worsen or fail to improve.   This note has been created with Surveyor, quantity. Any transcriptional errors are unintentional.    Lorayne Marek, MD

## 2015-05-25 ENCOUNTER — Telehealth: Payer: Self-pay | Admitting: *Deleted

## 2015-05-25 LAB — HCG, SERUM, QUALITATIVE: Preg, Serum: NEGATIVE

## 2015-05-25 NOTE — Telephone Encounter (Signed)
Pt aware of results Results given in Spanish    Notes Recorded by Lorayne Marek, MD on 05/25/2015 at 11:13 AM Call and let the Patient know that blood work is normal.

## 2015-10-30 NOTE — L&D Delivery Note (Signed)
35 y.o. [id number] at 108w3d delivered a viable female infant in cephalic, OA position. Nuchal cord x 1, easily reduced.  Anterior shoulder delivered with ease. 60 sec delayed cord clamping. Cord clamped x2 and cut. Placenta delivered spontaneously intact, with 3VC. Fundus firm on exam with massage and pitocin. Good hemostasis noted.  Laceration: None Suture: N/A Good hemostasis noted. EBL: 100cc  Mom and baby recovering in LDR.    Apgars:9,9 Weight:pending  Skin to skin, couplet care  Lovenia Kim, MD PGY-1 10/09/2016, 10:25 PM   [facility] [place]  I was gloved and present for the delivery in its entirety, and I agree with the above resident's note.    Jacquiline Doe, MD 10:31 PM

## 2016-03-15 IMAGING — CT CT HEAD W/O CM
4 of 6 series · 16 of 47 positions shown, 18 images · non-contrast
Comparison: None.

CLINICAL DATA: Status post 20 foot fall from a ladder 24 hours ago.
Headache and neck pain. Initial encounter.

EXAM:
CT HEAD WITHOUT CONTRAST
CT CERVICAL SPINE WITHOUT CONTRAST
TECHNIQUE: Multidetector CT imaging of the head and cervical spine was
performed following the standard protocol without intravenous
contrast. Multiplanar CT image reconstructions of the cervical spine
were also generated.

[Series 3: head 2.0 h70h · axial · 0.39mm/px · z∈[-144,-24]mm · 7 of 80 slices shown, 9 images]
[im 10/80  brain]
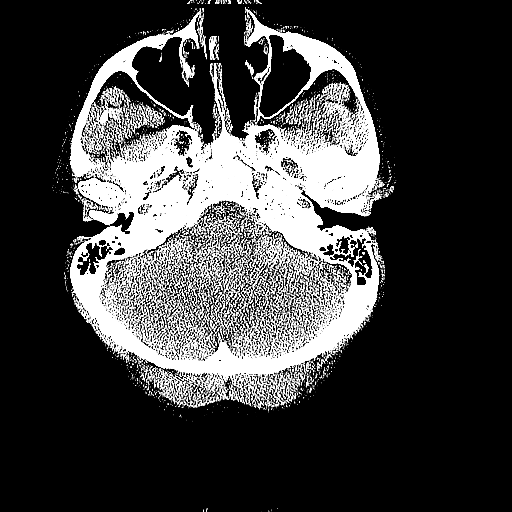
[im 10/80  bone]
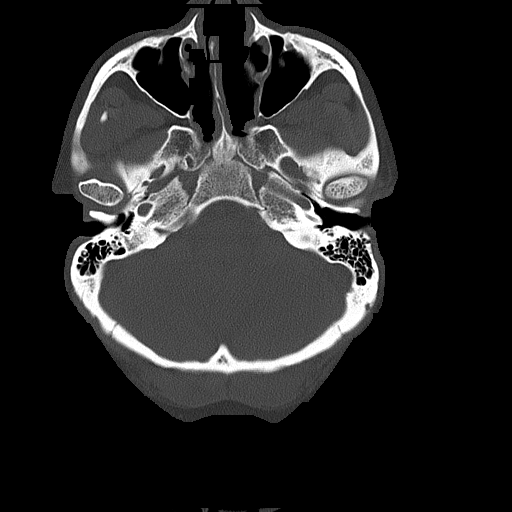
[im 20/80  brain]
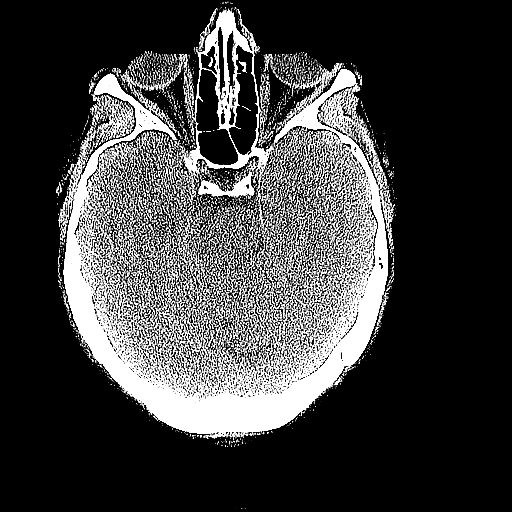
[im 30/80  brain]
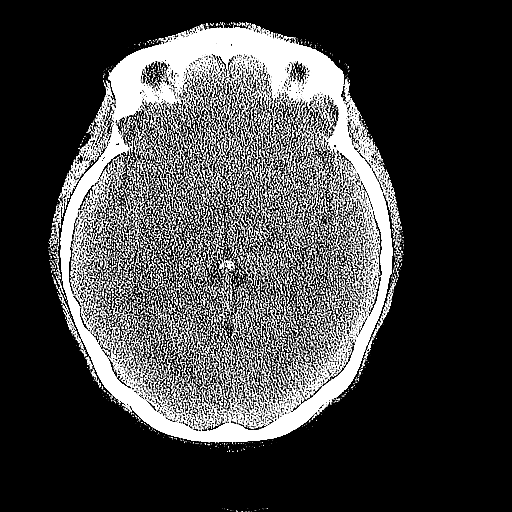
[im 40/80  brain]
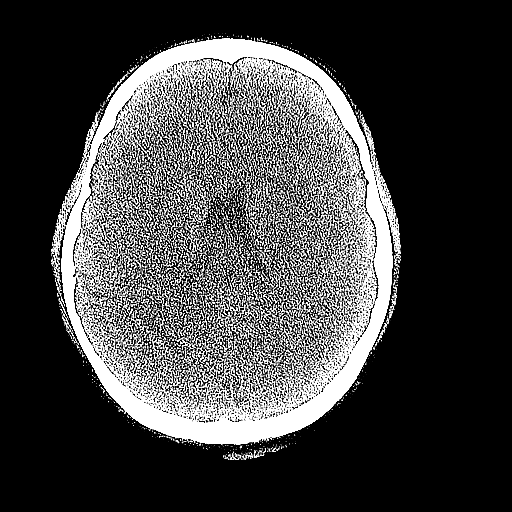
[im 50/80  brain]
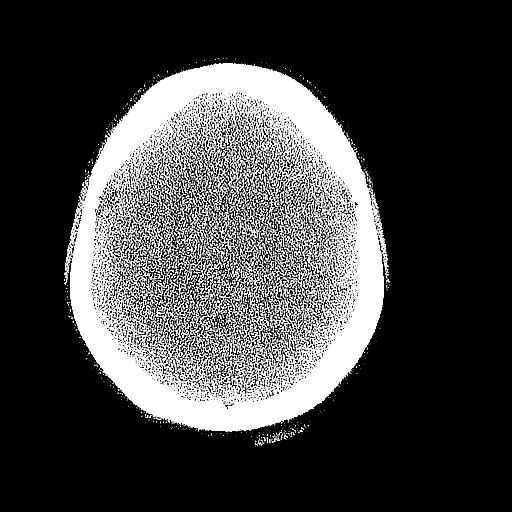
[im 50/80  bone]
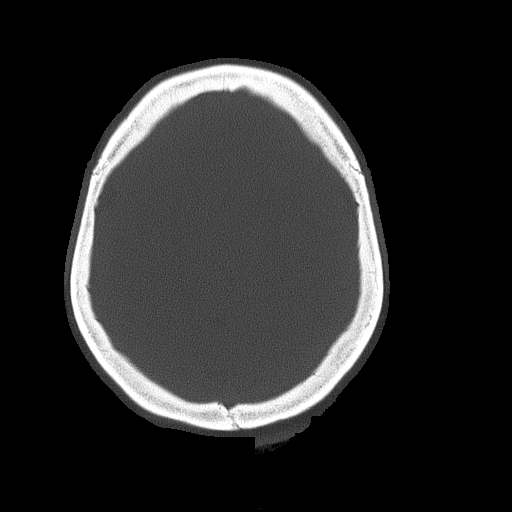
[im 60/80  brain]
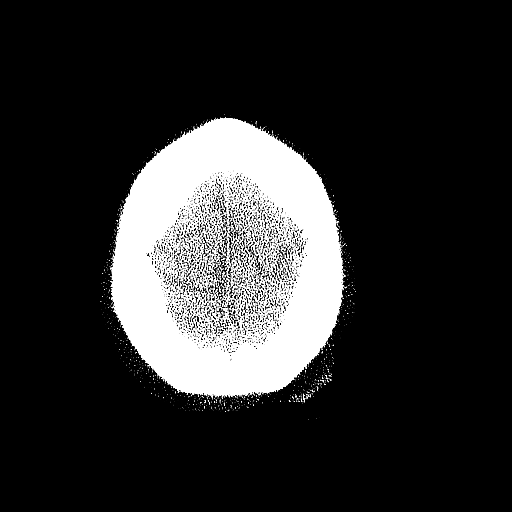
[im 70/80  brain]
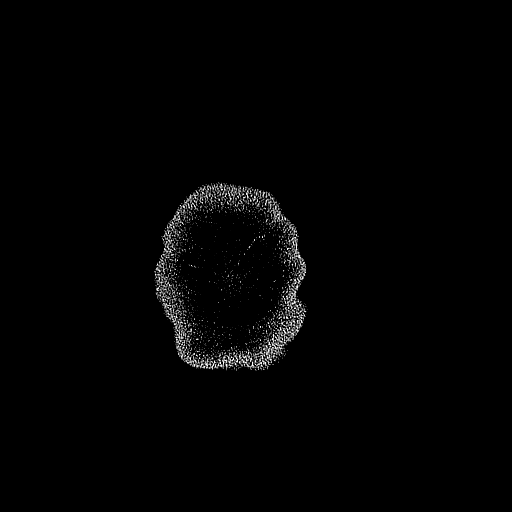

[Series 7: coronals · coronal · 0.28mm/px · 3 of 64 slices shown]
[im 22/64  brain]
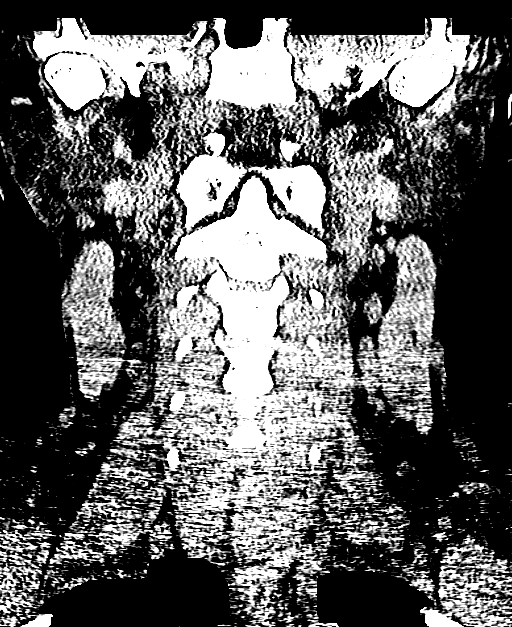
[im 29/64  brain]
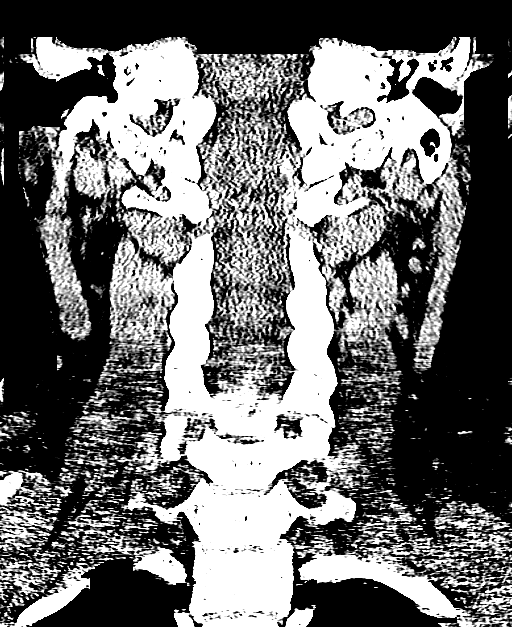
[im 36/64  brain]
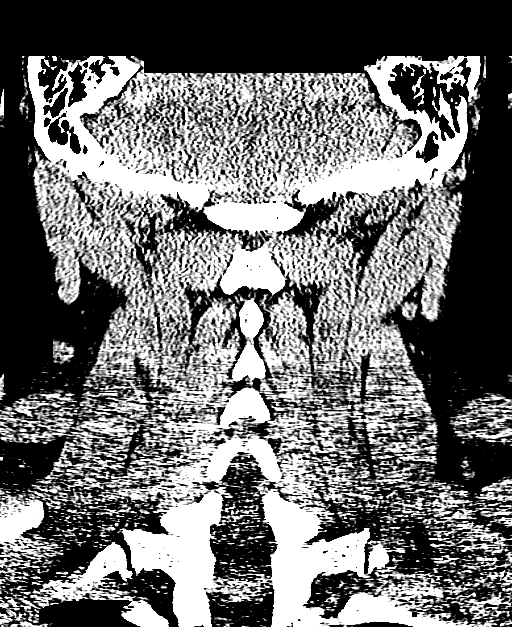

[Series 8: sagittals · sagittal · 0.28mm/px · 3 of 67 slices shown]
[im 23/67  brain]
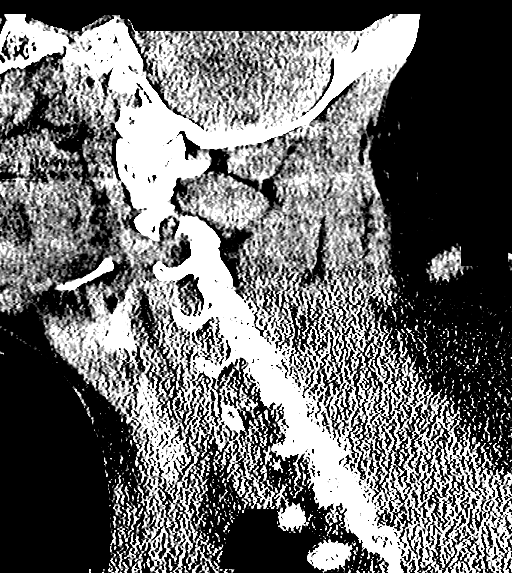
[im 34/67  brain]
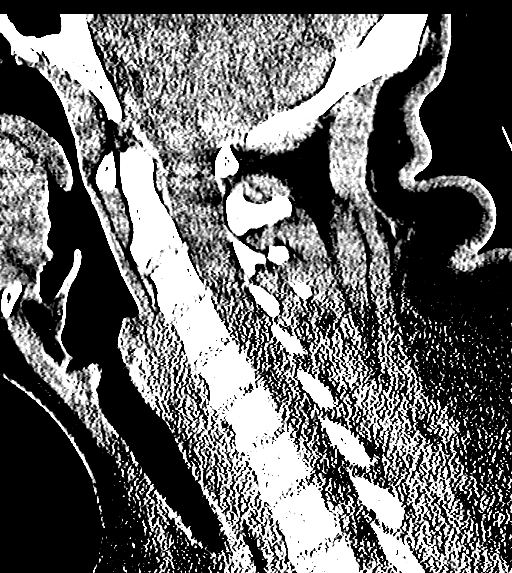
[im 45/67  brain]
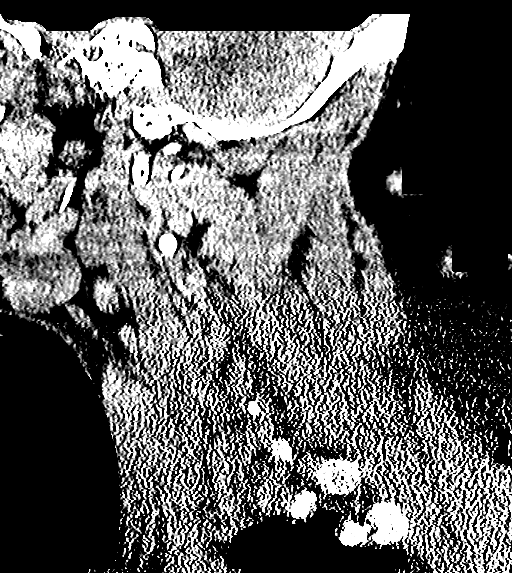

[Series 9: orthogonals · axial · 0.23mm/px · z∈[-310,-276]mm · 3 of 78 slices shown]
[im 10/78  brain]
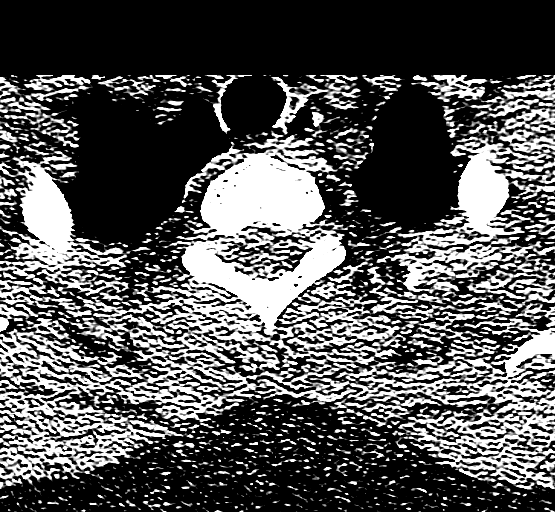
[im 20/78  brain]
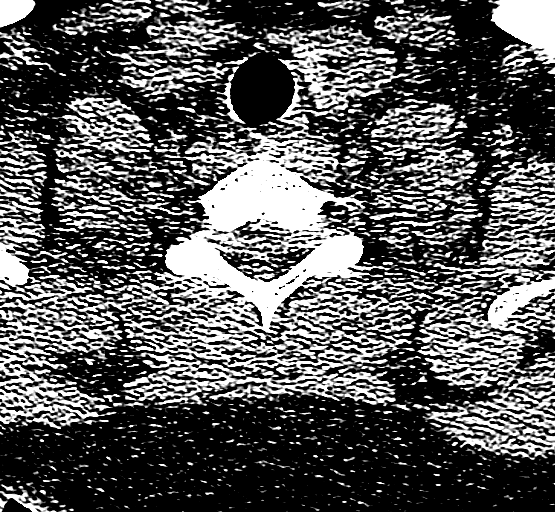
[im 29/78  brain]
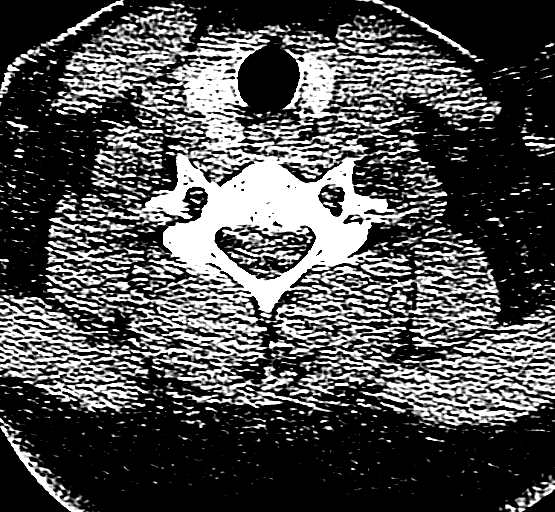

[16 of 47 positions shown; findings below may reference images not displayed]

FINDINGS: CT HEAD FINDINGS

The brain appears normal without hemorrhage, infarct, mass lesion,
mass effect, midline shift or abnormal extra-axial fluid collection.
No hydrocephalus or pneumocephalus. The calvarium is intact.

CT CERVICAL SPINE FINDINGS

There is no fracture or malalignment cervical spine. Intervertebral
disc space height is maintained. Lung apices are clear.
IMPRESSION: Negative head and cervical spine CT scans.

## 2016-03-16 IMAGING — RF DG C-ARM 61-120 MIN
1 series · 10 of 10 positions shown · non-contrast
Comparison: Radiography from the same day.

CLINICAL DATA: Right pelvis nail for fracture.

EXAM:
DG C-ARM 61-120 MIN; PELVIS - 1-2 VIEW

[Series 1: run · 10 of 10 slices shown]
[im 1/10]
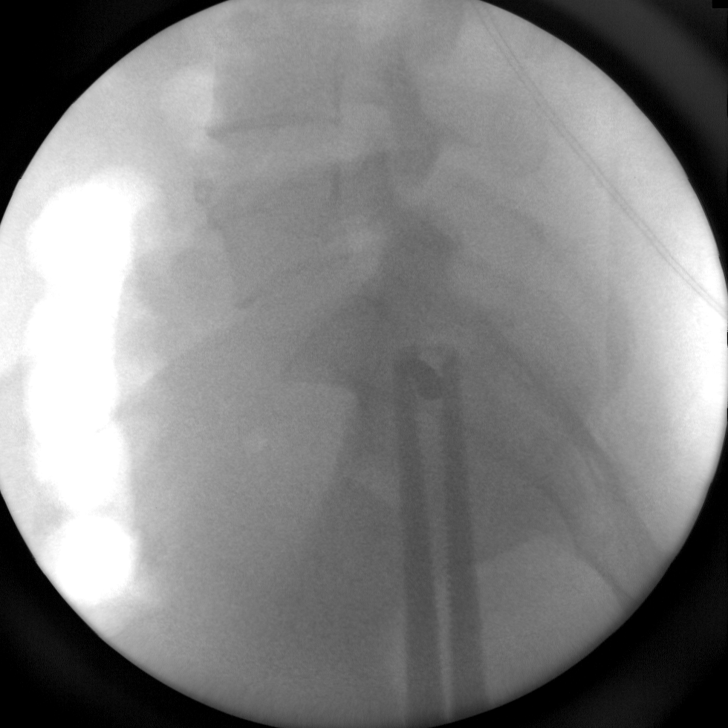
[im 2/10]
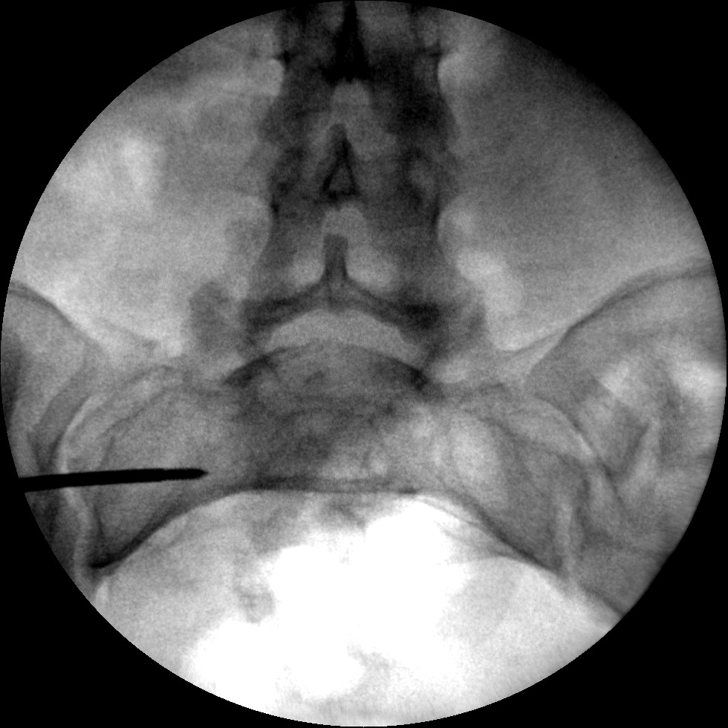
[im 3/10]
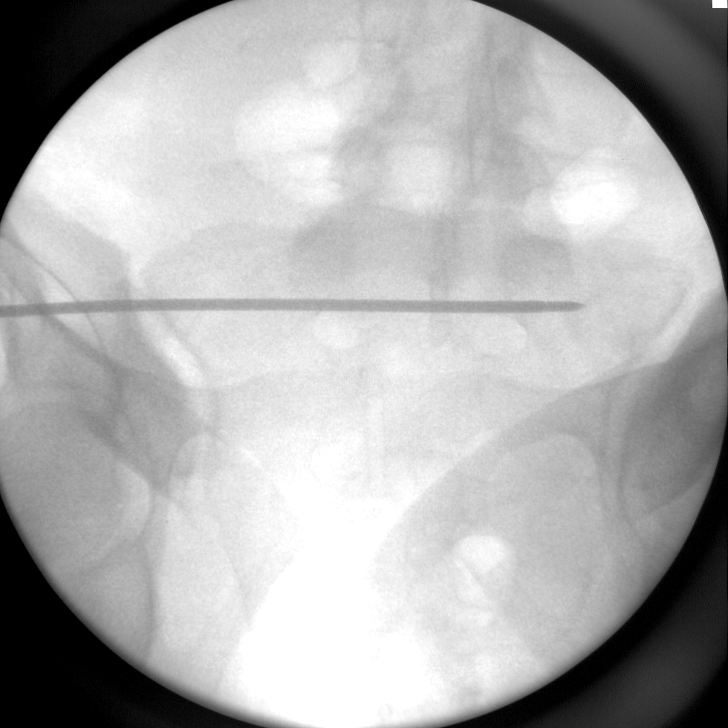
[im 4/10]
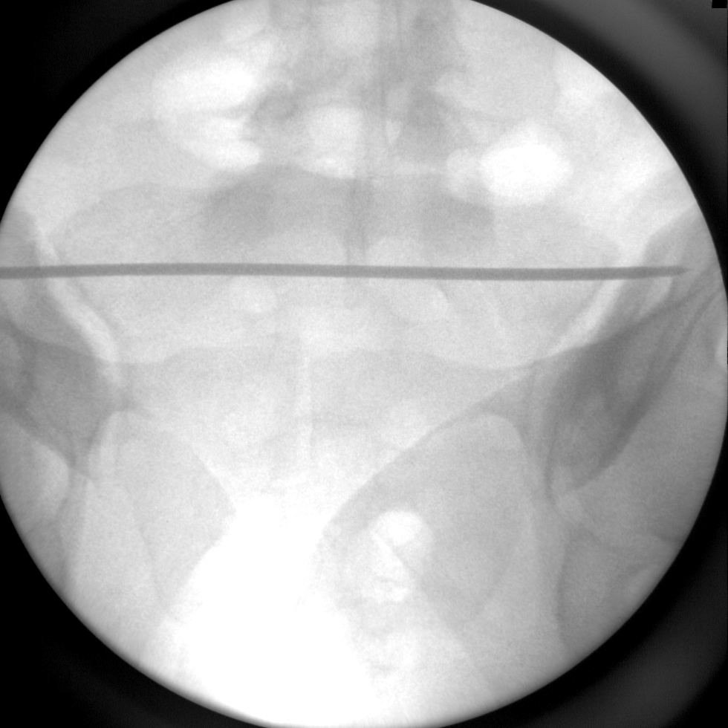
[im 5/10]
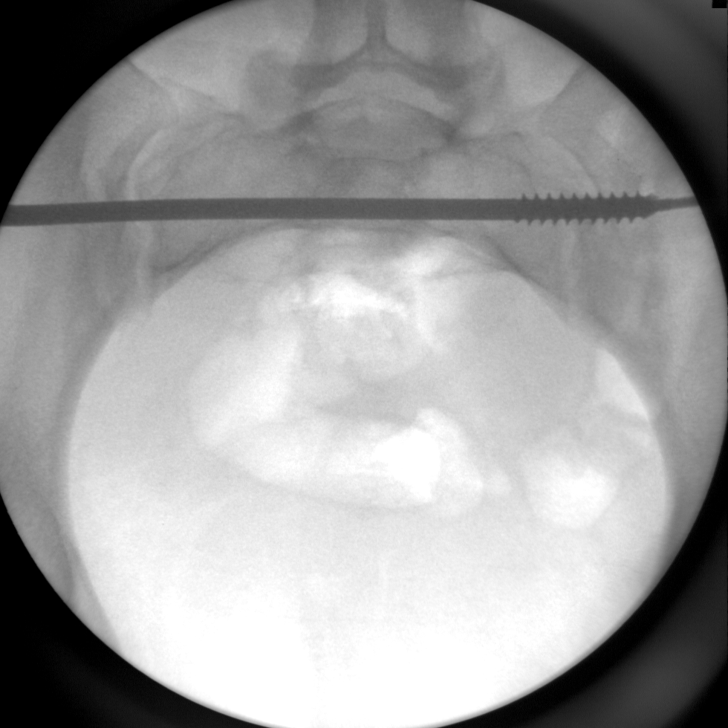
[im 6/10]
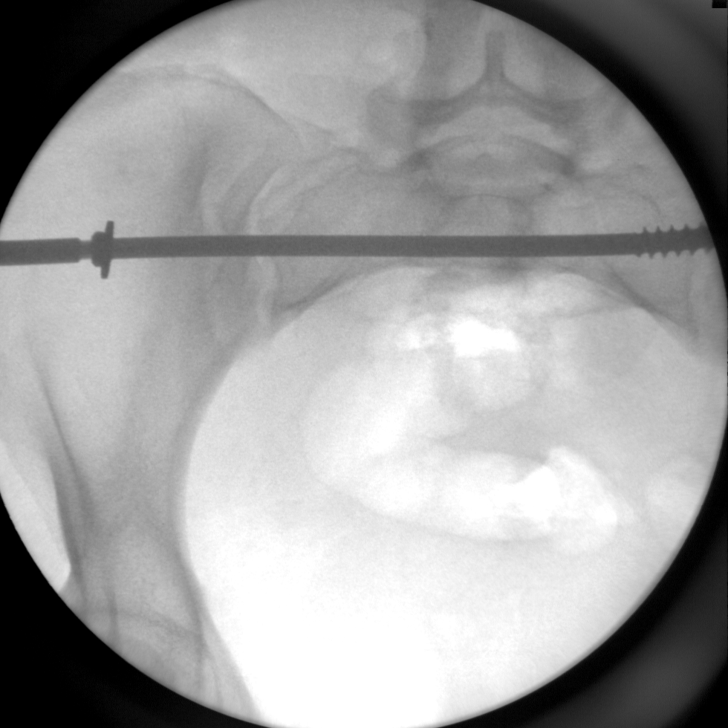
[im 7/10]
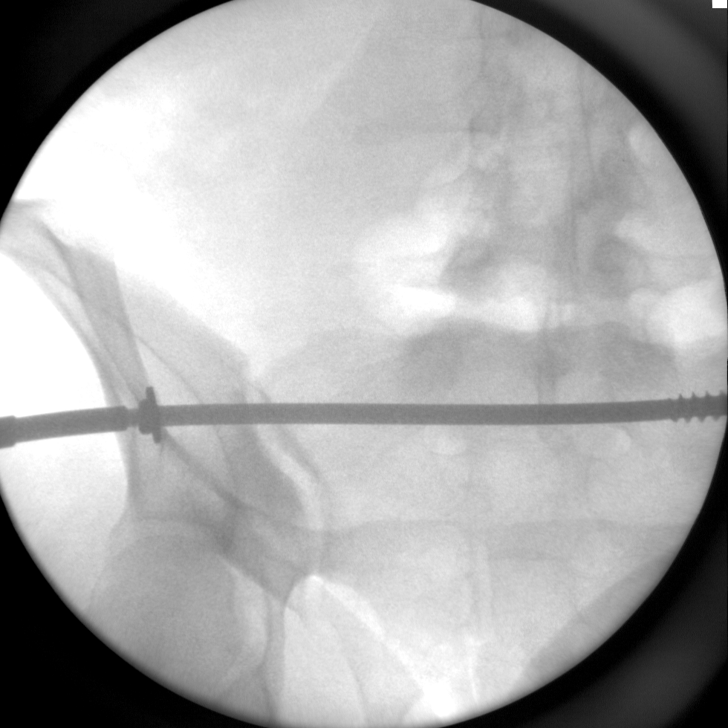
[im 8/10]
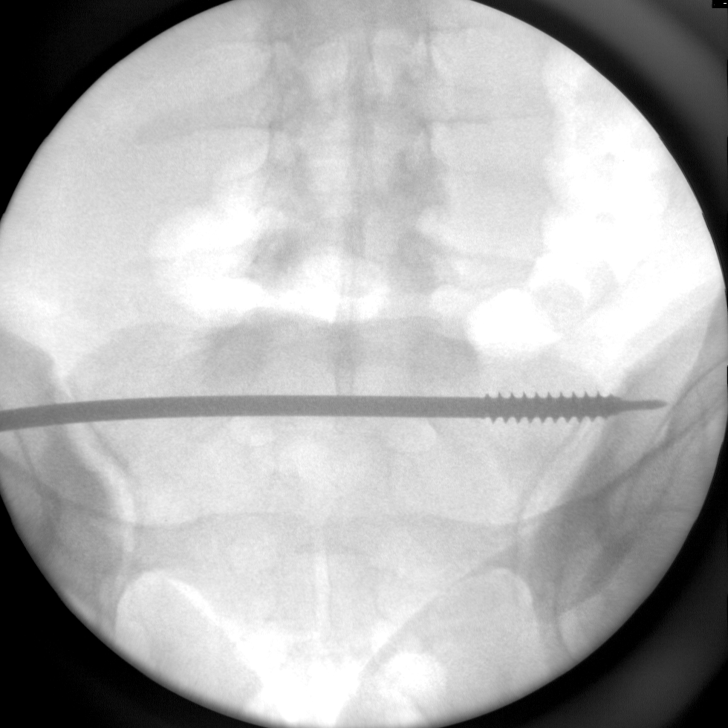
[im 9/10]
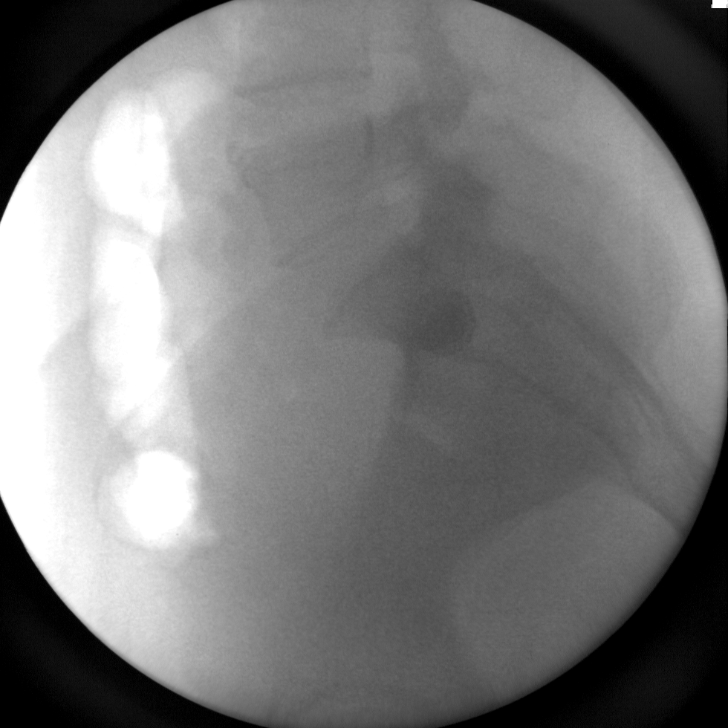
[im 10/10]
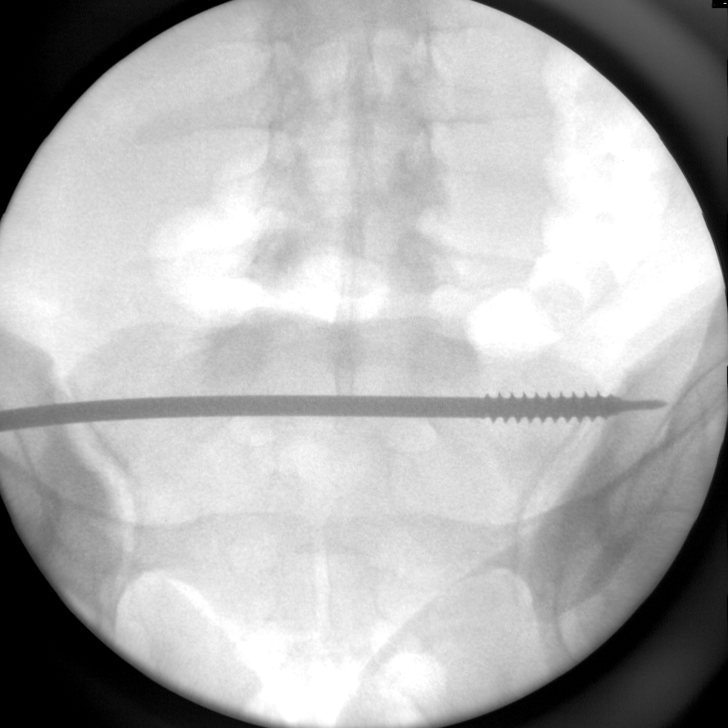

[10 of 10 positions shown; findings below may reference images not displayed]

FINDINGS: From the right a cannulated screw was placed through the sacroiliac
joints and across the fractured sacrum. Fracture alignment is
anatomic.
IMPRESSION: Status post open fixation of a right sacral fracture.

## 2016-03-16 IMAGING — CR DG PELVIS 3+V JUDET
3 series · 3 of 3 positions shown · non-contrast
Comparison: 12/17/2014 earlier.

CLINICAL DATA: Postop.

EXAM:
JUDET PELVIS - 3+ VIEW

[[person_name] view (1 of 3)]
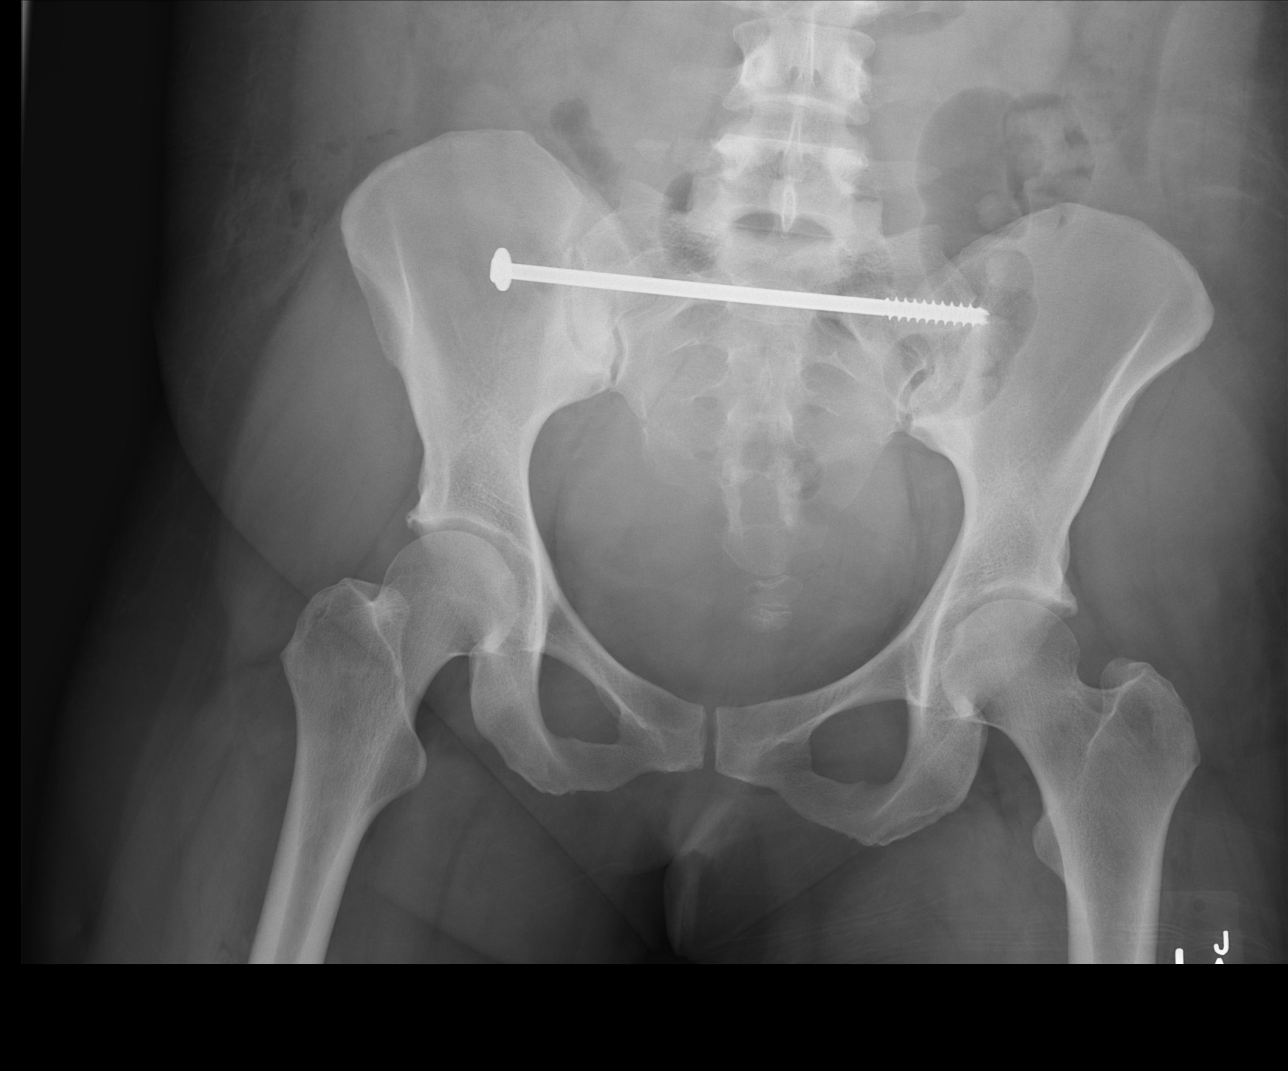

[[person_name] view (2 of 3)]
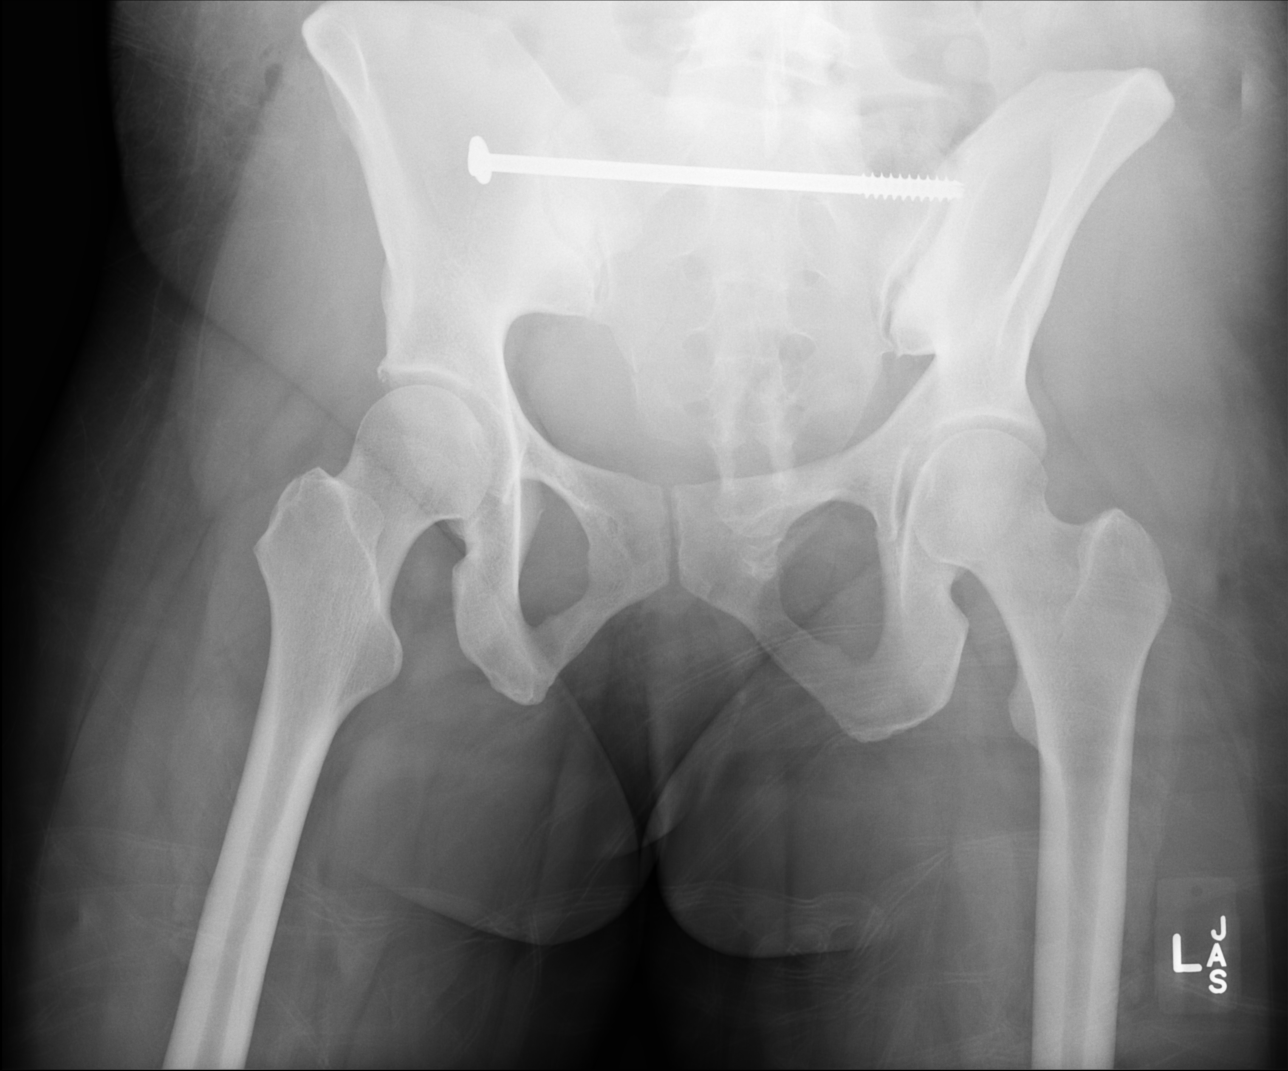

[[person_name] view (3 of 3)]
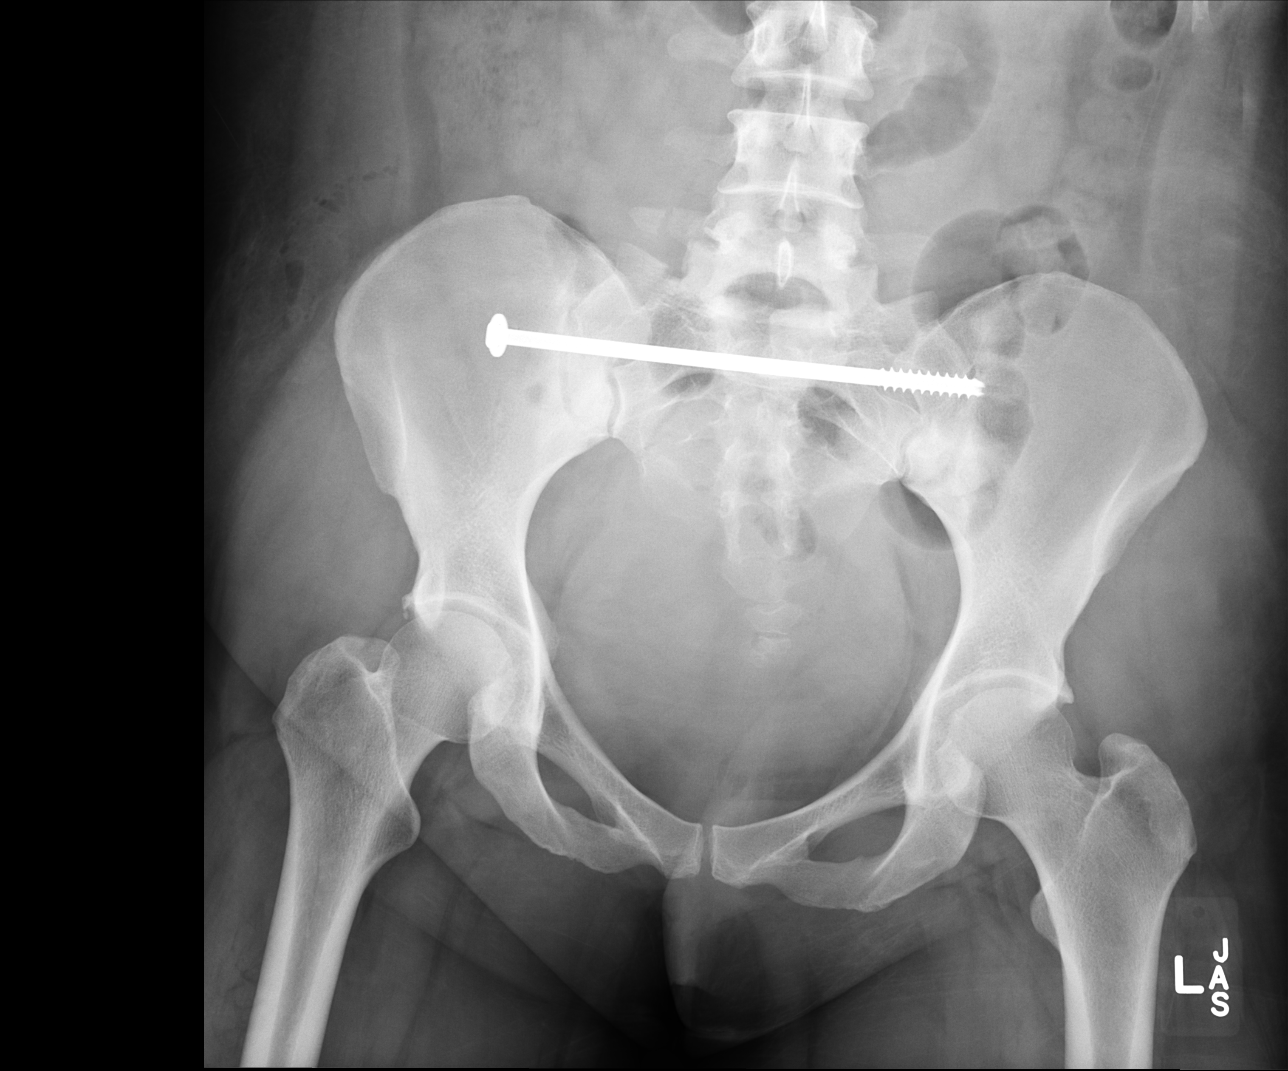

[3 of 3 positions shown; findings below may reference images not displayed]

FINDINGS: Examination demonstrates interval placement of a orthopedic screw
from right to left bridging patient's known right sacral fracture.
The right sacral fracture is otherwise unchanged. There is evidence
of patient's displaced right transverse process fracture of L5.
Remainder of the exam is unchanged.
IMPRESSION: Internal fixation of patient's right sacral fracture.

Known right L5 transverse process fracture.

## 2016-03-30 ENCOUNTER — Ambulatory Visit: Payer: No Typology Code available for payment source | Attending: Internal Medicine

## 2016-04-11 ENCOUNTER — Encounter: Payer: Self-pay | Admitting: Internal Medicine

## 2016-04-11 ENCOUNTER — Ambulatory Visit: Payer: No Typology Code available for payment source | Attending: Internal Medicine | Admitting: Internal Medicine

## 2016-04-11 VITALS — BP 115/76 | HR 83 | Temp 98.3°F | Wt 162.0 lb

## 2016-04-11 DIAGNOSIS — Z3402 Encounter for supervision of normal first pregnancy, second trimester: Secondary | ICD-10-CM

## 2016-04-11 DIAGNOSIS — Z3201 Encounter for pregnancy test, result positive: Secondary | ICD-10-CM | POA: Insufficient documentation

## 2016-04-11 DIAGNOSIS — R300 Dysuria: Secondary | ICD-10-CM | POA: Insufficient documentation

## 2016-04-11 DIAGNOSIS — N390 Urinary tract infection, site not specified: Secondary | ICD-10-CM

## 2016-04-11 DIAGNOSIS — N898 Other specified noninflammatory disorders of vagina: Secondary | ICD-10-CM | POA: Insufficient documentation

## 2016-04-11 DIAGNOSIS — Z79899 Other long term (current) drug therapy: Secondary | ICD-10-CM | POA: Insufficient documentation

## 2016-04-11 DIAGNOSIS — R112 Nausea with vomiting, unspecified: Secondary | ICD-10-CM | POA: Insufficient documentation

## 2016-04-11 LAB — POCT URINALYSIS DIPSTICK
Bilirubin, UA: NEGATIVE
Blood, UA: NEGATIVE
Glucose, UA: NEGATIVE
KETONES UA: NEGATIVE
Nitrite, UA: NEGATIVE
Protein, UA: 30
SPEC GRAV UA: 1.02
UROBILINOGEN UA: 0.2
pH, UA: 7

## 2016-04-11 LAB — CMP AND LIVER
ALT: 15 U/L (ref 6–29)
AST: 17 U/L (ref 10–30)
Albumin: 3.7 g/dL (ref 3.6–5.1)
Alkaline Phosphatase: 49 U/L (ref 33–115)
BUN: 10 mg/dL (ref 7–25)
Bilirubin, Direct: 0.1 mg/dL (ref <=0.2)
CHLORIDE: 104 mmol/L (ref 98–110)
CO2: 22 mmol/L (ref 20–31)
Calcium: 8.8 mg/dL (ref 8.6–10.2)
Creat: 0.63 mg/dL (ref 0.50–1.10)
Glucose, Bld: 117 mg/dL — ABNORMAL HIGH (ref 65–99)
Indirect Bilirubin: 0.2 mg/dL (ref 0.2–1.2)
POTASSIUM: 4.2 mmol/L (ref 3.5–5.3)
Sodium: 137 mmol/L (ref 135–146)
Total Bilirubin: 0.3 mg/dL (ref 0.2–1.2)
Total Protein: 6.7 g/dL (ref 6.1–8.1)

## 2016-04-11 LAB — CBC WITH DIFFERENTIAL/PLATELET
Basophils Absolute: 0 {cells}/uL (ref 0–200)
Basophils Relative: 0 %
Eosinophils Absolute: 88 {cells}/uL (ref 15–500)
Eosinophils Relative: 1 %
HCT: 37.5 % (ref 35.0–45.0)
Hemoglobin: 12.9 g/dL (ref 11.7–15.5)
Lymphocytes Relative: 19 %
Lymphs Abs: 1672 {cells}/uL (ref 850–3900)
MCH: 32.3 pg (ref 27.0–33.0)
MCHC: 34.4 g/dL (ref 32.0–36.0)
MCV: 94 fL (ref 80.0–100.0)
MPV: 9.9 fL (ref 7.5–12.5)
Monocytes Absolute: 352 {cells}/uL (ref 200–950)
Monocytes Relative: 4 %
Neutro Abs: 6688 {cells}/uL (ref 1500–7800)
Neutrophils Relative %: 76 %
Platelets: 221 10*3/uL (ref 140–400)
RBC: 3.99 MIL/uL (ref 3.80–5.10)
RDW: 12.8 % (ref 11.0–15.0)
WBC: 8.8 10*3/uL (ref 3.8–10.8)

## 2016-04-11 MED ORDER — NITROFURANTOIN MONOHYD MACRO 100 MG PO CAPS
100.0000 mg | ORAL_CAPSULE | Freq: Two times a day (BID) | ORAL | Status: DC
Start: 2016-04-11 — End: 2016-04-24

## 2016-04-11 MED FILL — ?NITROFURANTOIN-MACRO 100 M: 100 | 3 days supply | Qty: 6 | Fill #0

## 2016-04-11 NOTE — Patient Instructions (Signed)
OB office/ [facility] will call you soon for 2 wk appoitment.  Segundo trimestre de Media planner (Second Trimester of Pregnancy) El segundo trimestre va desde la semana13 hasta la 60, desde el cuarto hasta el sexto mes, y suele ser el momento en el que mejor se siente. Su organismo se ha adaptado a Public relations account executive y comienza a Print production planner. En general, las nuseas matutinas han disminuido o han desaparecido completamente, puede tener ms energa y un aumento de apetito. El segundo trimestre es tambin la poca en la que el feto se desarrolla rpidamente. Hacia el final del sexto mes, el feto mide aproximadamente 9pulgadas (23cm) y pesa alrededor de 1 libras (700g). Es probable que sienta que el beb se Software engineer (da pataditas) entre las 65 y 20semanas del Media planner. CAMBIOS EN EL ORGANISMO Su organismo atraviesa por muchos cambios durante el [place], y estos varan de Ardelia Mems mujer a Theatre manager.   Seguir [employer]. Notar que la parte baja del abdomen sobresale.  Podrn aparecer las primeras Apache Corporation caderas, el abdomen y las [place].  Es posible que tenga dolores de cabeza que pueden aliviarse con los medicamentos que el mdico le permita tomar.  Tal vez tenga necesidad de orinar con ms frecuencia porque el feto est ejerciendo presin Field seismologist.  Debido al Glennis Brink podr sentir Victorio Palm estomacal con frecuencia.  Puede estar estreida, ya que ciertas hormonas enlentecen los movimientos de los msculos que JPM[employer] Chase [employer] Co desechos a travs de los intestinos.  Pueden aparecer hemorroides o abultarse e hincharse las venas (venas varicosas).  Puede tener dolor de espalda que se debe al Southern Company de peso y a que las hormonas del Scientist, research (life sciences) las articulaciones entre los huesos de la pelvis, y Civil Service fast streamer consecuencia de la modificacin del peso y los msculos que mantienen el equilibrio.  Las Lincoln National Corporation seguirn creciendo y Teaching laboratory technician.  Las Production manager y estar sensibles al  cepillado y al hilo dental.  Pueden aparecer zonas oscuras o manchas (cloasma, mscara del Media planner) en el rostro que probablemente se atenuar despus del nacimiento del beb.  Es posible que se forme una lnea oscura desde el ombligo hasta la zona del pubis (linea nigra) que probablemente se atenuar despus del nacimiento del beb.  Tal vez haya cambios en el cabello que pueden incluir su engrosamiento, crecimiento rpido y cambios en la textura. Adems, a algunas mujeres se les cae el cabello durante o despus del embarazo, o tienen el cabello seco o fino. Lo ms probable es que el cabello se le normalice despus del nacimiento del beb. QU DEBE ESPERAR EN LAS CONSULTAS PRENATALES Durante una visita prenatal de rutina:  La pesarn para asegurarse de que usted y el feto estn creciendo normalmente.  Le tomarn la presin arterial.  Le medirn el abdomen para controlar el desarrollo del beb.  Se escucharn los latidos cardacos fetales.  Se evaluarn los resultados de los estudios solicitados en visitas anteriores. El mdico puede preguntarle lo siguiente:  Cmo se siente.  Si siente los movimientos del beb.  Si ha tenido sntomas anormales, como prdida de lquido, [place], dolores de cabeza intensos o clicos abdominales.  Si est consumiendo algn producto que contenga tabaco, como cigarrillos, tabaco de Higher education careers adviser y Psychologist, sport and exercise.  Si tiene [employer]. Otros estudios que podrn realizarse durante el segundo trimestre incluyen lo siguiente:  Anlisis de sangre para detectar lo siguiente:  Concentraciones de hierro bajas (anemia).  Diabetes gestacional (entre la semana 24 y la 94).  Anticuerpos  Rh.  Anlisis de orina para detectar infecciones, diabetes o protenas en la orina.  Una ecografa para confirmar que el beb crece y se desarrolla correctamente.  Una amniocentesis para diagnosticar posibles problemas genticos.  Estudios del feto para  descartar espina bfida y sndrome de Down.  Prueba del VIH (virus de inmunodeficiencia humana). Los exmenes prenatales de rutina incluyen la prueba de deteccin del VIH, a menos que decida no Radiation protection practitioner. INSTRUCCIONES PARA EL CUIDADO EN EL HOGAR   Evite fumar, consumir hierbas, beber alcohol y tomar frmacos que no le hayan recetado. Estas sustancias qumicas afectan la formacin y el desarrollo del beb.  No consuma ningn producto que contenga tabaco, lo que incluye cigarrillos, tabaco de Higher education careers adviser y Psychologist, sport and exercise. Si necesita ayuda para dejar de fumar, consulte al [employer]. Puede recibir asesoramiento y otro tipo de recursos para dejar de fumar.  [place] mdico en relacin con el uso de medicamentos. Durante el embarazo, hay medicamentos que son seguros de tomar y otros que no.  Haga ejercicio solamente como se lo haya indicado el mdico. Sentir clicos uterinos es un buen signo para Ambulance person actividad fsica.  Contine comiendo alimentos sanos con regularidad.  Use un sostn que le brinde buen soporte si le Nordstrom.  No se d baos de inmersin en agua caliente, baos turcos ni saunas.  Use el cinturn de seguridad en todo momento mientras conduce.  No coma carne cruda ni queso sin cocinar; evite el contacto con las bandejas sanitarias de los gatos y la tierra que estos animales usan. Estos elementos contienen grmenes que pueden causar defectos congnitos en el beb.  Biggers.  Tome entre 1500 y 2000mg  de calcio diariamente comenzando en la [id number] del embarazo [place].  Si est estreida, pruebe un laxante suave (si el mdico lo autoriza). Consuma ms alimentos ricos en fibra, como vegetales y frutas frescos y Psychologist, prison and probation services. Beba gran cantidad de lquido para mantener la orina de tono claro o color amarillo plido.  Dese baos de asiento con agua tibia para Best boy o las molestias causadas por las  hemorroides. Use una crema para las hemorroides si el mdico la autoriza.  Si tiene venas varicosas, use medias de descanso. Eleve los pies durante 80minutos, 3 o 4veces por da. Limite el consumo de sal en su dieta.  No levante objetos pesados, use zapatos de tacones bajos y mantenga una buena postura.  Descanse con las piernas elevadas si tiene calambres o dolor de cintura.  Visite a su dentista si an no lo ha Quarry manager. Use un cepillo de dientes blando para higienizarse los dientes y psese el hilo dental con suavidad.  Puede seguir [employer] Electric Power, a menos que el mdico le indique lo contrario.  Concurra a todas las visitas prenatales segn las indicaciones de su mdico. SOLICITE ATENCIN MDICA SI:   Natale Milch.  Siente clicos leves, presin en la pelvis o dolor persistente en el abdomen.  Tiene nuseas, vmitos o diarrea persistentes.  Margette Fast secrecin vaginal con mal olor.  Siente dolor al Continental Airlines. SOLICITE ATENCIN MDICA DE INMEDIATO SI:   Tiene fiebre.  Tiene una prdida de lquido por la vagina.  Tiene sangrado o pequeas prdidas vaginales.  Siente dolor intenso o clicos en el abdomen.  Sube o baja de peso rpidamente.  Tiene dificultad para respirar y siente dolor de pecho.  Sbitamente se le Gray[employer], las [place], [place], Hermann  pies o las piernas.  No ha sentido los movimientos del beb durante Leone Brand.  Siente un dolor de cabeza intenso que no se alivia con medicamentos.  Su visin se modifica.   Esta informacin no tiene Marine scientist el consejo del mdico. Asegrese de hacerle al mdico cualquier pregunta que tenga.   Document Released: 07/25/2005 Document Revised: 11/05/2014 Elsevier Interactive Patient Education [employer].

## 2016-04-11 NOTE — Progress Notes (Addendum)
Hailey Walsh, is a 35 y.o. female  [id number]  [id number]  DOB - Sep 21, 1981  CC:  Chief Complaint  Patient presents with  . Establish Care  . Vaginal Discharge    green; odor x        HPI: Hailey Walsh is a 35 y.o. female [phone], here today to establish medical care., last seen in clinic 7/16.  She is here w/ her young dgt and niece.  She states she is 85months pregnant, has not seen OB, no prenatal care.  She saw a doctor at [facility] clinic initially, and was given rx amoxicillin for uti, and she states they never f/u w/ her afterwards.  C/o of mild n/emesis still, especially when fatigued.  She c/o of intermittent arm/hands swelling as well, comes /goes.  Denies ha/visual problems.  Intermittent dysuria, none currently.  +vaginal discharge as well.  No hematuria, hematochezia, blood vaginal discharge.  Patient has No headache, No chest pain, No abdominal pain - No Nausea, No new weakness tingling or numbness, No Cough - SOB.  Interpreter was used to communicate directly with patient for the entire encounter including providing detailed patient instructions.   No Known Allergies Past Medical History  Diagnosis Date  . Incompetent cervix 2011  . Anxiety   . DVT (deep venous thrombosis) ([place]) 11/2014   Current Outpatient Prescriptions on File Prior to Visit  Medication Sig Dispense Refill  . cyclobenzaprine (FLEXERIL) 10 MG tablet Take 1 tablet (10 mg total) by mouth 3 (three) times daily as needed for muscle spasms. (Patient not taking: Reported on 04/11/2016) 30 tablet 0  . ketorolac (TORADOL) 10 MG tablet Take 1 tablet (10 mg total) by mouth every 6 (six) hours as needed for moderate pain. (Patient not taking: Reported on 04/11/2016) 20 tablet 0  . Menthol-Methyl Salicylate (MUSCLE RUB) 10-15 % CREA Apply 1 application topically 2 (two) times daily as needed for muscle pain. Reported on 04/11/2016    . metoCLOPramide (REGLAN) 10 MG tablet Take 1 tablet  (10 mg total) by mouth every 8 (eight) hours as needed for nausea or vomiting. (Patient not taking: Reported on 04/11/2016) 60 tablet 1  . norethindrone (MICRONOR,CAMILA,ERRIN) 0.35 MG tablet Take 1 tablet by mouth daily. Reported on 04/11/2016    . oxyCODONE-acetaminophen (PERCOCET/ROXICET) 5-325 MG per tablet Take 1-2 tablets by mouth every 6 (six) hours as needed for severe pain. (Patient not taking: Reported on 04/11/2016) 30 tablet 0   No current facility-administered medications on file prior to visit.   No family history on file. Social History   Social History  . Marital Status: Single    Spouse Name: N/A  . Number of Children: N/A  . Years of Education: N/A   Occupational History  . Not on file.   Social History Main Topics  . Smoking status: Light Tobacco Smoker    Types: Cigarettes  . Smokeless tobacco: Not on file     Comment: Intermittent smoker-occasional  . Alcohol Use: No  . Drug Use: No  . Sexual Activity: Yes    Birth Control/ Protection: Injection   Other Topics Concern  . Not on file   Social History Narrative    Review of Systems: Per HPI, otherwise all systems reviewed and negative.  Objective:   Filed Vitals:   04/11/16 0924  BP: 115/76  Pulse: 83  Temp: 98.3 F (36.8 C)    Filed Weights   04/11/16 0924  Weight: 162 lb (73.483 kg)    BP Readings from  Last 3 Encounters:  04/11/16 115/76  05/24/15 112/78  05/17/15 128/76    Physical Exam: Constitutional: Patient appears well-developed and well-nourished. No distress. AAOx3, pleasant., obese. HENT: Normocephalic, atraumatic, External right and left ear normal. Oropharynx is clear and moist.  Eyes: Conjunctivae and EOM are normal. PERRL, no scleral icterus. Neck: Normal ROM. Neck supple. No JVD.  CVS: RRR, S1/S2 +, no murmurs, no gallops, no carotid bruit.  Pulmonary: Effort and breath sounds normal, no stridor, rhonchi, wheezes, rales.  Abdominal: Soft. BS +, no distension, tenderness,  rebound or guarding.  Musculoskeletal: Gravid abdomen, Normal range of motion. No edema and no tenderness.  LE: bilat/ no c/c/e, pulses 2+ bilateral. Neuro: Alert. muscle tone coordination wnl. No cranial nerve deficit grossly. Skin: Skin is warm and dry. No rash noted. Not diaphoretic. No erythema. No pallor. Psychiatric: Normal mood and affect. Behavior, judgment, thought content normal.  Lab Results  Component Value Date   WBC 6.2 05/17/2015   HGB 12.8 05/17/2015   HCT 36.3 05/17/2015   MCV 91.4 05/17/2015   PLT 191 05/17/2015   Lab Results  Component Value Date   CREATININE 0.62 05/24/2015   BUN 12 05/24/2015   NA 141 05/24/2015   K 4.2 05/24/2015   CL 104 05/24/2015   CO2 26 05/24/2015    No results found for: HGBA1C Lipid Panel  No results found for: CHOL, TRIG, HDL, CHOLHDL, VLDL, LDLCALC     Depression screen [facility] 2/9 04/11/2016 01/04/2015  Decreased Interest 0 0  Down, Depressed, Hopeless 0 0  PHQ - 2 Score 0 0  Altered sleeping 0 -  Tired, decreased energy 0 -  Change in appetite 0 -  Feeling bad or failure about yourself  0 -  Trouble concentrating 0 -  Moving slowly or fidgety/restless 0 -  Suicidal thoughts 0 -  PHQ-9 Score 0 -  Difficult doing work/chores Not difficult at all -   ua dip 04/11/16 Hazy, small 1+ LE, neg Nitrite.   Assessment and plan:   1. Gravida 1, second trimester, [id number] Per pt, 74month pregnant, no prenatal care.  She saw a Family practice initially, but never had f/u, trx w/ "amoxicillin" for uti at time - CBC with Differential - CMP and Liver - hCG, serum, qualitative - TSH - T4, free - Urinalysis Dipstick - Ambulatory referral to Obstetrics / Gynecology - I spoke w/ Manuela Schwartz from [facility], [phone], they will call pt for appt, earliest next appt is 2 wks.  2. Nausea and vomiting, intractability of vomiting not specified, unspecified vomiting type Per pt, tolerable.  3. Dysuria, with possible UTI on ua. - chk ucx, -  emp macrobid bid x 3 days - f/u cultures.    Return in about 3 months (around 07/12/2016), or if symptoms worsen or fail to improve.  The patient was given clear instructions to go to ER or return to medical center if symptoms don't improve, worsen or new problems develop. The patient verbalized understanding. The patient was told to call to get lab results if they haven't heard anything in the next week.      Maren Reamer, MD, [place], [place]   04/11/2016, 10:09 AM

## 2016-04-12 LAB — TSH: TSH: 1.04 m[IU]/L

## 2016-04-12 LAB — URINE CULTURE

## 2016-04-12 LAB — HCG, SERUM, QUALITATIVE: Preg, Serum: POSITIVE — ABNORMAL HIGH

## 2016-04-12 LAB — T4, FREE: Free T4: 1.3 ng/dL (ref 0.8–1.8)

## 2016-04-19 ENCOUNTER — Other Ambulatory Visit (HOSPITAL_COMMUNITY)
Admission: AD | Admit: 2016-04-19 | Discharge: 2016-04-19 | Disposition: A | Discharge status: Home / Self Care | Payer: No Typology Code available for payment source | Source: Ambulatory Visit | Attending: Obstetrics & Gynecology | Admitting: Obstetrics & Gynecology

## 2016-04-19 DIAGNOSIS — Z3A00 Weeks of gestation of pregnancy not specified: Secondary | ICD-10-CM | POA: Insufficient documentation

## 2016-04-19 DIAGNOSIS — O09893 Supervision of other high risk pregnancies, third trimester: Secondary | ICD-10-CM | POA: Insufficient documentation

## 2016-04-19 DIAGNOSIS — I824Y1 Acute embolism and thrombosis of unspecified deep veins of right proximal lower extremity: Secondary | ICD-10-CM

## 2016-04-19 LAB — OB RESULTS CONSOLE RUBELLA ANTIBODY, IGM: RUBELLA: IMMUNE

## 2016-04-19 LAB — OB RESULTS CONSOLE HGB/HCT, BLOOD
HEMATOCRIT: 38 %
HEMOGLOBIN: 13 g/dL

## 2016-04-19 LAB — PROTIME-INR
INR: 0.95 (ref 0.00–1.49)
Prothrombin Time: 12.9 s (ref 11.6–15.2)

## 2016-04-19 LAB — OB RESULTS CONSOLE ANTIBODY SCREEN: Antibody Screen: NEGATIVE

## 2016-04-19 LAB — OB RESULTS CONSOLE HEPATITIS B SURFACE ANTIGEN: Hepatitis B Surface Ag: NEGATIVE

## 2016-04-19 LAB — OB RESULTS CONSOLE GC/CHLAMYDIA
Chlamydia: NEGATIVE
GC PROBE AMP, GENITAL: NEGATIVE

## 2016-04-19 LAB — OB RESULTS CONSOLE VARICELLA ZOSTER ANTIBODY, IGG: Varicella: IMMUNE

## 2016-04-19 LAB — OB RESULTS CONSOLE ABO/RH: RH Type: POSITIVE

## 2016-04-19 LAB — ANTITHROMBIN III: ANTITHROMB III FUNC: 114 % (ref 75–120)

## 2016-04-19 LAB — OB RESULTS CONSOLE RPR: RPR: NONREACTIVE

## 2016-04-19 LAB — OB RESULTS CONSOLE HIV ANTIBODY (ROUTINE TESTING): HIV: NONREACTIVE

## 2016-04-19 LAB — OB RESULTS CONSOLE PLATELET COUNT: Platelets: 209 10*3/uL

## 2016-04-21 LAB — PROTEIN S, TOTAL: PROTEIN S AG TOTAL: 72 % (ref 60–150)

## 2016-04-21 LAB — BETA-2-GLYCOPROTEIN I ABS, IGG/M/A
Beta-2-Glycoprotein I IgA: 102 GPI IgA units — ABNORMAL HIGH (ref 0–25)
Beta-2-Glycoprotein I IgM: <9 GPI IgM units (ref 0–32)

## 2016-04-21 LAB — LUPUS ANTICOAGULANT PANEL
DRVVT: 37.9 s (ref 0.0–47.0)
PTT LA: 30.2 s (ref 0.0–43.6)

## 2016-04-21 LAB — CARDIOLIPIN ANTIBODIES, IGM+IGG
Anticardiolipin IgG: <9 GPL U/mL (ref 0–14)
Anticardiolipin IgM: <9 MPL U/mL (ref 0–12)

## 2016-04-21 LAB — CARDIOLIPIN ANTIBODY, IGA

## 2016-04-22 LAB — PROTEIN C, TOTAL: Protein C, Total: 76 % (ref 60–150)

## 2016-04-23 NOTE — Progress Notes (Signed)
Done per Eda , Spanish translater, Forde Dandy

## 2016-04-24 ENCOUNTER — Encounter (HOSPITAL_COMMUNITY)
Admission: RE | Disposition: A | Discharge status: Home / Self Care | Payer: Self-pay | Source: Ambulatory Visit | Attending: Family Medicine

## 2016-04-24 ENCOUNTER — Ambulatory Visit (HOSPITAL_COMMUNITY): Payer: Self-pay | Admitting: Anesthesiology

## 2016-04-24 ENCOUNTER — Encounter (HOSPITAL_COMMUNITY): Payer: Self-pay | Admitting: *Deleted

## 2016-04-24 ENCOUNTER — Ambulatory Visit (HOSPITAL_COMMUNITY)
Admission: RE | Admit: 2016-04-24 | Discharge: 2016-04-24 | Disposition: A | Discharge status: Home / Self Care | Payer: No Typology Code available for payment source | Source: Ambulatory Visit | Attending: Family Medicine | Admitting: Family Medicine

## 2016-04-24 DIAGNOSIS — Z87891 Personal history of nicotine dependence: Secondary | ICD-10-CM | POA: Insufficient documentation

## 2016-04-24 DIAGNOSIS — O3430 Maternal care for cervical incompetence, unspecified trimester: Secondary | ICD-10-CM | POA: Diagnosis present

## 2016-04-24 DIAGNOSIS — Z3A15 15 weeks gestation of pregnancy: Secondary | ICD-10-CM

## 2016-04-24 DIAGNOSIS — O3432 Maternal care for cervical incompetence, second trimester: Secondary | ICD-10-CM | POA: Insufficient documentation

## 2016-04-24 HISTORY — PX: CERVICAL CERCLAGE: SHX1329

## 2016-04-24 HISTORY — DX: Depression, unspecified: F32.A

## 2016-04-24 HISTORY — DX: Major depressive disorder, single episode, unspecified: F32.9

## 2016-04-24 SURGERY — CERCLAGE, CERVIX, VAGINAL APPROACH
Anesthesia: Spinal

## 2016-04-24 MED ORDER — 0.9 % SODIUM CHLORIDE (POUR BTL) OPTIME
TOPICAL | Status: DC | PRN
  Administered 2016-04-24: 1000 mL

## 2016-04-24 MED ORDER — BUPIVACAINE HCL (PF) 0.25 % IJ SOLN
INTRAMUSCULAR | Status: AC
  Filled 2016-04-24: qty 30

## 2016-04-24 MED ORDER — LACTATED RINGERS IV SOLN: INTRAVENOUS | Status: DC

## 2016-04-24 MED ORDER — BUPIVACAINE HCL (PF) 0.25 % IJ SOLN
INTRAMUSCULAR | Status: AC
  Filled 2016-04-24: qty 10

## 2016-04-24 MED ORDER — BUPIVACAINE HCL (PF) 0.75 % IJ SOLN
INTRAMUSCULAR | Status: DC | PRN
  Administered 2016-04-24: 7.5 mg via INTRATHECAL

## 2016-04-24 MED ORDER — INDOMETHACIN 50 MG PO CAPS: 50.0000 mg | ORAL_CAPSULE | Freq: Four times a day (QID) | ORAL | Status: DC

## 2016-04-24 MED ORDER — FENTANYL CITRATE (PF) 100 MCG/2ML IJ SOLN: 25.0000 ug | INTRAMUSCULAR | Status: DC | PRN

## 2016-04-24 MED ORDER — LACTATED RINGERS IV SOLN
INTRAVENOUS | Status: DC
  Administered 2016-04-24 (×2): via INTRAVENOUS

## 2016-04-24 SURGICAL SUPPLY — 18 items
CLOTH BEACON ORANGE TIMEOUT ST (SAFETY) ×3 IMPLANT
COUNTER NEEDLE 1200 MAGNETIC (NEEDLE) IMPLANT
GLOVE BIOGEL PI IND STRL 7.0 (GLOVE) ×2 IMPLANT
GLOVE BIOGEL PI INDICATOR 7.0 (GLOVE) ×4
GLOVE ECLIPSE 7.0 STRL STRAW (GLOVE) ×6 IMPLANT
GOWN STRL REUS W/TWL LRG LVL3 (GOWN DISPOSABLE) ×9 IMPLANT
NEEDLE MAYO CATGUT SZ4 (NEEDLE) ×3 IMPLANT
PACK VAGINAL MINOR WOMEN LF (CUSTOM PROCEDURE TRAY) ×3 IMPLANT
PAD OB MATERNITY 4.3X12.25 (PERSONAL CARE ITEMS) ×3 IMPLANT
PAD PREP 24X48 CUFFED NSTRL (MISCELLANEOUS) ×3 IMPLANT
SUT MERSILENE 5MM BP 1 12 (SUTURE) ×6 IMPLANT
SYR BULB IRRIGATION 50ML (SYRINGE) ×3 IMPLANT
TOWEL OR 17X24 6PK STRL BLUE (TOWEL DISPOSABLE) ×6 IMPLANT
TRAY FOLEY CATH SILVER 14FR (SET/KITS/TRAYS/PACK) IMPLANT
TUBING NON-CON 1/4 X 20 CONN (TUBING) IMPLANT
TUBING NON-CON 1/4 X 20' CONN (TUBING)
WATER STERILE IRR 1000ML POUR (IV SOLUTION) ×3 IMPLANT
YANKAUER SUCT BULB TIP NO VENT (SUCTIONS) IMPLANT

## 2016-04-24 NOTE — Transfer of Care (Signed)
Immediate Anesthesia Transfer of Care Note  Patient: Hailey Walsh  Procedure(s) Performed: Procedure(s): CERCLAGE CERVICAL (N/A)  Patient Location: PACU  Anesthesia Type:Spinal  Level of Consciousness: awake, alert , oriented and patient cooperative  Airway & Oxygen Therapy: Patient Spontanous Breathing  Post-op Assessment: Report given to RN and Post -op Vital signs reviewed and stable  Post vital signs: Reviewed and stable  Last Vitals:  Filed Vitals:   04/24/16 1208  BP: 104/78  Pulse: 74  Temp: 36.9 C  Resp: 16    Last Pain: There were no vitals filed for this visit.    Patients Stated Pain Goal: 3 ([id number])  Complications: No apparent anesthesia complications

## 2016-04-24 NOTE — Anesthesia Preprocedure Evaluation (Signed)
Anesthesia Evaluation  Patient identified by MRN, date of birth, ID band Patient awake    Reviewed: Allergy & Precautions, H&P , NPO status , Patient's Chart, lab work & pertinent test results  Airway Mallampati: II  TM Distance: >3 FB Neck ROM: full    Dental no notable dental hx. (+) Teeth Intact, Dental Advisory Given   Pulmonary Current Smoker,    Pulmonary exam normal breath sounds clear to auscultation       Cardiovascular Exercise Tolerance: Good negative cardio ROS Normal cardiovascular exam Rhythm:regular Rate:Normal     Neuro/Psych negative neurological ROS  negative psych ROS   GI/Hepatic negative GI ROS, Neg liver ROS,   Endo/Other  negative endocrine ROS  Renal/GU negative Renal ROS  negative genitourinary   Musculoskeletal   Abdominal Normal abdominal exam  (+)   Peds  Hematology negative hematology ROS (+)   Anesthesia Other Findings   Reproductive/Obstetrics (+) Pregnancy                             Anesthesia Physical Anesthesia Plan  ASA: II  Anesthesia Plan: Spinal   Post-op Pain Management:    Induction:   Airway Management Planned:   Additional Equipment:   Intra-op Plan:   Post-operative Plan:   Informed Consent: I have reviewed the patients History and Physical, chart, labs and discussed the procedure including the risks, benefits and alternatives for the proposed anesthesia with the patient or authorized representative who has indicated his/her understanding and acceptance.   Dental Advisory Given  Plan Discussed with: CRNA  Anesthesia Plan Comments:         Anesthesia Quick Evaluation

## 2016-04-24 NOTE — Anesthesia Procedure Notes (Signed)
Spinal Patient location during procedure: OR Start time: 04/24/2016 12:58 PM End time: 04/24/2016 1:00 PM Staffing Anesthesiologist: Lyn Hollingshead Performed by: anesthesiologist  Preanesthetic Checklist Completed: patient identified, surgical consent, pre-op evaluation, timeout performed, IV checked, risks and benefits discussed and monitors and equipment checked Spinal Block Patient position: sitting Prep: DuraPrep Patient monitoring: heart rate, cardiac monitor, continuous pulse ox and blood pressure Approach: midline Location: L3-4 Injection technique: single-shot Needle Needle type: Sprotte  Needle gauge: 24 G Needle length: 9 cm Needle insertion depth: 5 cm Assessment Sensory level: T12

## 2016-04-24 NOTE — Progress Notes (Signed)
Used Microsoft, Administrator, sports in [place].

## 2016-04-24 NOTE — Op Note (Signed)
Preoperative diagnosis: incompetent cervix   Postoperative diagnosis: Same  Procedure: Cervical cerclage  Surgeon: Darron Doom, M.D.  Anesthesia: Spinal Lyn Hollingshead, MD  Findings: cervix 1 cm dilated  Estimated blood loss: 150  Specimens: None  Reason for procedure: Hailey Walsh [id number] [redacted]w[redacted]d, with h/o PPROM and loss at 15 wks then rescue cerclage last pregnancy for repeat cerclage.  Procedure: Patient was taken to the operating room where spinal analgesia was administered. She was prepped and draped in the usual sterile fashion.  A Foley catheter is used to drain her bladder. A timeout was performed. The patient had SCDs in place. . The patient was in dorsal lithotomy.  A weighted speculum was placed inside the vagina.  A Deaver was used anteriorly. The cervix was grasped with an open ring  forcep. A 5 mm Mersilene band on a cutting needle was used and to put in a pursestring suture. This was started at 12:00 and exiting at 9:00, starting at 9:00 and exiting at 6:00, starting at 6:00 and exiting at 3:00, starting at 3:00 and exiting at 12:00. A suture was then tied down. All instrument, needle and lap counts were correct x 2. The patient was taken to recovery in stable condition.  Hailey Walsh SMD 04/24/2016 1:28 PM

## 2016-04-24 NOTE — Discharge Instructions (Signed)
Cerclaje cervical (Cervical Cerclage) El cerclaje cervical es un procedimiento quirrgico para solucionar un cuello del tero incompetente. Un cuello del tero incompetente es un cuello dbil que se abre antes de que comience el trabajo de [place]. El cerclaje cervical es un procedimiento en el que se sutura el cuello del tero para cerrarlo durante el [place].  INFORME A SU MDICO:   Cualquier alergia que tenga.  Todos los Lyondell [employer], incluidos vitaminas, hierbas, gotas oftlmicas, cremas y medicamentos de venta libre.  Problemas previos que usted o los [employer]Health de su familia hayan tenido con el uso de anestsicos.  Enfermedades de [employer].  Cirugas previas.  Afecciones mdicas que tenga.  Resfros o infecciones recientes. RIESGOS Y COMPLICACIONES  En general, se trata de un procedimiento seguro. Sin embargo, Engineer, technical sales, pueden surgir problemas. Estos son algunos posibles problemas:  Infeccin.  Hemorragias.  Ruptura del saco amnitico ([place]).  Comenzar el [place] de parto y el parto de forma prematura.  Problemas con la anestesia.  Infeccin del saco amnitico. ANTES DEL PROCEDIMIENTO   Consulte a su mdico si debe cambiar o suspender sus medicamentos.  No coma ni beba nada durante las 6 a 8horas previas al procedimiento.  Pdale a alguna persona que la lleve a su casa luego del procedimiento. PROCEDIMIENTO   Se le colocar una sonda intravenosa en una de las venas. Le darn un sedante para que pueda relajarse.  Le aplicarn un medicamento que la har dormir durante el procedimiento (anestesia general) o le inyectarn un medicamento para adormecer la zona de la cintura para abajo (anestesia espinal o anestesia epidural). Usted dormir o tendr el cuerpo adormecido durante todo el procedimiento.  Le colocarn un espculo en la vagina para visualizar el cuello del tero.  Luego el cuello del tero se toma y se sutura de [place]  apretada.  Podrn utilizar una ecografa para guiar el procedimiento y Chief Technology Officer al beb. DESPUS DEL PROCEDIMIENTO   La llevarn a una sala de recuperacin y controlarn al beb que an no ha nacido. Una vez que despierte, se encuentre estabilizada y pueda ingerir lquidos sin problemas, podr volver a su habitacin.  [place] seismologist hospital durante la noche.  Le aplicarn una inyeccin de progesterona para evitar las contracciones uterinas.  Le darn analgsicos para que tome en su casa.  Pdale a alguna persona que la lleve de vuelta a su casa y que permanezca con usted durante 2 das.   Esta informacin no tiene Marine scientist el consejo del mdico. Asegrese de hacerle al mdico cualquier pregunta que tenga.   Document Released: 01/11/2009 Document Revised: 10/20/2013 Elsevier Interactive Patient Education 2016 [place]  (Pelvic Rest) El reposo plvico se recomienda a las mujeres cuando:   La placenta cubre parcial o completamente la abertura del cuello del tero (placenta previa).  Hay sangrado entre la pared del tero y el saco amnitico en el primer trimestre (hemorragia subcorinica).  El cuello uterino comienza a abrirse sin iniciarse el trabajo de parto (cuello uterino incompetente, insuficiencia cervical).  El [place] de parto se inicia muy pronto (parto prematuro). INSTRUCCIONES PARA EL CUIDADO EN EL HOGAR   No tenga relaciones sexuales, estimulacin, ni orgasmos.  No use tampones, no se haga duchas vaginales ni coloque ningn objeto en la vagina.  No levante objetos que pesen ms de 10 libras (4,5 kg).  Evite las actividades extenuantes o tensionar los msculos de la pelvis. SOLICITE ATENCIN MDICA SI:  Tiene un sangrado vaginal Solicitor. Considrelo como una posible emergencia.  Siente clicos en la zona baja del estmago (ms fuertes que los clicos menstruales).  Nota flujo vaginal (acuoso, con moco o  [place]).  Siente un dolor en la espalda leve y sordo.  Tiene contracciones regulares o endurecimiento del tero. SOLICITE ATENCIN MDICA DE INMEDIATO SI:  Observa sangrado vaginal y tiene placenta previa.    Esta informacin no tiene Marine scientist el consejo del mdico. Asegrese de hacerle al mdico cualquier pregunta que tenga.   Document Released: 07/09/2012 Elsevier Interactive Patient Education [employer].

## 2016-04-24 NOTE — Anesthesia Postprocedure Evaluation (Signed)
Anesthesia Post Note  Patient: Hailey Walsh  Procedure(s) Performed: Procedure(s) (LRB): CERCLAGE CERVICAL (N/A)  Patient location during evaluation: PACU Anesthesia Type: Spinal Level of consciousness: awake Pain management: pain level controlled Vital Signs Assessment: post-procedure vital signs reviewed and stable Respiratory status: spontaneous breathing Cardiovascular status: stable Postop Assessment: no headache, no backache, spinal receding, patient able to bend at knees and no signs of nausea or vomiting Anesthetic complications: no     Last Vitals:  Filed Vitals:   04/24/16 1400 04/24/16 1415  BP: 114/73 107/69  Pulse: 67 69  Temp:    Resp: 13 15    Last Pain: There were no vitals filed for this visit. Pain Goal: Patients Stated Pain Goal: 3 (04/24/16 1400)               [name] JR,JOHN Mateo Flow

## 2016-04-24 NOTE — H&P (Signed)
Hailey Walsh is an 35 y.o. G85P1020 female.   Chief Complaint: incompetent cervix HPI: H/o prior 15 wk loss following PPROM. Then had rescue cerclage followed by FTD. Here for prophylactic cerclage.  Past Medical History  Diagnosis Date  . Incompetent cervix 2011  . DVT (deep venous thrombosis) ([place]) 11/2014    right lower leg  . SVD (spontaneous vaginal delivery)     x 1  . Anxiety     patient denies anxiety  . Depression     no meds currently    Past Surgical History  Procedure Laterality Date  . Cervical cerclage  10/16/2010  . Abdominal surgery    . Orif wrist fracture Left 12/17/2014    Procedure: OPEN REDUCTION INTERNAL FIXATION (ORIF) WRIST FRACTURE;  Surgeon: Roseanne Kaufman, MD;  Location: [facility];  Service: Orthopedics;  Laterality: Left;  . Sacro-iliac pinning Left 12/17/2014    Procedure: Dub Mikes;  Surgeon: Rozanna Box, MD;  Location: [facility];  Service: Orthopedics;  Laterality: Left;  . Hardware removal Right 05/17/2015    Procedure: HARDWARE REMOVAL RIGHT ILIAC SCREW;  Surgeon: Altamese Fort Bliss, MD;  Location: [facility];  Service: Orthopedics;  Laterality: Right;    History reviewed. No pertinent family history. Social History:  reports that she quit smoking about 5 months ago. Her smoking use included Cigarettes. She has never used smokeless tobacco. She reports that she does not drink alcohol or use illicit drugs.  Allergies: No Known Allergies  No current facility-administered medications on file prior to encounter.   Current Outpatient Prescriptions on File Prior to Encounter  Medication Sig Dispense Refill  . acetaminophen (TYLENOL) 500 MG tablet Take 500 mg by mouth every 6 (six) hours as needed for mild pain.    . Menthol-Methyl Salicylate (MUSCLE RUB) 10-15 % CREA Apply 1 application topically 2 (two) times daily as needed for muscle pain. Reported on 04/11/2016    . cyclobenzaprine (FLEXERIL) 10 MG tablet Take 1 tablet (10 mg total) by mouth 3  (three) times daily as needed for muscle spasms. (Patient not taking: Reported on 04/11/2016) 30 tablet 0  . ketorolac (TORADOL) 10 MG tablet Take 1 tablet (10 mg total) by mouth every 6 (six) hours as needed for moderate pain. (Patient not taking: Reported on 04/11/2016) 20 tablet 0  . metoCLOPramide (REGLAN) 10 MG tablet Take 1 tablet (10 mg total) by mouth every 8 (eight) hours as needed for nausea or vomiting. (Patient not taking: Reported on 04/11/2016) 60 tablet 1  . nitrofurantoin, macrocrystal-monohydrate, (MACROBID) 100 MG capsule Take 1 capsule (100 mg total) by mouth 2 (two) times daily. 6 capsule 0  . norethindrone (MICRONOR,CAMILA,ERRIN) 0.35 MG tablet Take 1 tablet by mouth daily. Reported on 04/11/2016    . oxyCODONE-acetaminophen (PERCOCET/ROXICET) 5-325 MG per tablet Take 1-2 tablets by mouth every 6 (six) hours as needed for severe pain. (Patient not taking: Reported on 04/11/2016) 30 tablet 0    A comprehensive review of systems was negative.  Blood pressure 104/78, pulse 74, temperature 98.4 F (36.9 C), temperature source Oral, resp. rate 16, height 5' (1.524 m), weight 166 lb (75.297 kg), last menstrual period 12/09/2015, SpO2 100 %. BP 104/78 mmHg  Pulse 74  Temp(Src) 98.4 F (36.9 C) (Oral)  Resp 16  Ht 5' (1.524 m)  Wt 166 lb (75.297 kg)  BMI 32.42 kg/m2  SpO2 100%  LMP 12/09/2015 (Approximate) General appearance: alert, cooperative and appears stated age Head: Normocephalic, without obvious abnormality, atraumatic Neck: supple, symmetrical, trachea  midline Lungs: normal effort Heart: regular rate and rhythm Abdomen: soft, non-tender; bowel sounds normal; no masses,  no organomegaly Extremities: extremities normal, atraumatic, no cyanosis or edema Pulses: 2+ and symmetric Skin: Skin color, texture, turgor normal. No rashes or lesions Neurologic: Grossly normal   Lab Results  Component Value Date   WBC 8.8 04/11/2016   HGB 12.9 04/11/2016   HCT 37.5 04/11/2016    MCV 94.0 04/11/2016   PLT 221 04/11/2016   Lab Results  Component Value Date   PREGTESTUR Negative 05/24/2015   PREGSERUM POSITIVE* 04/11/2016   HCG <5.0 12/16/2014     Assessment/Plan Principal Problem:   Incompetent cervix in pregnancy, antepartum  For cervical cerclage. Risks include but are not limited to bleeding, infection, injury to surrounding structures, including bowel, bladder and ureters, blood clots, and death.  Likelihood of success is high.    Hailey Walsh S 04/24/2016, 12:45 PM

## 2016-04-24 NOTE — Progress Notes (Signed)
Fetal Heart Rate in SS 155

## 2016-04-25 ENCOUNTER — Encounter (HOSPITAL_COMMUNITY): Payer: Self-pay | Admitting: Family Medicine

## 2016-04-25 LAB — FACTOR 5 LEIDEN

## 2016-05-04 ENCOUNTER — Encounter: Payer: Self-pay | Admitting: *Deleted

## 2016-05-08 ENCOUNTER — Encounter: Payer: Self-pay | Admitting: Advanced Practice Midwife

## 2016-05-08 ENCOUNTER — Ambulatory Visit (INDEPENDENT_AMBULATORY_CARE_PROVIDER_SITE_OTHER): Payer: Self-pay | Admitting: Clinical

## 2016-05-08 ENCOUNTER — Ambulatory Visit (INDEPENDENT_AMBULATORY_CARE_PROVIDER_SITE_OTHER): Payer: Self-pay | Admitting: Advanced Practice Midwife

## 2016-05-08 VITALS — BP 104/67 | HR 77 | Wt 166.0 lb

## 2016-05-08 DIAGNOSIS — O09893 Supervision of other high risk pregnancies, third trimester: Secondary | ICD-10-CM | POA: Insufficient documentation

## 2016-05-08 DIAGNOSIS — O0992 Supervision of high risk pregnancy, unspecified, second trimester: Secondary | ICD-10-CM

## 2016-05-08 DIAGNOSIS — O3432 Maternal care for cervical incompetence, second trimester: Secondary | ICD-10-CM

## 2016-05-08 DIAGNOSIS — O09892 Supervision of other high risk pregnancies, second trimester: Secondary | ICD-10-CM

## 2016-05-08 DIAGNOSIS — O09299 Supervision of pregnancy with other poor reproductive or obstetric history, unspecified trimester: Secondary | ICD-10-CM | POA: Insufficient documentation

## 2016-05-08 DIAGNOSIS — Z8781 Personal history of (healed) traumatic fracture: Secondary | ICD-10-CM

## 2016-05-08 DIAGNOSIS — F4323 Adjustment disorder with mixed anxiety and depressed mood: Secondary | ICD-10-CM

## 2016-05-08 DIAGNOSIS — I824Y1 Acute embolism and thrombosis of unspecified deep veins of right proximal lower extremity: Secondary | ICD-10-CM

## 2016-05-08 DIAGNOSIS — Z113 Encounter for screening for infections with a predominantly sexual mode of transmission: Secondary | ICD-10-CM

## 2016-05-08 DIAGNOSIS — O09292 Supervision of pregnancy with other poor reproductive or obstetric history, second trimester: Secondary | ICD-10-CM

## 2016-05-08 LAB — POCT URINALYSIS DIP (DEVICE)
BILIRUBIN URINE: NEGATIVE
GLUCOSE, UA: NEGATIVE mg/dL
HGB URINE DIPSTICK: NEGATIVE
Ketones, ur: NEGATIVE mg/dL
NITRITE: NEGATIVE
Protein, ur: NEGATIVE mg/dL
Specific Gravity, Urine: 1.02 (ref 1.005–1.030)
Urobilinogen, UA: 0.2 mg/dL (ref 0.0–1.0)
pH: 7 (ref 5.0–8.0)

## 2016-05-08 NOTE — Progress Notes (Signed)
Dori used for interpreter for check in  Patient reports contractions x2 in a hour; also reports headaches, nausea, & SOB- feels like its related to her BP  Patient reports green d/c that comes and goes; currently has itch & odor

## 2016-05-08 NOTE — Progress Notes (Signed)
  ASSESSMENT: Pt currently experiencing Adjustment disorder with mixed anxiety and depressed mood.  Pt needs to f/u with OB and [facility]. Pt would benefit from psychoeducation and brief therapeutic interventions regarding coping with symptoms of anxiety and depression.  Stage of Change: contemplative  PLAN: 1. F/U with behavioral health clinician in one month, or as needed (will practice relaxation breathing techniques at next visit) 2. Psychiatric Medications: none 3. Behavioral recommendations:   -Watch favorite Trevor Mace daily  -Decide what kind of flower seeds to plant -After being medically cleared by OB, plant flower seeds outside -Read educational material regarding coping with symptoms of anxiety and depression  SUBJECTIVE: Pt. referred by Manya Silvas, CNM for symptoms of anxiety and depression Pt. reports the following symptoms/concerns: Pt states that she is feeling stress over uncertainties in current pregnancy. Pt says that she wants to go outside to plant flower seeds when she is medically cleared to do so; she enjoyed watching novellas to cope with stress.  Duration of problem: Over one month Severity: moderate   OBJECTIVE: Orientation & Cognition: Oriented x3. Thought processes normal and appropriate to situation. Mood: teary Affect: appropriate Appearance: appropriate Risk of harm to self or others: no known risk of harm to self or others Substance use: none Assessments administered: PHQ9: 10/ GAD7: 13  Diagnosis: Adjustment disorder with mixed anxiety and depressed mood CPT Code: F43.23  -------------------------------------------- Other(s) present in the room: Spanish-speaking interpreter  Time spent with patient in exam room: 40 minutes 3:50 -4:30pm  Depression screen [facility] 2/9 05/08/2016 04/11/2016 01/04/2015  Decreased Interest 1 0 0  Down, Depressed, Hopeless 1 0 0  PHQ - 2 Score 2 0 0  Altered sleeping 2 0 -  Tired, decreased energy 2 0 -  Change in appetite 2 0  -  Feeling bad or failure about yourself  1 0 -  Trouble concentrating 1 0 -  Moving slowly or fidgety/restless 0 0 -  Suicidal thoughts 0 0 -  PHQ-9 Score 10 0 -  Difficult doing work/chores - Not difficult at all -    GAD 7 : Generalized Anxiety Score 05/08/2016 04/11/2016  Nervous, Anxious, on Edge 1 0  Control/stop worrying 1 0  Worry too much - different things 2 0  Trouble relaxing 2 0  Restless 1 0  Easily annoyed or irritable 3 0  Afraid - awful might happen 3 0  Total GAD 7 Score 13 0  Anxiety Difficulty - Not difficult at all

## 2016-05-08 NOTE — Patient Instructions (Signed)
Insuficiencia cervical (Cervical Insufficiency)  Se llama insuficiencia cervical cuando el cuello del tero se debilita y comienza a abrirse (dilatarse) y a Electrical engineer ms delgado (borrarse) antes de que Water quality scientist llegue a trmino y sin que se inicie el trabajo de [place]. Tambin se llama crvix incompetente. Puede ocurrir en el segundo o tercer trimestre del [place], cuando el feto comienza a presionar sobre el cuello del tero. Puede ser causa de aborto, de rotura prematura de las membranas pretrmino (RPMP), o un nacimiento prematuro (nacimiento pretrmino).  FACTORES DE RIESGO  Tendr ms riesgo de sufrir insuficiencia cervical si:   El cuello del tero es ms corto que lo normal.  Ha sufrido un dao o lesin en el cuello del tero en un embarazo o ciruga anteriores.  Ha nacido con un defecto en el cuello del tero.  Le han realizado algn procedimiento en el cuello del tero, como una biopsia.  Tiene una historia de insuficiencia cervical.  Tiene una historia de RPMP.  Ha interrumpido varios embarazos anteriores con un aborto.  Ha estado expuesta a la droga dietiletilbestrol (DES). SNTOMAS  Muchas veces la mujer no presenta sntomas. Otras veces hay sntomas leves que generalmente comienzan entre la semana 14 y la 20 del Media planner. [place]. Estos sntomas son:   Jene Every o sangrado por la vagina.  Presin en la pelvis.  Un cambio en el aspecto en el flujo vaginal, como una secrecin que cambia de ser clara, blanca o amarillo claro a rosada o marrn.  Dolor de espalda.  Dolor o clicos abdominales. DIAGNSTICO  La insuficiencia cervical no puede diagnosticarse antes de quedar embarazada. Cuando est embarazada, el mdico le har una historia clnica y preguntar si ha sufrido problemas en embarazos anteriores. Informe a su mdico acerca de todo procedimiento que le hayan practicado en el cuello del tero, o si tiene historia de  abortos espontneos o de insuficiencia cervical. Si el mdico considera que tiene alto riesgo de sufrir insuficiencia cervical o presenta sntomas de insuficiencia cervical, podr indicarle:   Realizar un examen plvico Durante el mismo verificar:  Si hay membranas (saco amnitico) saliendo por el cuello del tero.  Anormalidades en el cuello del tero.  Lesiones en el cuello del tero.  La presencia de contracciones.  Realizar una ecografa para medir el largo y el grosor del cuello del tero. TRATAMIENTO  Si ha sido diagnosticada con insuficiencia cervical, el mdico podr indicar:   Limitar la actividad fsica.  Hacer reposo en cama, en su casa o en el hospital.  Hacer reposo plvico, lo que significa no tener relaciones sexuales no colocar nada dentro de la vagina.  Un cerclaje en el que se sutura el cuello del tero para cerrarlo e impedir que se abra anticipadamente. Los puntos (suturas) se retiran entre la semana 79 y la 48 para evitar problemas durante el [place] de [place]. El cerclaje puede recomendarse durante el embarazo si usted tiene una historia de abortos espontneos o partos prematuros sin causa aparente. Tambin puede indicarse si tiene un cuello uterino corto, diagnosticado con una ecografa o si el mdico ha hallado que su cuello se ha dilatado antes de las 24 semanas del [place]. Limitar la actividad fsica y Field seismologist reposo en cama puede ayudar o no a impedir un Environmental education officer.  [place]?  Solicite atencin mdica de inmediato si tiene sntomas de insuficiencia cervical. Debe concurrir al hospital para ser controlada inmediatamente.  Esta informacin no tiene Marine scientist el consejo del mdico. Asegrese de hacerle al mdico cualquier pregunta que tenga.   Document Released: 10/15/2005 Document Revised: 11/05/2014 Elsevier Interactive Patient Education [employer].

## 2016-05-09 ENCOUNTER — Encounter: Payer: Self-pay | Admitting: *Deleted

## 2016-05-09 LAB — GC/CHLAMYDIA PROBE AMP (~~LOC~~) NOT AT ARMC
Chlamydia: NEGATIVE
NEISSERIA GONORRHEA: NEGATIVE

## 2016-05-09 LAB — WET PREP, GENITAL
TRICH WET PREP: NONE SEEN
Yeast Wet Prep HPF POC: NONE SEEN

## 2016-05-10 ENCOUNTER — Telehealth: Payer: Self-pay | Admitting: General Practice

## 2016-05-10 DIAGNOSIS — B9689 Other specified bacterial agents as the cause of diseases classified elsewhere: Secondary | ICD-10-CM

## 2016-05-10 DIAGNOSIS — N760 Acute vaginitis: Principal | ICD-10-CM

## 2016-05-10 MED ORDER — METRONIDAZOLE 500 MG PO TABS: 500.0000 mg | ORAL_TABLET | Freq: Two times a day (BID) | ORAL | Status: DC

## 2016-05-10 NOTE — Telephone Encounter (Signed)
Per Manya Silvas, patient has BV and needs flagyl sent to pharmacy. Med ordered. Called patient with Laurene Footman for interpreter & informed her of results & medication sent to pharmacy. Patient verbalized understanding & had no questions

## 2016-05-12 NOTE — Progress Notes (Signed)
Subjective:  Hailey Walsh is a 35 y.o. G4P1021 at [redacted]w[redacted]d by 15.5 week [place] at HD being seen today for NOB transfer from [facility] for Hx DVT and Hx incompetent cervix w/ cerclage in place.  She is currently monitored for the following issues for this high-risk pregnancy and has History of DVT of lower extremity; Incompetent cervix in pregnancy, antepartum; Supervision of other high risk pregnancies, second trimester; History of cervical incompetence in pregnancy, currently pregnant; Cervical cerclage suture present in second trimester; and History of pelvic fracture on her problem list.   Records from HD reviewed. Had provoked DVT after Broken ankle and surgery. Coag panel only pos for Beta glycoprotein IgA. Also had pelvic Fx in 2016 after fall. Had hardware placed and removed later 2/2 pain.  Patient reports fatigue, headache, vaginal irritation and vaginal discharge. Denies VB.  Contractions: Irritability. Vag. Bleeding: None.  Movement: Absent. Denies leaking of fluid.   The following portions of the patient's history were reviewed and updated as appropriate: allergies, current medications, past family history, past medical history, past social history, past surgical history and problem list. Problem list updated.  Objective:   Filed Vitals:   05/08/16 1505  BP: 104/67  Pulse: 77  Weight: 166 lb (75.297 kg)    Fetal Status: Fetal Heart Rate (bpm): 156Fundal Ht @U . Movement: Absent Has not experiences quickening    General:  Alert, oriented and cooperative. Patient is in no acute distress.  Skin: Skin is warm and dry. No rash noted.   Cardiovascular: Normal heart rate noted  Respiratory: Normal respiratory effort, no problems with respiration noted  Abdomen: Soft, gravid, appropriate for gestational age. Pain/Pressure: Absent     Pelvic:  Cervical exam performed Dilation: Closed Effacement (%): Thick Station: Ballotable0/thick, cerclage visualized and palpated.  Spec exam: Neg pooling.  Moderate amount of creamy yellow-ish white, mildly malodorous discharge. No VB. No CMT.   Extremities: Normal range of motion.  Edema: None  Mental Status: Normal mood and affect. Normal behavior. Normal judgment and thought content.   Urinalysis: Urine Protein: Negative Urine Glucose: Negative  CL normal on [place] today.   Assessment and Plan:  Pregnancy: [id number] at [redacted]w[redacted]d  1. Supervision of high risk pregnancy in second trimester  - [place] MFM OB COMP + 14 WK; Future  Vaginal discharge pregnancy  - Wet prep, genital - GC/Chlamydia probe amp ([place])not at [facility]  2. Supervision of other high risk pregnancies, second trimester   3. History of cervical incompetence in pregnancy, currently pregnant, second trimester - F/U in 4 weeks per MFM  4. Cervical cerclage suture present in second trimester  5. Hx DVT - Lovenox not indicated per MFM  Preterm labor symptoms and general obstetric precautions including but not limited to vaginal bleeding, contractions, leaking of fluid and fetal movement were reviewed in detail with the patient. Please refer to After Visit Summary for other counseling recommendations.  Need to Discuss at [facility] how Hx pelvic Fx may impact delivery Return in about 4 weeks (around 06/05/2016).   Manya Silvas, CNM

## 2016-05-13 ENCOUNTER — Other Ambulatory Visit: Payer: Self-pay | Admitting: Advanced Practice Midwife

## 2016-05-13 ENCOUNTER — Encounter: Payer: Self-pay | Admitting: Advanced Practice Midwife

## 2016-05-13 DIAGNOSIS — Z8781 Personal history of (healed) traumatic fracture: Secondary | ICD-10-CM | POA: Insufficient documentation

## 2016-05-13 DIAGNOSIS — Z86718 Personal history of other venous thrombosis and embolism: Secondary | ICD-10-CM

## 2016-05-13 MED ORDER — ASPIRIN EC 81 MG PO TBEC: 81.0000 mg | DELAYED_RELEASE_TABLET | Freq: Every day | ORAL | Status: DC

## 2016-05-13 NOTE — Progress Notes (Signed)
MFM recommends starting Baby aspirin for Hx DVT and repeat B2 Glycoprotein at [place].

## 2016-05-14 ENCOUNTER — Other Ambulatory Visit: Payer: Self-pay | Admitting: Advanced Practice Midwife

## 2016-05-14 ENCOUNTER — Ambulatory Visit (HOSPITAL_COMMUNITY)
Admission: RE | Admit: 2016-05-14 | Discharge: 2016-05-14 | Disposition: A | Discharge status: Home / Self Care | Payer: Self-pay | Source: Ambulatory Visit | Attending: Advanced Practice Midwife | Admitting: Advanced Practice Midwife

## 2016-05-14 DIAGNOSIS — O3432 Maternal care for cervical incompetence, second trimester: Secondary | ICD-10-CM | POA: Insufficient documentation

## 2016-05-14 DIAGNOSIS — O09292 Supervision of pregnancy with other poor reproductive or obstetric history, second trimester: Secondary | ICD-10-CM | POA: Insufficient documentation

## 2016-05-14 DIAGNOSIS — O0992 Supervision of high risk pregnancy, unspecified, second trimester: Secondary | ICD-10-CM

## 2016-05-14 DIAGNOSIS — Z1389 Encounter for screening for other disorder: Secondary | ICD-10-CM

## 2016-05-14 DIAGNOSIS — Z3A18 18 weeks gestation of pregnancy: Secondary | ICD-10-CM

## 2016-05-14 DIAGNOSIS — Z3A19 19 weeks gestation of pregnancy: Secondary | ICD-10-CM | POA: Insufficient documentation

## 2016-05-14 DIAGNOSIS — Z36 Encounter for antenatal screening of mother: Secondary | ICD-10-CM | POA: Insufficient documentation

## 2016-05-28 NOTE — Progress Notes (Signed)
Called Hailey Walsh with Interpreter Lockie Mola. Notified her MFM reccommends baby aspirin daily due to hx DVT.  Notified her prescription was previously sent to [facility]. Notify us if any problems getting medication.

## 2016-05-28 NOTE — Progress Notes (Signed)
At end of conversation asked patient if she was having pain in either leg- she states she is having pain in her calves where she had clot before. Instructed patient to come to [facility]U for evaluation.

## 2016-05-28 NOTE — Progress Notes (Signed)
Patient c/o edema in feet, denies edema in hands or face, denies visual changes , states had had mild headaches relieved by tylenol. Instructed patient to come to MAU for severe heache not relieved by tylenol or if edema worsens or begins to have visual changes. Patient voices understanding.

## 2016-06-07 ENCOUNTER — Ambulatory Visit (INDEPENDENT_AMBULATORY_CARE_PROVIDER_SITE_OTHER): Payer: Self-pay | Admitting: Obstetrics & Gynecology

## 2016-06-07 VITALS — BP 126/73 | HR 82 | Wt 172.0 lb

## 2016-06-07 DIAGNOSIS — O3432 Maternal care for cervical incompetence, second trimester: Secondary | ICD-10-CM

## 2016-06-07 DIAGNOSIS — N883 Incompetence of cervix uteri: Secondary | ICD-10-CM

## 2016-06-07 DIAGNOSIS — O09892 Supervision of other high risk pregnancies, second trimester: Secondary | ICD-10-CM

## 2016-06-07 DIAGNOSIS — I824Y1 Acute embolism and thrombosis of unspecified deep veins of right proximal lower extremity: Secondary | ICD-10-CM

## 2016-06-07 DIAGNOSIS — O0992 Supervision of high risk pregnancy, unspecified, second trimester: Secondary | ICD-10-CM

## 2016-06-07 LAB — POCT URINALYSIS DIP (DEVICE)
Bilirubin Urine: NEGATIVE
Glucose, UA: NEGATIVE mg/dL
HGB URINE DIPSTICK: NEGATIVE
Ketones, ur: NEGATIVE mg/dL
LEUKOCYTES UA: NEGATIVE
NITRITE: NEGATIVE
PH: 7 (ref 5.0–8.0)
PROTEIN: NEGATIVE mg/dL
SPECIFIC GRAVITY, URINE: 1.015 (ref 1.005–1.030)
UROBILINOGEN UA: 0.2 mg/dL (ref 0.0–1.0)

## 2016-06-07 MED ORDER — METRONIDAZOLE 500 MG PO TABS: 500.0000 mg | ORAL_TABLET | Freq: Two times a day (BID) | ORAL | 0 refills | Status: DC

## 2016-06-07 NOTE — Addendum Note (Signed)
Addended by: Shelly Coss on: 06/07/2016 01:34 PM   Modules accepted: Orders

## 2016-06-07 NOTE — Progress Notes (Signed)
Video interpreter [phone] used for check in. Patient reports a lot of increased vaginal pressure and pain in the past two weeks. Patient states lately she doesn't feel well at all and feels like the baby is going to come soon. Patient states the pressure is so intense she feels like she constantly has to urinate Patient reports lower back pain

## 2016-06-07 NOTE — Progress Notes (Signed)
Subjective:  Hailey Walsh is a 35 y.o. G4P1021 at [redacted]w[redacted]d being seen today for ongoing prenatal care.  She is currently monitored for the following issues for this high-risk pregnancy and has History of DVT of lower extremity; Incompetent cervix in pregnancy, antepartum; Supervision of other high risk pregnancies, second trimester; History of cervical incompetence in pregnancy, currently pregnant; Cervical cerclage suture present in second trimester; and History of pelvic fracture on her problem list.  Patient reports lots of pelvic pressure, no bleeding, no ROM.Marland Kitchen  Contractions: Not present. Vag. Bleeding: None.  Movement: Present. Denies leaking of fluid.   The following portions of the patient's history were reviewed and updated as appropriate: allergies, current medications, past family history, past medical history, past social history, past surgical history and problem list. Problem list updated.  Objective:   Vitals:   06/07/16 1259  BP: 126/73  Pulse: 82  Weight: 172 lb (78 kg)    Fetal Status: Fetal Heart Rate (bpm): 155   Movement: Present     General:  Alert, oriented and cooperative. Patient is in no acute distress.  Skin: Skin is warm and dry. No rash noted.   Cardiovascular: Normal heart rate noted  Respiratory: Normal respiratory effort, no problems with respiration noted  Abdomen: Soft, gravid, appropriate for gestational age. Pain/Pressure: Present     Pelvic:  Cervical exam performed       frothy discharge c/w BV, Cerclage intact  Extremities: Normal range of motion.  Edema: Trace  Mental Status: Normal mood and affect. Normal behavior. Normal judgment and thought content.   Urinalysis: Urine Protein: Negative Urine Glucose: Negative  Assessment and Plan:  Pregnancy: G4P1021 at [redacted]w[redacted]d  1. Incompetence of cervix  - [place] MFM OB FOLLOW UP; Future  2. Supervision of other high risk pregnancies, second trimester - Continue pelvic rest - [place] for BV  3.  Supervision of high risk pregnancy in second trimester   4. Acute deep vein thrombosis (DVT) of proximal vein of right lower extremity (HCC)  - Beta-2-glycoprotein i abs, IgG/M/A  Preterm labor symptoms and general obstetric precautions including but not limited to vaginal bleeding, contractions, leaking of fluid and fetal movement were reviewed in detail with the patient. Please refer to After Visit Summary for other counseling recommendations.  No Follow-up on file.   Emily Filbert, MD

## 2016-06-08 LAB — BETA-2 GLYCOPROTEIN ANTIBODIES: BETA-2-GLYCOPROTEIN I IGA: 75 SAU — AB (ref <=20)

## 2016-06-19 ENCOUNTER — Ambulatory Visit (HOSPITAL_COMMUNITY)
Admission: RE | Admit: 2016-06-19 | Discharge: 2016-06-19 | Disposition: A | Discharge status: Home / Self Care | Payer: Self-pay | Source: Ambulatory Visit | Attending: Obstetrics & Gynecology | Admitting: Obstetrics & Gynecology

## 2016-06-19 ENCOUNTER — Other Ambulatory Visit: Payer: Self-pay | Admitting: Obstetrics & Gynecology

## 2016-06-19 ENCOUNTER — Encounter (HOSPITAL_COMMUNITY): Payer: Self-pay

## 2016-06-19 DIAGNOSIS — Z3A24 24 weeks gestation of pregnancy: Secondary | ICD-10-CM

## 2016-06-19 DIAGNOSIS — O3432 Maternal care for cervical incompetence, second trimester: Secondary | ICD-10-CM | POA: Insufficient documentation

## 2016-06-19 DIAGNOSIS — Z36 Encounter for antenatal screening of mother: Secondary | ICD-10-CM | POA: Insufficient documentation

## 2016-06-19 DIAGNOSIS — N883 Incompetence of cervix uteri: Secondary | ICD-10-CM

## 2016-06-19 DIAGNOSIS — Z0489 Encounter for examination and observation for other specified reasons: Secondary | ICD-10-CM

## 2016-06-19 DIAGNOSIS — IMO0002 Reserved for concepts with insufficient information to code with codable children: Secondary | ICD-10-CM

## 2016-06-27 ENCOUNTER — Ambulatory Visit (INDEPENDENT_AMBULATORY_CARE_PROVIDER_SITE_OTHER): Payer: Self-pay | Admitting: Certified Nurse Midwife

## 2016-06-27 ENCOUNTER — Telehealth: Payer: Self-pay | Admitting: General Practice

## 2016-06-27 VITALS — BP 126/73 | HR 76 | Wt 174.0 lb

## 2016-06-27 DIAGNOSIS — Z86718 Personal history of other venous thrombosis and embolism: Secondary | ICD-10-CM

## 2016-06-27 DIAGNOSIS — O09292 Supervision of pregnancy with other poor reproductive or obstetric history, second trimester: Secondary | ICD-10-CM

## 2016-06-27 DIAGNOSIS — O09892 Supervision of other high risk pregnancies, second trimester: Secondary | ICD-10-CM

## 2016-06-27 LAB — POCT URINALYSIS DIP (DEVICE)
Bilirubin Urine: NEGATIVE
GLUCOSE, UA: NEGATIVE mg/dL
HGB URINE DIPSTICK: NEGATIVE
KETONES UR: NEGATIVE mg/dL
Nitrite: NEGATIVE
Protein, ur: NEGATIVE mg/dL
SPECIFIC GRAVITY, URINE: 1.01 (ref 1.005–1.030)
Urobilinogen, UA: 0.2 mg/dL (ref 0.0–1.0)
pH: 7 (ref 5.0–8.0)

## 2016-06-27 LAB — FETAL FIBRONECTIN: Fetal Fibronectin: NEGATIVE

## 2016-06-27 MED ORDER — ENOXAPARIN SODIUM 40 MG/0.4ML ~~LOC~~ SOLN: 40.0000 mg | SUBCUTANEOUS | 2 refills | Status: DC

## 2016-06-27 NOTE — Addendum Note (Signed)
Addended by: Langston Reusing on: 06/27/2016 12:38 PM   Modules accepted: Orders

## 2016-06-27 NOTE — Progress Notes (Signed)
Video interpreter [phone] Patient reports a lot of pelvic pressure/pain; patient reports multiple ailments today

## 2016-06-27 NOTE — Progress Notes (Signed)
Subjective:  Hailey Walsh is a 35 y.o. G4P1021 at [redacted]w[redacted]d being seen today for ongoing prenatal care.  She is currently monitored for the following issues for this high-risk pregnancy and has History of DVT of lower extremity; Incompetent cervix in pregnancy, antepartum; Supervision of other high risk pregnancies, second trimester; History of cervical incompetence in pregnancy, currently pregnant; Cervical cerclage suture present in second trimester; and History of pelvic fracture on her problem list.  Patient reports pelvic pressure x3 weeks. She denies VB and LOF. She reports on average 2 ctx per day. She c/o pressure and frequency with urination and denies dysuria and hematuria. Maintaining pelvic rest. She also reports decreased FM x2 days. She has felt good FM since arrival today. .  Contractions: Not present. Vag. Bleeding: None.  Movement: (!) Decreased. Denies leaking of fluid.   The following portions of the patient's history were reviewed and updated as appropriate: allergies, current medications, past family history, past medical history, past social history, past surgical history and problem list. Problem list updated.  Objective:   Vitals:   06/27/16 1006  BP: 126/73  Pulse: 76  Weight: 174 lb (78.9 kg)    Fetal Status: Fetal Heart Rate (bpm): 153 Fundal Height: 26 cm Movement: (!) Decreased  Presentation: Undeterminable  General:  Alert, oriented and cooperative. Patient is in no acute distress.  Skin: Skin is warm and dry. No rash noted.   Cardiovascular: Normal heart rate noted  Respiratory: Normal respiratory effort, no problems with respiration noted  Abdomen: Soft, gravid, appropriate for gestational age. Pain/Pressure: Present     Pelvic: Vag. Bleeding: None Vag D/C Character: White   Cervical exam performed Dilation: Closed Effacement (%): Thick    Extremities: Normal range of motion.  Edema: Trace  Mental Status: Normal mood and affect. Normal behavior. Normal  judgment and thought content.   Urinalysis:      Assessment and Plan:  Pregnancy: [id number] at [redacted]w[redacted]d   Preterm labor symptoms and general obstetric precautions including but not limited to vaginal bleeding, contractions, leaking of fluid and fetal movement were reviewed in detail with the patient. Please refer to After Visit Summary for other counseling recommendations.  Return in about 2 weeks (around 07/11/2016).  1. History of DVT of lower extremity   2. Supervision of other high risk pregnancies, second trimester   3. History of cervical incompetence in pregnancy, currently pregnant, second trimester   4.      Decreased fetal movements -No evidence of PTL, cerclage intact, pressure likely physiologic to growing pregnancy, FFN, wet prep, and UC sent  -Audible accelaration on doppler, reported FM since arrival, FMCs discussed   Julianne Handler, CNM

## 2016-06-27 NOTE — Telephone Encounter (Signed)
At this time patient does not need Lovenox

## 2016-06-27 NOTE — Patient Instructions (Signed)
Contracciones de Physiological scientist (Braxton Hicks Contractions) Durante el [place], pueden presentarse contracciones uterinas que no siempre indican que est en [place].  QU SON LAS CONTRACCIONES DE BRAXTON HICKS?  Las [employer] se presentan antes del [place] de [place] se conocen como contracciones de [place] o falso trabajo de [place]. Hacia el final del embarazo (32 a 34semanas), estas contracciones pueden aparecen con ms frecuencia y volverse ms intensas. No corresponden al [place] de parto verdadero porque estas contracciones no producen el agrandamiento (la dilatacin) y el afinamiento del cuello del tero. Algunas veces, es difcil distinguirlas del trabajo de parto verdadero porque en algunos casos pueden ser [employer], y las personas tienen diferentes niveles de tolerancia al [employer]. No debe sentirse avergonzada si concurre al hospital con falso trabajo de [place]. En ocasiones, la nica forma de saber si el trabajo de parto es verdadero es que el mdico determine si hay cambios en el cuello del tero. Si no hay problemas prenatales u otras complicaciones de salud asociadas con el embarazo, no habr inconvenientes si la envan a su casa con falso trabajo de parto y espera que comience el verdadero. [place] DEL VERDADERO Falso trabajo de parto  Las contracciones del falso trabajo de parto duran menos y no son tan intensas como las verdaderas.  Generalmente son irregulares.  A menudo, se sienten en la parte delantera de la parte baja del abdomen y en la ingle,  y pueden desaparecer cuando camina o cambia de posicin mientras est acostada.  Las contracciones se vuelven ms dbiles y su duracin es Garment/textile technologist a medida que el tiempo transcurre.  Por lo general, no se hacen progresivamente ms intensas, regulares y Magazine features editor entre s como en el caso del [place] de parto verdadero. Antionette Fairy de parto  Las contracciones del verdadero  trabajo de parto duran de 48 a 70segundos, son muy regulares y suelen volverse ms intensas, y [place] su frecuencia.  No desaparecen cuando camina.  La molestia generalmente se siente en la parte superior del tero y se extiende hacia la zona inferior del abdomen y [employer] cintura.  El mdico podr examinarla para determinar si el trabajo de parto es verdadero. El examen mostrar si el cuello del tero se est dilatando y [place]. LO QUE DEBE RECORDAR  [place] haciendo los ejercicios habituales y siga otras indicaciones que el mdico le d.  Tome todos los medicamentos como le indic el mdico.  Consulting civil engineer a las visitas prenatales regulares.  Coma y beba con moderacin si cree que est en trabajo de parto.  Si las contracciones de [employer] provocan incomodidad:  Cambie de posicin: si est acostada o descansando, camine; si est caminando, descanse.  Sintese y descanse en una baera con agua tibia.  Beba 2 o 3vasos de [place]. La deshidratacin puede provocar contracciones.  Respire lenta y profundamente varias veces por hora. [place]? Solicite atencin mdica de inmediato si:  Las contracciones se intensifican, se hacen ms regulares y Industrial/product designer s.  Tiene una prdida de lquido por la vagina.  Tiene fiebre.  Elimina mucosidad manchada con [place].  Tiene una hemorragia vaginal abundante.  Tiene dolor abdominal permanente.  Tiene un dolor en la zona lumbar que nunca tuvo antes.  Siente que la cabeza del beb empuja hacia abajo y ejerce presin en la zona plvica.  El beb no se mueve Administrator, Civil Service.   Esta informacin no tiene como fin  reemplazar el consejo del mdico. Asegrese de hacerle al mdico cualquier pregunta que tenga.   Document Released: 07/25/2005 Document Revised: 10/20/2013 Elsevier Interactive Patient Education [employer].

## 2016-06-27 NOTE — Telephone Encounter (Signed)
Opened in error

## 2016-06-27 NOTE — Progress Notes (Signed)
Received FFN result from Nathaneil Canary, that her FFN was negative.  Notified provider no new orders.  [place] # [phone], LM that her lab from her today was normal and if she has any questions to please give the office a call.

## 2016-06-27 NOTE — Progress Notes (Signed)
Pt returned to office @ 1340 as requested. Using video interpreter [place] # [id number], I advised pt that she will not require Lovenox injections as previously prescribed earlier today. This recommendation was given by the specialist (MFM) doctor and reported to me by Julianne Handler, CNM. We apologize for any confusion or inconvenience which has been caused today. Pt should continue to take ASA 81 mg once daily and keep her next scheduled appt. Pt voiced understanding and all questions were answered to her satisfaction.

## 2016-06-27 NOTE — Care Management Note (Signed)
Case Manager notified by Clinic RN Morey Hummingbird of Pt's need for Lovenox.  Stated that Pt does not have insurance.  CM called Cleotis Nipper in financial counseling to check to see if Pt had applied for Medicaid or was even eligible.  Cleotis Nipper stated that the Pt was currently in her office.  Requested that Pt return to the Clinic once they have completed the financial counseling piece.  Cleotis Nipper stated that the Pt was only eligible for emergency Medicaid which would not cover her medications. CM spoke with Rober Minion about the [facility] for this Pt.  Pt is 25 4/[redacted] wks gestation and will need Lovenox for the duration of her pregnancy due to a history of DVT.  Laverta Baltimore will put an override in the [facility] system to allow the Pt to receive this medication - 90 day supply at a time.  CM completed the [facility] Letter.  CM gave Nita the attendings name - Dr. Harolyn Rutherford. CM spoke with [facility] about Lovenox teaching kit and they do have one in [place].  They will need a hand written Rx for that and they would bring it to the clinic.  This was completed and kit received.  Clinic RN has the kit. CM spoke with [facility], Rx for Lovenox would need to be filled at [facility] and she will call the Rx to [facility].  MATCH Letter complete. CM and Clinic RN spoke with the Pt via video interpreter - gave MATCH Letter and explained that she would need to give this Letter to the Pharmacy, she was also given the list of the Pharmacies with [place] highlighted and she will be able to find the address with her gps.  Pt will have to report back to the clinic today (once she picks her medication up from the Pharmacy) between 1:30 and 3:30 for instruction on how to administer the medication  Pt's questions answered regarding MATCH Letter. CM available to assist as needed.  Dicie Beam, [place]

## 2016-06-28 ENCOUNTER — Other Ambulatory Visit: Payer: Self-pay

## 2016-06-28 LAB — WET PREP, GENITAL: TRICH WET PREP: NONE SEEN

## 2016-06-28 MED ORDER — CLINDAMYCIN HCL 300 MG PO CAPS: 300.0000 mg | ORAL_CAPSULE | Freq: Two times a day (BID) | ORAL | 0 refills | Status: DC

## 2016-06-28 MED ORDER — FLUCONAZOLE 150 MG PO TABS: 150.0000 mg | ORAL_TABLET | Freq: Once | ORAL | 0 refills | Status: AC

## 2016-06-28 NOTE — Telephone Encounter (Signed)
Patient has a yeast infection and BV. Provider would like Diflucan 150 mg and Clindamycin 300 mg po bid x 7 days.

## 2016-06-29 ENCOUNTER — Other Ambulatory Visit: Payer: Self-pay

## 2016-06-29 MED ORDER — METRONIDAZOLE 500 MG PO TABS: 500.0000 mg | ORAL_TABLET | Freq: Two times a day (BID) | ORAL | 0 refills | Status: DC

## 2016-06-29 MED ORDER — CLINDAMYCIN HCL 300 MG PO CAPS: 300.0000 mg | ORAL_CAPSULE | Freq: Two times a day (BID) | ORAL | 0 refills | Status: DC

## 2016-06-29 MED FILL — CLINDAMYCIN HCL 300 MG CAPS: 300 | 7 days supply | Qty: 14 | Fill #0

## 2016-06-29 MED FILL — metroNIDAZOLE 500 MG TABS: 500 | 7 days supply | Qty: 14 | Fill #0

## 2016-06-29 NOTE — Telephone Encounter (Signed)
Patient has been informed of lab results and rx have been called into a cheaper pharmacy per patient request.

## 2016-07-13 ENCOUNTER — Ambulatory Visit (INDEPENDENT_AMBULATORY_CARE_PROVIDER_SITE_OTHER): Payer: Self-pay | Admitting: Family Medicine

## 2016-07-13 VITALS — BP 117/69 | HR 78 | Wt 177.5 lb

## 2016-07-13 DIAGNOSIS — Z86718 Personal history of other venous thrombosis and embolism: Secondary | ICD-10-CM

## 2016-07-13 DIAGNOSIS — O3432 Maternal care for cervical incompetence, second trimester: Secondary | ICD-10-CM

## 2016-07-13 DIAGNOSIS — O09892 Supervision of other high risk pregnancies, second trimester: Secondary | ICD-10-CM

## 2016-07-13 DIAGNOSIS — Z23 Encounter for immunization: Secondary | ICD-10-CM

## 2016-07-13 DIAGNOSIS — Z3493 Encounter for supervision of normal pregnancy, unspecified, third trimester: Secondary | ICD-10-CM

## 2016-07-13 DIAGNOSIS — O3433 Maternal care for cervical incompetence, third trimester: Secondary | ICD-10-CM

## 2016-07-13 LAB — CBC
HEMATOCRIT: 36.1 % (ref 35.0–45.0)
Hemoglobin: 12.2 g/dL (ref 11.7–15.5)
MCH: 31.4 pg (ref 27.0–33.0)
MCHC: 33.8 g/dL (ref 32.0–36.0)
MCV: 92.8 fL (ref 80.0–100.0)
MPV: 10.3 fL (ref 7.5–12.5)
PLATELETS: 183 10*3/uL (ref 140–400)
RBC: 3.89 MIL/uL (ref 3.80–5.10)
RDW: 13.3 % (ref 11.0–15.0)
WBC: 7.5 10*3/uL (ref 3.8–10.8)

## 2016-07-13 LAB — POCT URINALYSIS DIP (DEVICE)
Bilirubin Urine: NEGATIVE
Glucose, UA: NEGATIVE mg/dL
Hgb urine dipstick: NEGATIVE
Ketones, ur: NEGATIVE mg/dL
Nitrite: NEGATIVE
Protein, ur: NEGATIVE mg/dL
SPECIFIC GRAVITY, URINE: 1.015 (ref 1.005–1.030)
Urobilinogen, UA: 0.2 mg/dL (ref 0.0–1.0)
pH: 8.5 — ABNORMAL HIGH (ref 5.0–8.0)

## 2016-07-13 NOTE — Progress Notes (Signed)
   PRENATAL VISIT NOTE  Subjective:  Hailey Walsh is a 35 y.o. G4P1021 at [redacted]w[redacted]d being seen today for ongoing prenatal care.  She is currently monitored for the following issues for this high-risk pregnancy and has History of DVT of lower extremity; Incompetent cervix in pregnancy, antepartum; Supervision of other high risk pregnancies, second trimester; History of cervical incompetence in pregnancy, currently pregnant; and History of pelvic fracture on her problem list.  Patient reports backache and pelvic pressure.  Contractions: Not present. Vag. Bleeding: None.  Movement: Present. Denies leaking of fluid.   The following portions of the patient's history were reviewed and updated as appropriate: allergies, current medications, past family history, past medical history, past social history, past surgical history and problem list. Problem list updated.  Objective:   Vitals:   07/13/16 0903  BP: 117/69  Pulse: 78  Weight: 177 lb 8 oz (80.5 kg)    Fetal Status: Fetal Heart Rate (bpm): 153 Fundal Height: 29 cm Movement: Present     General:  Alert, oriented and cooperative. Patient is in no acute distress.  Skin: Skin is warm and dry. No rash noted.   Cardiovascular: Normal heart rate noted  Respiratory: Normal respiratory effort, no problems with respiration noted  Abdomen: Soft, gravid, appropriate for gestational age. Pain/Pressure: Present     Pelvic:  Cervical exam deferred        Extremities: Normal range of motion.  Edema: Trace  Mental Status: Normal mood and affect. Normal behavior. Normal judgment and thought content.   Urinalysis: Urine Protein: Negative Urine Glucose: Negative  Assessment and Plan:  Pregnancy: G4P1021 at [redacted]w[redacted]d  1. Need for Tdap vaccination  - Tdap vaccine greater than or equal to 7yo IM  2. Needs flu shot - Flu Vaccine QUAD 36+ mos IM (Fluarix, Quad PF)  3. Supervision of normal pregnancy in third trimester 28 wk labs - Glucose  Tolerance, 1 HR (50g) w/o Fasting - CBC - RPR - HIV antibody (with reflex)  4. Cervical cerclage suture present in second trimester Cerclage in place PTL s/sx's reviewed  5. History of DVT of lower extremity Re-check labs - Beta-2 glycoprotein antibodies  Preterm labor symptoms and general obstetric precautions including but not limited to vaginal bleeding, contractions, leaking of fluid and fetal movement were reviewed in detail with the patient. Please refer to After Visit Summary for other counseling recommendations.  Return in about 2 weeks (around 07/27/2016) for ob visit.  Donnamae Jude, MD

## 2016-07-13 NOTE — Patient Instructions (Addendum)
Tercer trimestre de Media planner (Third Trimester of Pregnancy) El tercer trimestre comprende desde la [id number] hasta la [id number], es decir, desde el mes7 hasta el mes9. El tercer trimestre es un perodo en el que el feto crece rpidamente. Hacia el final del noveno mes, el feto mide alrededor de 20pulgadas (45cm) de largo y pesa entre 6 y 67 libras (2,700 y 36,500kg).  CAMBIOS EN EL ORGANISMO Su organismo atraviesa por muchos cambios durante el [place], y estos varan de Ardelia Mems mujer a Theatre manager.   Seguir American Family Insurance. Es de esperar que aumente entre 25 y 35libras (3 y 16kg) hacia el final del Media planner.  Podrn aparecer las primeras Apache Corporation caderas, el abdomen y las [place].  Puede tener necesidad de Garment/textile technologist con ms frecuencia porque el feto baja hacia la pelvis y ejerce presin sobre la vejiga.  Debido al Glennis Brink podr sentir Victorio Palm estomacal con frecuencia.  Puede estar estreida, ya que ciertas hormonas enlentecen los movimientos de los msculos que JPMorgan Chase & Co desechos a travs de los intestinos.  Pueden aparecer hemorroides o abultarse e hincharse las venas (venas varicosas).  Puede sentir dolor plvico debido al Medtronic y a que las hormonas del Scientist, research (life sciences) las articulaciones entre los huesos de la pelvis. El dolor de espalda puede ser consecuencia de la sobrecarga de los msculos que soportan la [place].  Tal vez haya cambios en el cabello que pueden incluir su engrosamiento, crecimiento rpido y cambios en la textura. Adems, a algunas mujeres se les cae el cabello durante o despus del embarazo, o tienen el cabello seco o fino. Lo ms probable es que el cabello se le normalice despus del nacimiento del beb.  Las Lincoln National Corporation seguirn creciendo y Teaching laboratory technician. A veces, puede haber una secrecin amarilla de las mamas llamada calostro.  El ombligo puede salir hacia afuera.  Puede sentir que le falta el aire debido a que se expande el tero.  Puede notar que el feto  "baja" o lo siente ms bajo, en el abdomen.  Puede tener una prdida de secrecin mucosa con sangre. Esto suele ocurrir en el trmino de unos pocos das a una semana antes de que comience el [place] de [place].  El cuello del tero se vuelve delgado y blando (se borra) cerca de la fecha de [place]. QU DEBE ESPERAR EN LOS EXMENES PRENATALES  Le harn exmenes prenatales cada 2semanas hasta la semana36. A partir de ese momento le harn exmenes semanales. Durante una visita prenatal de rutina:  La pesarn para asegurarse de que usted y el feto estn creciendo normalmente.  Le tomarn la presin arterial.  Le medirn el abdomen para controlar el desarrollo del beb.  Se escucharn los latidos cardacos fetales.  Se evaluarn los resultados de los estudios solicitados en visitas anteriores.  Le revisarn el cuello del tero cuando est prxima la fecha de parto para controlar si este se ha borrado. Alrededor de la semana36, el mdico le revisar el cuello del tero. Al mismo tiempo, realizar un anlisis de las secreciones del tejido vaginal. Este examen es para determinar si hay un tipo de bacteria, estreptococo Grupo B. El mdico le explicar esto con ms detalle. El mdico puede preguntarle lo siguiente:  Cmo le gustara que fuera el [place].  Cmo se siente.  Si siente los movimientos del beb.  Si ha tenido sntomas anormales, como prdida de lquido, [place], dolores de cabeza intensos o clicos abdominales.  Si est consumiendo algn producto que contenga tabaco, como cigarrillos, tabaco  de Higher education careers adviser y Psychologist, sport and exercise.  Si tiene Sunoco. Otros exmenes o estudios de deteccin que pueden realizarse durante el tercer trimestre incluyen lo siguiente:  Anlisis de sangre para controlar los niveles de hierro (anemia).  Controles fetales para determinar su salud, nivel de [place] y Mining engineer. Si tiene [place] enfermedad o hay problemas durante el embarazo, le harn  estudios.  Prueba del VIH (virus de inmunodeficiencia humana). Si corre Electronics engineer, pueden realizarle una prueba de deteccin del VIH durante el tercer trimestre del embarazo. FALSO TRABAJO DE PARTO Es posible que sienta contracciones leves e irregulares que finalmente desaparecen. Se llaman contracciones de Braxton Hicks o falso trabajo de [place]. Las Yahoo pueden durar horas, das o incluso semanas, antes de que el verdadero trabajo de parto se inicie. Si las contracciones ocurren a intervalos regulares, se intensifican o se hacen dolorosas, lo mejor es que la revise el mdico.  SIGNOS DE TRABAJO DE PARTO   Clicos de tipo menstrual.  Contracciones cada 48mnutos o menos.  Contracciones que comienzan en la parte superior del tero y se extienden hacia abajo, a la zona inferior del abdomen y la espalda.  Sensacin de mayor presin en la pelvis o dolor de espalda.  Una secrecin de mucosidad acuosa o con sangre que sale de la vagina. Si tiene alguno de estos signos antes de la [id number] eMedia planner llame a su mdico de inmediato. Debe concurrir al hospital para que la controlen inmediatamente. INSTRUCCIONES PARA EL CUIDADO EN EL HOGAR   Evite fumar, consumir hierbas, beber alcohol y tomar frmacos que no le hayan recetado. Estas sustancias qumicas afectan la formacin y el desarrollo del beb.  No consuma ningn producto que contenga tabaco, lo que incluye cigarrillos, tabaco de mHigher education careers advisery cPsychologist, sport and exercise Si necesita ayuda para dejar de fumar, consulte al mMeadWestvaco Puede recibir asesoramiento y otro tipo de recursos para dejar de fumar.  SHardinsburgmdico en relacin con el uso de medicamentos. Durante el embarazo, hay medicamentos que son seguros de tomar y otros que no.  Haga ejercicio solamente como se lo haya indicado el mdico. Sentir clicos uterinos es un buen signo para dAmbulance personactividad fsica.  Contine comiendo alimentos sanos con  regularidad.  Use un sostn que le brinde buen soporte si le dNordstrom  No se d baos de inmersin en agua caliente, baos turcos ni saunas.  Use el cinturn de seguridad en todo momento mientras conduce.  No coma carne cruda ni queso sin cocinar; evite el contacto con las bandejas sanitarias de los gatos y la tierra que estos animales usan. Estos elementos contienen grmenes que pueden causar defectos congnitos en el beb.  TSebastian  Tome entre 1500 y 20017mde calcio diariamente comenzando en la [id number] embarazo haConvoy Si est estreida, pruebe un laxante suave (si el mdico lo autoriza). Consuma ms alimentos ricos en fibra, como vegetales y frutas frescos y cePsychologist, prison and probation servicesBeba gran cantidad de lquido para mantener la orina de tono claro o color amarillo plido.  Dese baos de asiento con agua tibia para alBest boy las molestias causadas por las hemorroides. Use una crema para las hemorroides si el mdico la autoriza.  Si tiene venas varicosas, use medias de descanso. Eleve los pies durante 1518mtos, 3 o 4veces por da. Limite el consumo de sal en su dieta.  Evite levantar objetos pesados, use zapatos de tacones bajos y manWestern SaharaDescanse  con las piernas elevadas si tiene calambres o dolor de cintura.  Visite a su dentista si no lo ha Quarry manager. Use un cepillo de dientes blando para higienizarse los dientes y psese el hilo dental con suavidad.  Puede seguir [employer] Power, a menos que el mdico le indique lo contrario.  No haga viajes largos excepto que sea absolutamente necesario y solo con la autorizacin del [place] clases prenatales para Development worker, international aid, Psychologist, prison and probation services y hacer preguntas sobre el [place] de parto y [place].  Haga un ensayo de la partida al hospital.  Prepare el bolso que llevar al hospital.  Prepare la habitacin del beb.  Concurra a todas  las visitas prenatales segn las indicaciones de su mdico. SOLICITE ATENCIN MDICA SI:  No est segura de que est en trabajo de parto o de que ha roto la bolsa de las aguas.  Tiene mareos.  Siente clicos leves, presin en la pelvis o dolor persistente en el abdomen.  Tiene nuseas, vmitos o diarrea persistentes.  Margette Fast secrecin vaginal con mal olor.  Siente dolor al Continental Airlines. SOLICITE ATENCIN MDICA DE INMEDIATO SI:   Tiene fiebre.  Tiene una prdida de lquido por la vagina.  Tiene sangrado o pequeas prdidas vaginales.  Siente dolor intenso o clicos en el abdomen.  Sube o baja de peso rpidamente.  Tiene dificultad para respirar y siente dolor de pecho.  Sbitamente se le hinchan mucho el rostro, las [place], los tobillos, los pies o las piernas.  No ha sentido los movimientos del beb durante Leone Brand.  Siente un dolor de cabeza intenso que no se alivia con medicamentos.  Su visin se modifica.   Esta informacin no tiene Marine scientist el consejo del mdico. Asegrese de hacerle al mdico cualquier pregunta que tenga.   Document Released: 07/25/2005 Document Revised: 11/05/2014 Elsevier Interactive Patient Education 2016 [place] (Breastfeeding) Decidir Economist es una de las mejores elecciones que puede hacer por usted y su beb. El cambio hormonal durante el Media planner produce el desarrollo del tejido mamario y [place] la cantidad y el tamao de los conductos galactforos. Estas hormonas tambin permiten que las protenas, los azcares y las grasas de la sangre produzcan la Northeast Utilities materna en las glndulas productoras de [place]. Las hormonas impiden que la leche materna sea liberada antes del nacimiento del beb, adems de impulsar el flujo de leche luego del nacimiento. Una vez que ha comenzado a Economist, Freight forwarder beb, as Therapist, occupational succin o Social research officer, government, pueden estimular la liberacin de [place] de las glndulas productoras de  [place].  LOS BENEFICIOS DE AMAMANTAR Para el beb  La primera leche (calostro) ayuda a Garment/textile technologist funcionamiento del sistema digestivo del beb.  La leche tiene anticuerpos que ayudan a Chemical engineer las infecciones en el beb.  El beb tiene una menor incidencia de asma, alergias y del sndrome de muerte sbita del lactante.  Los nutrientes en la [place] materna son mejores para el beb que la [place] maternizada y estn preparados exclusivamente para cubrir las necesidades del beb.  La leche materna mejora el desarrollo cerebral del beb.  Es menos probable que el beb desarrolle otras enfermedades, como obesidad infantil, asma o diabetes mellitus de tipo 2. Para usted   La lactancia materna favorece el desarrollo de un vnculo muy especial entre la madre y el beb.  Es conveniente. La leche materna siempre est disponible a la Tree surgeon y es [place].  La lactancia  materna ayuda a Medical illustrator y a perder el peso ganado durante el [place].  Favorece la contraccin del tero al tamao que tena antes del embarazo de manera ms rpida y disminuye el sangrado (loquios) despus del parto.  La lactancia materna contribuye a reducir Catering manager de desarrollar diabetes mellitus de tipo 2, osteoporosis o cncer de mama o de ovario en el futuro. SIGNOS DE QUE EL BEB EST HAMBRIENTO Primeros signos de hambre  Aumenta su estado de [place].  Se estira.  Mueve la cabeza de un lado a otro.  Mueve la cabeza y abre la boca cuando se le toca la mejilla o la comisura de la boca (reflejo de bsqueda).  [place] vocalizaciones, tales como sonidos de succin, se relame los labios, emite arrullos, suspiros, o chirridos.  Mueve la [employer] boca.  Se chupa con ganas los dedos o las manos. Signos tardos de Hartford Financial.  Llora de manera intermitente. Signos de BJ[employer] Wholesale signos de hambre extrema requerirn que lo calme y lo consuele antes de que el  beb pueda alimentarse adecuadamente. No espere a que se manifiesten los siguientes signos de hambre extrema para comenzar a Economist:   Air cabin crew.  Llanto intenso y fuerte.  Gritos. INFORMACIN BSICA SOBRE LA LACTANCIA MATERNA Iniciacin de la lactancia materna  Encuentre un lugar cmodo para sentarse o acostarse, con un buen respaldo para el cuello y la espalda.  Coloque una almohada o una manta enrollada debajo del beb para acomodarlo a la altura de la mama (si est sentada). Las almohadas para Economist se han diseado especialmente a fin de servir de apoyo para los brazos y el beb Kellogg.  Asegrese de que el abdomen del beb est frente al suyo.   Masajee suavemente la mama. Con las yemas de los dedos, masajee la pared del pecho hacia el pezn en un movimiento circular. Esto estimula el flujo de [place]. Es posible que Oceanographer este movimiento mientras amamanta si la leche fluye lentamente.  Sostenga la mama con el pulgar por arriba del pezn y los otros 4 dedos por debajo de la mama. Asegrese de que los dedos se encuentren lejos del pezn y de la boca del beb.  Empuje suavemente los labios del beb con el pezn o con el dedo.  Cuando la boca del beb se abra lo suficiente, acrquelo rpidamente a la mama e introduzca todo el pezn y la zona oscura que lo rodea (areola), tanto como sea posible, dentro de la boca del beb.  Debe haber ms areola visible por arriba del labio superior del beb que por debajo del labio inferior.  La lengua del beb debe estar entre la enca inferior y la [place].  Asegrese de que la boca del beb est en la posicin correcta alrededor del pezn (prendida). Los labios del beb deben crear un sello sobre la mama y estar doblados hacia afuera (invertidos).  Es comn que el beb succione durante 2 a 3 minutos para que comience el flujo de [place]. Cmo debe prenderse Es muy importante que le ensee al beb cmo prenderse  adecuadamente a la mama. Si el beb no se prende adecuadamente, puede causarle dolor en el pezn y reducir la produccin de [place], y hacer que el beb tenga un escaso aumento de [place]. Adems, si el beb no se prende adecuadamente al pezn, puede tragar aire durante la alimentacin. Esto puede causarle molestias al beb. Hacer eructar al beb al  cambiar de mama puede ayudarlo a Futures trader. Sin embargo, ensearle al beb cmo prenderse a la mama adecuadamente es la mejor manera de evitar que se sienta molesto por tragar Administrator, sports se alimenta. Signos de que el beb se ha prendido adecuadamente al pezn:   Hall Busing o succiona de modo silencioso, sin causarle dolor.  Se escucha que traga cada 3 o 4 succiones.  Hay movimientos musculares por arriba y por delante de sus odos al Mining engineer. Signos de que el beb no se ha prendido Product manager al pezn:   Hace ruidos de succin o de chasquido mientras se alimenta.  Siente dolor en el pezn. Si cree que el beb no se prendi correctamente, deslice el dedo en la comisura de la boca y Micron Technology las encas del beb para interrumpir la succin. Intente comenzar a amamantar nuevamente. Signos de Economist Signos del beb:   Disminuye gradualmente el nmero de succiones o cesa la succin por completo.  Se duerme.  Relaja el cuerpo.  Retiene una pequea cantidad de ALLTEL Corporation boca.  Se desprende solo del pecho. Signos que presenta usted:  Las mamas han aumentado la firmeza, el peso y el tamao 1 a 3 horas despus de Economist.  Estn ms blandas inmediatamente despus de amamantar.  Un aumento del volumen de [place], y tambin un cambio en su consistencia y color se producen hacia el [place] de Therapist, nutritional.  Los pezones no duelen, ni estn agrietados ni sangran. Signos de que su beb recibe la cantidad de leche suficiente  Moja al menos 3 paales en 24 horas. La orina debe ser clara y de color  amarillo plido a los [place].  Defeca al menos 3 veces en 24 horas a los 5 das de vida. La materia fecal debe ser blanda y [place].  Defeca al menos 3 veces en 24 horas a los 7 das de vida. La materia fecal debe ser grumosa y [place].  No registra una prdida de peso mayor del 10% del peso al nacer durante los primeros [place].  Aumenta de peso un promedio de 4 a 7onzas (113 a 198g) por semana despus de los [place].  Aumenta de [place], [place], de [place] uniforme a Proofreader de los 5 das de vida, sin Museum/gallery curator prdida de peso despus de las 2semanas de vida. Despus de alimentarse, es posible que el beb regurgite una pequea cantidad. Esto es frecuente. FRECUENCIA Y DURACIN DE LA LACTANCIA MATERNA El amamantamiento frecuente la ayudar a producir ms [place] y a Warden/ranger de Social research officer, government en los pezones e hinchazn en las [place]. Alimente al beb cuando muestre signos de hambre o si siente la necesidad de reducir la congestin de las [place]. Esto se denomina "lactancia a demanda". Evite el uso del chupete mientras trabaja para establecer la lactancia (las primeras 4 a 6 semanas despus del nacimiento del beb). Despus de este perodo, podr ofrecerle un chupete. Las investigaciones demostraron que el uso del chupete durante el primer ao de vida del beb disminuye el riesgo de desarrollar el sndrome de muerte sbita del lactante (SMSL). Permita que el nio se alimente en cada mama todo lo que desee. Contine amamantando al beb hasta que haya terminado de alimentarse. Cuando el beb se desprende o se queda dormido mientras se est alimentando de la primera mama, ofrzcale la segunda. Debido a que, con frecuencia, los recin Land O'Lakes las primeras semanas de vida, es posible  que deba despertar al beb para alimentarlo. Los horarios de Writer de un beb a otro. Sin embargo, las siguientes reglas pueden servir como gua para ayudarla a  Engineer, materials que el beb se alimenta adecuadamente:  Se puede amamantar a los recin nacidos (bebs de 4 semanas o menos de vida) cada 1 a 3 horas.  No deben transcurrir ms de 3 horas durante el da o 5 horas durante la noche sin que se amamante a los recin nacidos.  Debe amamantar al beb 8 veces como mnimo en un perodo de 24 horas, hasta que comience a introducir slidos en su dieta, a los 6 meses de vida aproximadamente. [place] extraccin y Recruitment consultant de la leche materna le permiten asegurarse de que el beb se alimente exclusivamente de [place], aun en momentos en los que no puede amamantar. Esto tiene especial importancia si debe regresar al [place] en el perodo en que an est amamantando o si no puede estar presente en los momentos en que el beb debe alimentarse. Su asesor en lactancia puede orientarla sobre cunto tiempo es [place].  El sacaleche es un aparato que le permite extraer leche de la mama a un recipiente estril. Luego, la leche materna extrada puede almacenarse en un refrigerador o Pension scheme manager. Algunos sacaleches son Theodore Demark, James Ivanoff otros son elctricos. Consulte a su asesor en lactancia qu tipo ser ms conveniente para usted. Los sacaleches se pueden comprar; sin embargo, algunos hospitales y grupos de apoyo a la lactancia materna alquilan Production assistant, radio. Un asesor en lactancia puede ensearle cmo extraer OfficeMax Incorporated, en caso de que prefiera no usar un sacaleche.  CMO CUIDAR LAS MAMAS DURANTE LA LACTANCIA MATERNA Los pezones se secan, agrietan y duelen durante la Therapist, nutritional. Las siguientes recomendaciones pueden ayudarla a Theatre manager las [employer] y sanas:  Art therapist usar jabn en los pezones.  Use un sostn de soporte. Aunque no son esenciales, las camisetas sin mangas o los sostenes especiales para Economist estn diseados para acceder fcilmente a las mamas, para Economist  sin tener que quitarse todo el sostn o la camiseta. Evite usar sostenes con aro o sostenes muy ajustados.  Seque al aire sus pezones durante 3 a 75minutos despus de amamantar al beb.  Utilice solo apsitos de Chiropodist sostn para Tax adviser las prdidas de [place]. La prdida de un poco de Owens Corning tomas es normal.  Utilice lanolina sobre los pezones luego de Economist. La lanolina ayuda a mantener la humedad normal de la piel. Si [place] lanolina pura, no tiene que lavarse los pezones antes de volver a Research scientist (life sciences) al beb. La lanolina pura no es txica para el beb. Adems, puede extraer Cisco algunas gotas de [place] materna y Community education officer suavemente esa [employer] Resources, para que la [place] se seque al aire. Durante las primeras semanas despus de dar a luz, algunas mujeres pueden experimentar hinchazn en las mamas (congestin [place]). La congestin puede hacer que sienta las mamas pesadas, calientes y sensibles al tacto. El pico de la congestin ocurre dentro de los 3 a 5 das despus del [place]. Las siguientes recomendaciones pueden ayudarla a Public house manager la congestin:  Vace por completo las mamas al [place]. Puede aplicar calor hmedo en las mamas (en la ducha o con toallas hmedas para manos) antes de Economist o extraer Northeast Utilities. Esto aumenta la circulacin y [place] a que la [place]. Si el beb no vaca por completo las  mamas cuando lo amamanta, extraiga la leche restante despus de que haya finalizado.  Use un sostn ajustado (para amamantar o comn) o una camiseta sin mangas durante 1 o 2 das para indicar al cuerpo que disminuya ligeramente la produccin de [place].  Aplique compresas de hielo E[employer], a menos que le resulte demasiado incmodo.  Asegrese de que el beb est prendido y se encuentre en la posicin correcta mientras lo alimenta. Si la congestin persiste luego de 48 horas o despus de seguir estas recomendaciones, comunquese con su  mdico o un Lobbyist. RECOMENDACIONES GENERALES PARA EL CUIDADO DE LA SALUD DURANTE LA LACTANCIA MATERNA  Consuma alimentos saludables. [place]ne comidas y colaciones, y coma 3 de cada una por da. Dado que lo que come Solectron Corporation, es posible que algunas comidas hagan que su beb se vuelva ms irritable de lo habitual. Evite comer este tipo de alimentos si percibe que afectan de manera negativa al beb.  Beba leche, jugos de fruta y agua para Engineer, water su sed (aproximadamente [place]).  Descanse con frecuencia, reljese y tome sus vitaminas prenatales para evitar la fatiga, el estrs y la anemia.  Contine con los autocontroles de la mama.  Evite Engineer, manufacturing systems y fumar tabaco. Las sustancias qumicas de los cigarrillos que pasan a la leche materna y la exposicin al humo ambiental del tabaco pueden daar al beb.  No consuma alcohol ni drogas, incluida la marihuana. Algunos medicamentos, que pueden ser perjudiciales para el beb, pueden pasar a travs de la SLM Corporation. Es importante que consulte a su mdico antes de Medical sales representative, incluidos todos los medicamentos recetados y de [place], as como los suplementos vitamnicos y herbales. Puede quedar embarazada durante la lactancia. Si desea controlar la natalidad, consulte a su mdico cules son las opciones ms seguras para el beb. SOLICITE ATENCIN MDICA SI:   Usted siente que quiere dejar de Economist o se siente frustrada con la lactancia.  Siente dolor en las mamas o en los pezones.  Sus pezones estn agrietados o Control and instrumentation engineer.  Sus pechos estn irritados, sensibles o calientes.  Tiene un rea hinchada en cualquiera de las mamas.  Siente escalofros o fiebre.  Tiene nuseas o vmitos.  Presenta una secrecin de otro lquido distinto de la leche materna de los pezones.  Sus mamas no se llenan antes de Economist al beb para el quinto da despus del [place].  Se siente triste y  deprimida.  El beb est demasiado somnoliento como para comer bien.  El beb tiene problemas para dormir.  Moja menos de 3 paales en 24 horas.  Defeca menos de 3 veces en 24 horas.  La piel del beb o la parte blanca de los ojos se vuelven amarillentas.  El beb no ha aumentado de [place] a los [place]. SOLICITE ATENCIN MDICA DE INMEDIATO SI:   El beb est muy cansado Engineer, manufacturing) y no se quiere despertar para comer.  Le sube la fiebre sin causa.   Esta informacin no tiene Marine scientist el consejo del mdico. Asegrese de hacerle al mdico cualquier pregunta que tenga.   Document Released: 10/15/2005 Document Revised: 07/06/2015 Elsevier Interactive Patient Education [employer].

## 2016-07-13 NOTE — Progress Notes (Signed)
28 week labs today.

## 2016-07-14 LAB — GLUCOSE TOLERANCE, 1 HOUR (50G) W/O FASTING: Glucose, 1 Hr, gestational: 122 mg/dL (ref <140)

## 2016-07-14 LAB — HIV ANTIBODY (ROUTINE TESTING W REFLEX): HIV 1&2 Ab, 4th Generation: NONREACTIVE

## 2016-07-14 LAB — RPR

## 2016-07-17 LAB — BETA-2 GLYCOPROTEIN ANTIBODIES: BETA-2-GLYCOPROTEIN I IGA: 76 SAU — AB (ref <=20)

## 2016-08-02 ENCOUNTER — Ambulatory Visit (INDEPENDENT_AMBULATORY_CARE_PROVIDER_SITE_OTHER): Payer: Self-pay | Admitting: Family

## 2016-08-02 VITALS — BP 126/75 | HR 82 | Wt 178.0 lb

## 2016-08-02 DIAGNOSIS — O09892 Supervision of other high risk pregnancies, second trimester: Secondary | ICD-10-CM

## 2016-08-02 DIAGNOSIS — Z8781 Personal history of (healed) traumatic fracture: Secondary | ICD-10-CM

## 2016-08-02 DIAGNOSIS — O09292 Supervision of pregnancy with other poor reproductive or obstetric history, second trimester: Secondary | ICD-10-CM

## 2016-08-02 DIAGNOSIS — Z86718 Personal history of other venous thrombosis and embolism: Secondary | ICD-10-CM

## 2016-08-02 DIAGNOSIS — O0993 Supervision of high risk pregnancy, unspecified, third trimester: Secondary | ICD-10-CM

## 2016-08-02 DIAGNOSIS — O09299 Supervision of pregnancy with other poor reproductive or obstetric history, unspecified trimester: Secondary | ICD-10-CM

## 2016-08-02 NOTE — Progress Notes (Signed)
   PRENATAL VISIT NOTE  Subjective:  Hailey Walsh is a 35 y.o. G4P1021 at [redacted]w[redacted]d being seen today for ongoing prenatal care.  She is currently monitored for the following issues for this high-risk pregnancy and has History of DVT of lower extremity; Supervision of other high risk pregnancies, second trimester; History of cervical incompetence in pregnancy, currently pregnant; and History of pelvic fracture on her problem list.  Patient reports backache.  Contractions: Not present. Vag. Bleeding: None.  Movement: Present. Denies leaking of fluid.   The following portions of the patient's history were reviewed and updated as appropriate: allergies, current medications, past family history, past medical history, past social history, past surgical history and problem list. Problem list updated.  Objective:   Vitals:   08/02/16 0813  BP: 126/75  Pulse: 82  Weight: 178 lb (80.7 kg)    Fetal Status: Fetal Heart Rate (bpm): 160 Fundal Height: 31 cm Movement: Present     General:  Alert, oriented and cooperative. Patient is in no acute distress.  Skin: Skin is warm and dry. No rash noted.   Cardiovascular: Normal heart rate noted  Respiratory: Normal respiratory effort, no problems with respiration noted  Abdomen: Soft, gravid, appropriate for gestational age. Pain/Pressure: Present     Pelvic:  Cervical exam deferred        Extremities: Normal range of motion.  Edema: Trace  Mental Status: Normal mood and affect. Normal behavior. Normal judgment and thought content.   Urinalysis:      Assessment and Plan:  Pregnancy: G4P1021 at [redacted]w[redacted]d  1. History of cervical incompetence in pregnancy, currently pregnant - Cerclage in place - Continue monitoring  2.  History of DVT of lower extremity - Monitoring glycoprotein  3. Supervision of other high risk pregnancies, second trimester - Reviewed third trimester labs   Preterm labor symptoms and general obstetric precautions including  but not limited to vaginal bleeding, contractions, leaking of fluid and fetal movement were reviewed in detail with the patient. Please refer to After Visit Summary for other counseling recommendations.  Return in 2 weeks (on 08/16/2016).  Venia Carbon Michiel Cowboy, CNM

## 2016-08-02 NOTE — Patient Instructions (Signed)
Tercer trimestre de Media planner (Third Trimester of Pregnancy) El tercer trimestre comprende desde la [id number] hasta la [id number], es decir, desde el mes7 hasta el mes9. El tercer trimestre es un perodo en el que el feto crece rpidamente. Hacia el final del noveno mes, el feto mide alrededor de 20pulgadas (45cm) de largo y pesa entre 6 y 67 libras (2,700 y 36,500kg).  CAMBIOS EN EL ORGANISMO Su organismo atraviesa por muchos cambios durante el [place], y estos varan de Ardelia Mems mujer a Theatre manager.   Seguir [employer]. Es de esperar que aumente entre 25 y 35libras (3 y 16kg) hacia el final del Media planner.  Podrn aparecer las primeras [employer] Corporation caderas, el abdomen y las [place].  Puede tener necesidad de Garment/textile technologist con ms frecuencia porque el feto baja hacia la pelvis y ejerce presin sobre la vejiga.  Debido al Glennis Brink podr sentir Victorio Palm estomacal con frecuencia.  Puede estar estreida, ya que ciertas hormonas enlentecen los movimientos de los msculos que [employer] desechos a travs de los intestinos.  Pueden aparecer hemorroides o abultarse e hincharse las venas (venas varicosas).  Puede sentir dolor plvico debido al [employer] y a que las hormonas del Scientist, research (life sciences) las articulaciones entre los huesos de la pelvis. El dolor de espalda puede ser consecuencia de la sobrecarga de los msculos que soportan la [place].  Tal vez haya cambios en el cabello que pueden incluir su engrosamiento, crecimiento rpido y cambios en la textura. Adems, a algunas mujeres se les cae el cabello durante o despus del embarazo, o tienen el cabello seco o fino. Lo ms probable es que el cabello se le normalice despus del nacimiento del beb.  Las [employer] seguirn creciendo y Teaching laboratory technician. A veces, puede haber una secrecin amarilla de las mamas llamada calostro.  El ombligo puede salir hacia afuera.  Puede sentir que le falta el aire debido a que se expande el tero.  Puede notar que el feto  "baja" o lo siente ms bajo, en el abdomen.  Puede tener una prdida de secrecin mucosa con sangre. Esto suele ocurrir en el trmino de unos pocos das a una semana antes de que comience el [place].  El cuello del tero se vuelve delgado y blando (se borra) cerca de la fecha de [place]. QU DEBE ESPERAR EN LOS EXMENES PRENATALES  Le harn exmenes prenatales cada 2semanas hasta la semana36. A partir de ese momento le harn exmenes semanales. Durante una visita prenatal de rutina:  La pesarn para asegurarse de que usted y el feto estn creciendo normalmente.  Le tomarn la presin arterial.  Le medirn el abdomen para controlar el desarrollo del beb.  Se escucharn los latidos cardacos fetales.  Se evaluarn los resultados de los estudios solicitados en visitas anteriores.  Le revisarn el cuello del tero cuando est prxima la fecha de parto para controlar si este se ha borrado. Alrededor de la semana36, el mdico le revisar el cuello del tero. Al mismo tiempo, realizar un anlisis de las secreciones del tejido vaginal. Este examen es para determinar si hay un tipo de bacteria, estreptococo Grupo B. El mdico le explicar esto con ms detalle. El mdico puede preguntarle lo siguiente:  Cmo le gustara que fuera el [place].  Cmo se siente.  Si siente los movimientos del beb.  Si ha tenido sntomas anormales, como prdida de lquido, [place], dolores de cabeza intensos o clicos abdominales.  Si est consumiendo algn producto que contenga tabaco, como cigarrillos, tabaco  de Higher education careers adviser y Psychologist, sport and exercise.  Si tiene [employer]. Otros exmenes o estudios de deteccin que pueden realizarse durante el tercer trimestre incluyen lo siguiente:  Anlisis de sangre para controlar los niveles de hierro (anemia).  Controles fetales para determinar su salud, nivel de [place] y Mining engineer. Si tiene [place] enfermedad o hay problemas durante el embarazo, le harn  estudios.  Prueba del VIH (virus de inmunodeficiencia humana). Si corre Electronics engineer, pueden realizarle una prueba de deteccin del VIH durante el tercer trimestre del embarazo. FALSO TRABAJO DE PARTO Es posible que sienta contracciones leves e irregulares que finalmente desaparecen. Se llaman contracciones de Braxton Hicks o falso trabajo de [place]. Las Yahoo pueden durar horas, das o incluso semanas, antes de que el verdadero trabajo de parto se inicie. Si las contracciones ocurren a intervalos regulares, se intensifican o se hacen dolorosas, lo mejor es que la revise el mdico.  SIGNOS DE TRABAJO DE PARTO   Clicos de tipo menstrual.  Contracciones cada 48mnutos o menos.  Contracciones que comienzan en la parte superior del tero y se extienden hacia abajo, a la zona inferior del abdomen y la espalda.  Sensacin de mayor presin en la pelvis o dolor de espalda.  Una secrecin de mucosidad acuosa o con sangre que sale de la vagina. Si tiene alguno de estos signos antes de la [id number] eMedia planner llame a su mdico de inmediato. Debe concurrir al hospital para que la controlen inmediatamente. INSTRUCCIONES PARA EL CUIDADO EN EL HOGAR   Evite fumar, consumir hierbas, beber alcohol y tomar frmacos que no le hayan recetado. Estas sustancias qumicas afectan la formacin y el desarrollo del beb.  No consuma ningn producto que contenga tabaco, lo que incluye cigarrillos, tabaco de mHigher education careers advisery cPsychologist, sport and exercise Si necesita ayuda para dejar de fumar, consulte al mMeadWestvaco Puede recibir asesoramiento y otro tipo de recursos para dejar de fumar.  SH[place]burgmdico en relacin con el uso de medicamentos. Durante el embarazo, hay medicamentos que son seguros de tomar y otros que no.  Haga ejercicio solamente como se lo haya indicado el mdico. Sentir clicos uterinos es un buen signo para dAmbulance personactividad fsica.  Contine comiendo alimentos sanos con  regularidad.  Use un sostn que le brinde buen soporte si le dNordstrom  No se d baos de inmersin en agua caliente, baos turcos ni saunas.  Use el cinturn de seguridad en todo momento mientras conduce.  No coma carne cruda ni queso sin cocinar; evite el contacto con las bandejas sanitarias de los gatos y la tierra que estos animales usan. Estos elementos contienen grmenes que pueden causar defectos congnitos en el beb.  TSebastian  Tome entre 1500 y 20017mde calcio diariamente comenzando en la [id number] embarazo haConvoy Si est estreida, pruebe un laxante suave (si el mdico lo autoriza). Consuma ms alimentos ricos en fibra, como vegetales y frutas frescos y cePsychologist, prison and probation servicesBeba gran cantidad de lquido para mantener la orina de tono claro o color amarillo plido.  Dese baos de asiento con agua tibia para alBest boy las molestias causadas por las hemorroides. Use una crema para las hemorroides si el mdico la autoriza.  Si tiene venas varicosas, use medias de descanso. Eleve los pies durante 1518mtos, 3 o 4veces por da. Limite el consumo de sal en su dieta.  Evite levantar objetos pesados, use zapatos de tacones bajos y manWestern SaharaDescanse  con las piernas elevadas si tiene calambres o dolor de cintura.  Visite a su dentista si no lo ha Quarry manager. Use un cepillo de dientes blando para higienizarse los dientes y psese el hilo dental con suavidad.  Puede seguir [employer], a menos que el mdico le indique lo contrario.  No haga viajes largos excepto que sea absolutamente necesario y solo con la autorizacin del [place] clases prenatales para Development worker, international aid, Psychologist, prison and probation services y hacer preguntas sobre el [place] de parto y [place].  Haga un ensayo de la partida al hospital.  Prepare el bolso que llevar al hospital.  Prepare la habitacin del beb.  Concurra a todas  las visitas prenatales segn las indicaciones de su mdico. SOLICITE ATENCIN MDICA SI:  No est segura de que est en trabajo de parto o de que ha roto la bolsa de las aguas.  Tiene mareos.  Siente clicos leves, presin en la pelvis o dolor persistente en el abdomen.  Tiene nuseas, vmitos o diarrea persistentes.  Margette Fast secrecin vaginal con mal olor.  Siente dolor al [employer]. SOLICITE ATENCIN MDICA DE INMEDIATO SI:   Tiene fiebre.  Tiene una prdida de lquido por la vagina.  Tiene sangrado o pequeas prdidas vaginales.  Siente dolor intenso o clicos en el abdomen.  Sube o baja de peso rpidamente.  Tiene dificultad para respirar y siente dolor de pecho.  Sbitamente se le hinchan mucho el rostro, las [place], los tobillos, los pies o las piernas.  No ha sentido los movimientos del beb durante Leone Brand.  Siente un dolor de cabeza intenso que no se alivia con medicamentos.  Su visin se modifica.   Esta informacin no tiene Marine scientist el consejo del mdico. Asegrese de hacerle al mdico cualquier pregunta que tenga.   Document Released: 07/25/2005 Document Revised: 11/05/2014 Elsevier Interactive Patient Education [employer].

## 2016-08-16 ENCOUNTER — Ambulatory Visit (INDEPENDENT_AMBULATORY_CARE_PROVIDER_SITE_OTHER): Payer: Self-pay | Admitting: Obstetrics and Gynecology

## 2016-08-16 ENCOUNTER — Ambulatory Visit: Payer: Self-pay

## 2016-08-16 VITALS — BP 120/69 | HR 74 | Wt 180.0 lb

## 2016-08-16 DIAGNOSIS — O09892 Supervision of other high risk pregnancies, second trimester: Secondary | ICD-10-CM

## 2016-08-16 DIAGNOSIS — Z86718 Personal history of other venous thrombosis and embolism: Secondary | ICD-10-CM

## 2016-08-16 DIAGNOSIS — O09299 Supervision of pregnancy with other poor reproductive or obstetric history, unspecified trimester: Secondary | ICD-10-CM

## 2016-08-16 DIAGNOSIS — Z8781 Personal history of (healed) traumatic fracture: Secondary | ICD-10-CM

## 2016-08-16 DIAGNOSIS — O09293 Supervision of pregnancy with other poor reproductive or obstetric history, third trimester: Secondary | ICD-10-CM

## 2016-08-16 NOTE — Addendum Note (Signed)
Addended by: Arlina Robes L on: 08/16/2016 10:00 AM   Modules accepted: Orders

## 2016-08-16 NOTE — Progress Notes (Signed)
Subjective:  Hailey Walsh is a 35 y.o. G4P1021 at [redacted]w[redacted]d being seen today for ongoing prenatal care.  She is currently monitored for the following issues for this high-risk pregnancy and has History of DVT of lower extremity; Supervision of other high risk pregnancies, second trimester; History of cervical incompetence in pregnancy, currently pregnant; and History of pelvic fracture on her problem list.  Patient reports backache.  Contractions: Not present. Vag. Bleeding: None.  Movement: Present. Denies leaking of fluid.   The following portions of the patient's history were reviewed and updated as appropriate: allergies, current medications, past family history, past medical history, past social history, past surgical history and problem list. Problem list updated.  Objective:   Vitals:   08/16/16 0904  BP: 120/69  Pulse: 74  Weight: 180 lb (81.6 kg)    Fetal Status: Fetal Heart Rate (bpm): 141   Movement: Present     General:  Alert, oriented and cooperative. Patient is in no acute distress.  Skin: Skin is warm and dry. No rash noted.   Cardiovascular: Normal heart rate noted  Respiratory: Normal respiratory effort, no problems with respiration noted  Abdomen: Soft, gravid, appropriate for gestational age. Pain/Pressure: Present     Pelvic:  Cervical exam deferred        Extremities: Normal range of motion.  Edema: Trace  Mental Status: Normal mood and affect. Normal behavior. Normal judgment and thought content.   Urinalysis:      Assessment and Plan:  Pregnancy: G4P1021 at [redacted]w[redacted]d  1. History of pelvic fracture Hardware removed. Discussed potential impact on delivery but would only proceed to c section for maternal or fetal indications  2. History of cervical incompetence in pregnancy, currently pregnant Stable, cerclage in place  3. Supervision of other high risk pregnancies, second trimester U/S for growth  4. History of DVT of lower extremity Beta  glycoproteins checked today. Continue with BASA qd  Preterm labor symptoms and general obstetric precautions including but not limited to vaginal bleeding, contractions, leaking of fluid and fetal movement were reviewed in detail with the patient. Please refer to After Visit Summary for other counseling recommendations.  No Follow-up on file.   Chancy Milroy, MD

## 2016-08-16 NOTE — Progress Notes (Signed)
Marly used for interpreter Patient needs to see Roselyn Reef today per depression/anxiety screen  Breastfeeding discussed with patient

## 2016-08-17 LAB — BETA-2 GLYCOPROTEIN ANTIBODIES
Beta-2 Glyco I IgG: <9 SGU (ref <=20)
Beta-2-Glycoprotein I IgA: 60 SAU — ABNORMAL HIGH (ref <=20)

## 2016-08-23 ENCOUNTER — Ambulatory Visit (HOSPITAL_COMMUNITY)
Admission: RE | Admit: 2016-08-23 | Discharge: 2016-08-23 | Disposition: A | Discharge status: Home / Self Care | Payer: Self-pay | Source: Ambulatory Visit | Attending: Obstetrics and Gynecology | Admitting: Obstetrics and Gynecology

## 2016-08-23 ENCOUNTER — Encounter (HOSPITAL_COMMUNITY): Payer: Self-pay

## 2016-08-23 ENCOUNTER — Other Ambulatory Visit: Payer: Self-pay | Admitting: Obstetrics and Gynecology

## 2016-08-23 DIAGNOSIS — Z3A33 33 weeks gestation of pregnancy: Secondary | ICD-10-CM

## 2016-08-23 DIAGNOSIS — O09293 Supervision of pregnancy with other poor reproductive or obstetric history, third trimester: Secondary | ICD-10-CM | POA: Insufficient documentation

## 2016-08-23 DIAGNOSIS — O2690 Pregnancy related conditions, unspecified, unspecified trimester: Secondary | ICD-10-CM

## 2016-08-23 DIAGNOSIS — O3433 Maternal care for cervical incompetence, third trimester: Secondary | ICD-10-CM

## 2016-08-23 DIAGNOSIS — O09892 Supervision of other high risk pregnancies, second trimester: Secondary | ICD-10-CM

## 2016-09-06 ENCOUNTER — Ambulatory Visit (INDEPENDENT_AMBULATORY_CARE_PROVIDER_SITE_OTHER): Payer: Self-pay | Admitting: Family Medicine

## 2016-09-06 ENCOUNTER — Ambulatory Visit (INDEPENDENT_AMBULATORY_CARE_PROVIDER_SITE_OTHER): Payer: Self-pay | Admitting: Clinical

## 2016-09-06 VITALS — BP 112/76 | HR 78 | Wt 180.7 lb

## 2016-09-06 DIAGNOSIS — O9989 Other specified diseases and conditions complicating pregnancy, childbirth and the puerperium: Secondary | ICD-10-CM

## 2016-09-06 DIAGNOSIS — O09293 Supervision of pregnancy with other poor reproductive or obstetric history, third trimester: Secondary | ICD-10-CM

## 2016-09-06 DIAGNOSIS — O99343 Other mental disorders complicating pregnancy, third trimester: Secondary | ICD-10-CM

## 2016-09-06 DIAGNOSIS — N898 Other specified noninflammatory disorders of vagina: Secondary | ICD-10-CM

## 2016-09-06 DIAGNOSIS — Z86718 Personal history of other venous thrombosis and embolism: Secondary | ICD-10-CM

## 2016-09-06 DIAGNOSIS — F4323 Adjustment disorder with mixed anxiety and depressed mood: Secondary | ICD-10-CM

## 2016-09-06 DIAGNOSIS — Z113 Encounter for screening for infections with a predominantly sexual mode of transmission: Secondary | ICD-10-CM

## 2016-09-06 DIAGNOSIS — O0993 Supervision of high risk pregnancy, unspecified, third trimester: Secondary | ICD-10-CM

## 2016-09-06 DIAGNOSIS — O09299 Supervision of pregnancy with other poor reproductive or obstetric history, unspecified trimester: Secondary | ICD-10-CM

## 2016-09-06 DIAGNOSIS — F4321 Adjustment disorder with depressed mood: Secondary | ICD-10-CM

## 2016-09-06 LAB — POCT URINALYSIS DIP (DEVICE)
Bilirubin Urine: NEGATIVE
Glucose, UA: NEGATIVE mg/dL
HGB URINE DIPSTICK: NEGATIVE
Ketones, ur: NEGATIVE mg/dL
Leukocytes, UA: NEGATIVE
NITRITE: NEGATIVE
PH: 7 (ref 5.0–8.0)
Protein, ur: NEGATIVE mg/dL
UROBILINOGEN UA: 0.2 mg/dL (ref 0.0–1.0)

## 2016-09-06 LAB — OB RESULTS CONSOLE GBS: GBS: NEGATIVE

## 2016-09-06 LAB — OB RESULTS CONSOLE GC/CHLAMYDIA: Gonorrhea: NEGATIVE

## 2016-09-06 NOTE — BH Specialist Note (Signed)
Session Start time: 10:15   End Time: 11:00 Total Time:  45 minutes Type of Service: [place] Interpreter: Yes.     Interpreter Name & Language: Sheliah Mends # [facility] Visits July 2017-June 2018: 2nd   SUBJECTIVE: Hailey Walsh is a 35 y.o. female  Pt. was referred by Darron Doom, MD for:  anxiety. Pt. reports the following symptoms/concerns: Pt states that her primary concern today is feeling anxious over her 23yo daughter's reaction to the pregnancy. Duration of problem:  Over one month Severity: mild Previous treatment: none   OBJECTIVE: Mood: Anxious & Affect: Tearful Risk of harm to self or others: No known risk of harm to self or others Assessments administered: PHQ9: 8/ GAD7: 11  LIFE CONTEXT:  Family & Social: Lives with husband and 3yo daughter  Life changes: Current pregnancy, 3yo daughter reaction to pregnancy What is important to pt/family (values): Family   GOALS ADDRESSED:  -Reduce symptoms of anxiety  INTERVENTIONS: Strength-based, Counsellor and Supportive   ASSESSMENT:  Pt currently experiencing Adjustment disorder with anxious mood.  Pt may benefit from brief therapeutic interventions regarding coping with anxiety.      PLAN: 1. F/U with behavioral health clinician: As needed 2. Behavioral Health meds: none 3. Behavioral recommendations:  -Consider putting in place routine individual time with  daughter prior to birth of newborn daughter -Consider importance of self-regulation of emotions -Consider [facility] the [place] for family counseling, if 35yo continues to have issues with the new baby 4. Referral: Brief Counseling/Psychotherapy and Community Resource   [place] Clinician  Geraldine Contras: no  Depression screen [facility] 2/9 09/06/2016 08/16/2016 07/13/2016 06/27/2016 05/08/2016  Decreased Interest 1 1 2 1 1   Down, Depressed, Hopeless 0 0 1 1 1   PHQ - 2 Score 1 1 3  2 2   Altered sleeping 1 2 2 2 2   Tired, decreased energy 2 2 3 2 2   Change in appetite 1 2 2 1 2   Feeling bad or failure about yourself  1 0 1 1 1   Trouble concentrating 1 1 2 1 1   Moving slowly or fidgety/restless 1 0 0 1 0  Suicidal thoughts 0 0 0 0 0  PHQ-9 Score 8 8 13 10 10   Difficult doing work/chores - - - - -   GAD 7 : Generalized Anxiety Score 09/06/2016 08/16/2016 07/13/2016 06/27/2016  Nervous, Anxious, on Edge 1 1 2 1   Control/stop worrying 1 2 1 1   Worry too much - different things 2 2 2 1   Trouble relaxing 2 2 2 2   Restless 1 2 2 1   Easily annoyed or irritable 2 2 3 2   Afraid - awful might happen 2 1 3 3   Total GAD 7 Score 11 12 15 11   Anxiety Difficulty - - - -

## 2016-09-06 NOTE — Progress Notes (Signed)
Used interpreter Elinor Parkinson. C/o whitish, sometime greenish vaginal discharge. C/o thirsty- craving water. C/o nausea at times.

## 2016-09-06 NOTE — Patient Instructions (Signed)
Tercer trimestre de Media planner (Third Trimester of Pregnancy) El tercer trimestre comprende desde la [id number] hasta la [id number], es decir, desde el mes7 hasta el mes9. El tercer trimestre es un perodo en el que el feto crece rpidamente. Hacia el final del noveno mes, el feto mide alrededor de 20pulgadas (45cm) de largo y pesa entre 6 y 67 libras (2,700 y 36,500kg).  CAMBIOS EN EL ORGANISMO Su organismo atraviesa por muchos cambios durante el [place], y estos varan de Hailey Walsh mujer a Hailey Walsh.   Seguir American Family Insurance. Es de esperar que aumente entre 25 y 35libras (3 y 16kg) hacia el final del Media planner.  Podrn aparecer las primeras Apache Corporation caderas, el abdomen y las [place].  Puede tener necesidad de Garment/textile technologist con ms frecuencia porque el feto baja hacia la pelvis y ejerce presin sobre la vejiga.  Debido al Glennis Brink podr sentir Victorio Palm estomacal con frecuencia.  Puede estar estreida, ya que ciertas hormonas enlentecen los movimientos de los msculos que JPMorgan Chase & Co desechos a travs de los intestinos.  Pueden aparecer hemorroides o abultarse e hincharse las venas (venas varicosas).  Puede sentir dolor plvico debido al Medtronic y a que las hormonas del Scientist, research (life sciences) las articulaciones entre los huesos de la pelvis. El dolor de espalda puede ser consecuencia de la sobrecarga de los msculos que soportan la [place].  Tal vez haya cambios en el cabello que pueden incluir su engrosamiento, crecimiento rpido y cambios en la textura. Adems, a algunas mujeres se les cae el cabello durante o despus del embarazo, o tienen el cabello seco o fino. Lo ms probable es que el cabello se le normalice despus del nacimiento del beb.  Las Lincoln National Corporation seguirn creciendo y Teaching laboratory technician. A veces, puede haber una secrecin amarilla de las mamas llamada calostro.  El ombligo puede salir hacia afuera.  Puede sentir que le falta el aire debido a que se expande el tero.  Puede notar que el feto  "baja" o lo siente ms bajo, en el abdomen.  Puede tener una prdida de secrecin mucosa con sangre. Esto suele ocurrir en el trmino de unos pocos das a una semana antes de que comience el [place] de [place].  El cuello del tero se vuelve delgado y blando (se borra) cerca de la fecha de [place]. QU DEBE ESPERAR EN LOS EXMENES PRENATALES  Le harn exmenes prenatales cada 2semanas hasta la semana36. A partir de ese momento le harn exmenes semanales. Durante una visita prenatal de rutina:  La pesarn para asegurarse de que usted y el feto estn creciendo normalmente.  Le tomarn la presin arterial.  Le medirn el abdomen para controlar el desarrollo del beb.  Se escucharn los latidos cardacos fetales.  Se evaluarn los resultados de los estudios solicitados en visitas anteriores.  Le revisarn el cuello del tero cuando est prxima la fecha de parto para controlar si este se ha borrado. Alrededor de la semana36, el mdico le revisar el cuello del tero. Al mismo tiempo, realizar un anlisis de las secreciones del tejido vaginal. Este examen es para determinar si hay un tipo de bacteria, estreptococo Grupo B. El mdico le explicar esto con ms detalle. El mdico puede preguntarle lo siguiente:  Cmo le gustara que fuera el [place].  Cmo se siente.  Si siente los movimientos del beb.  Si ha tenido sntomas anormales, como prdida de lquido, [place], dolores de cabeza intensos o clicos abdominales.  Si est consumiendo algn producto que contenga tabaco, como cigarrillos, tabaco  de Higher education careers adviser y Psychologist, sport and exercise.  Si tiene Sunoco. Otros exmenes o estudios de deteccin que pueden realizarse durante el tercer trimestre incluyen lo siguiente:  Anlisis de sangre para controlar los niveles de hierro (anemia).  Controles fetales para determinar su salud, nivel de [place] y Mining engineer. Si tiene [place] enfermedad o hay problemas durante el embarazo, le harn  estudios.  Prueba del VIH (virus de inmunodeficiencia humana). Si corre Electronics engineer, pueden realizarle una prueba de deteccin del VIH durante el tercer trimestre del embarazo. FALSO TRABAJO DE PARTO Es posible que sienta contracciones leves e irregulares que finalmente desaparecen. Se llaman contracciones de Braxton Hicks o falso trabajo de [place]. Las Yahoo pueden durar horas, das o incluso semanas, antes de que el verdadero trabajo de parto se inicie. Si las contracciones ocurren a intervalos regulares, se intensifican o se hacen dolorosas, lo mejor es que la revise el mdico.  SIGNOS DE TRABAJO DE PARTO   Clicos de tipo menstrual.  Contracciones cada 48mnutos o menos.  Contracciones que comienzan en la parte superior del tero y se extienden hacia abajo, a la zona inferior del abdomen y la espalda.  Sensacin de mayor presin en la pelvis o dolor de espalda.  Una secrecin de mucosidad acuosa o con sangre que sale de la vagina. Si tiene alguno de estos signos antes de la [id number] eMedia planner llame a su mdico de inmediato. Debe concurrir al hospital para que la controlen inmediatamente. INSTRUCCIONES PARA EL CUIDADO EN EL HOGAR   Evite fumar, consumir hierbas, beber alcohol y tomar frmacos que no le hayan recetado. Estas sustancias qumicas afectan la formacin y el desarrollo del beb.  No consuma ningn producto que contenga tabaco, lo que incluye cigarrillos, tabaco de mHigher education careers advisery cPsychologist, sport and exercise Si necesita ayuda para dejar de fumar, consulte al mMeadWestvaco Puede recibir asesoramiento y otro tipo de recursos para dejar de fumar.  SHardinsburgmdico en relacin con el uso de medicamentos. Durante el embarazo, hay medicamentos que son seguros de tomar y otros que no.  Haga ejercicio solamente como se lo haya indicado el mdico. Sentir clicos uterinos es un buen signo para dAmbulance personactividad fsica.  Contine comiendo alimentos sanos con  regularidad.  Use un sostn que le brinde buen soporte si le dNordstrom  No se d baos de inmersin en agua caliente, baos turcos ni saunas.  Use el cinturn de seguridad en todo momento mientras conduce.  No coma carne cruda ni queso sin cocinar; evite el contacto con las bandejas sanitarias de los gatos y la tierra que estos animales usan. Estos elementos contienen grmenes que pueden causar defectos congnitos en el beb.  TSebastian  Tome entre 1500 y 20017mde calcio diariamente comenzando en la [id number] embarazo haConvoy Si est estreida, pruebe un laxante suave (si el mdico lo autoriza). Consuma ms alimentos ricos en fibra, como vegetales y frutas frescos y cePsychologist, prison and probation servicesBeba gran cantidad de lquido para mantener la orina de tono claro o color amarillo plido.  Dese baos de asiento con agua tibia para alBest boy las molestias causadas por las hemorroides. Use una crema para las hemorroides si el mdico la autoriza.  Si tiene venas varicosas, use medias de descanso. Eleve los pies durante 1518mtos, 3 o 4veces por da. Limite el consumo de sal en su dieta.  Evite levantar objetos pesados, use zapatos de tacones bajos y manWestern SaharaDescanse  con las piernas elevadas si tiene calambres o dolor de cintura.  Visite a su dentista si no lo ha Quarry Walsh. Use un cepillo de dientes blando para higienizarse los dientes y psese el hilo dental con suavidad.  Puede seguir [employer] Power, a menos que el mdico le indique lo contrario.  No haga viajes largos excepto que sea absolutamente necesario y solo con la autorizacin del [place] clases prenatales para Development worker, international aid, Psychologist, prison and probation services y hacer preguntas sobre el [place] de parto y [place].  Haga un ensayo de la partida al hospital.  Prepare el bolso que llevar al hospital.  Prepare la habitacin del beb.  Concurra a todas  las visitas prenatales segn las indicaciones de su mdico. SOLICITE ATENCIN MDICA SI:  No est segura de que est en trabajo de parto o de que ha roto la bolsa de las aguas.  Tiene mareos.  Siente clicos leves, presin en la pelvis o dolor persistente en el abdomen.  Tiene nuseas, vmitos o diarrea persistentes.  Margette Fast secrecin vaginal con mal olor.  Siente dolor al Continental Airlines. SOLICITE ATENCIN MDICA DE INMEDIATO SI:   Tiene fiebre.  Tiene una prdida de lquido por la vagina.  Tiene sangrado o pequeas prdidas vaginales.  Siente dolor intenso o clicos en el abdomen.  Sube o baja de peso rpidamente.  Tiene dificultad para respirar y siente dolor de pecho.  Sbitamente se le hinchan mucho el rostro, las [place], los tobillos, los pies o las piernas.  No ha sentido los movimientos del beb durante Leone Brand.  Siente un dolor de cabeza intenso que no se alivia con medicamentos.  Su visin se modifica.   Esta informacin no tiene Marine scientist el consejo del mdico. Asegrese de hacerle al mdico cualquier pregunta que tenga.   Document Released: 07/25/2005 Document Revised: 11/05/2014 Elsevier Interactive Patient Education 2016 [place] (Breastfeeding) Decidir Economist es una de las mejores elecciones que puede hacer por usted y su beb. El cambio hormonal durante el Media planner produce el desarrollo del tejido mamario y [place] la cantidad y el tamao de los conductos galactforos. Estas hormonas tambin permiten que las protenas, los azcares y las grasas de la sangre produzcan la Northeast Utilities materna en las glndulas productoras de [place]. Las hormonas impiden que la leche materna sea liberada antes del nacimiento del beb, adems de impulsar el flujo de leche luego del nacimiento. Una vez que ha comenzado a Economist, Freight forwarder beb, as Therapist, occupational succin o Social research officer, government, pueden estimular la liberacin de [place] de las glndulas productoras de  [place].  LOS BENEFICIOS DE AMAMANTAR Para el beb  La primera leche (calostro) ayuda a Garment/textile technologist funcionamiento del sistema digestivo del beb.  La leche tiene anticuerpos que ayudan a Chemical engineer las infecciones en el beb.  El beb tiene una menor incidencia de asma, alergias y del sndrome de muerte sbita del lactante.  Los nutrientes en la [place] materna son mejores para el beb que la [place] maternizada y estn preparados exclusivamente para cubrir las necesidades del beb.  La leche materna mejora el desarrollo cerebral del beb.  Es menos probable que el beb desarrolle otras enfermedades, como obesidad infantil, asma o diabetes mellitus de tipo 2. Para usted   La lactancia materna favorece el desarrollo de un vnculo muy especial entre la madre y el beb.  Es conveniente. La leche materna siempre est disponible a la Tree surgeon y es [place].  La lactancia  materna ayuda a Medical illustrator y a perder el peso ganado durante el [place].  Favorece la contraccin del tero al tamao que tena antes del embarazo de manera ms rpida y disminuye el sangrado (loquios) despus del parto.  La lactancia materna contribuye a reducir Catering Walsh de desarrollar diabetes mellitus de tipo 2, osteoporosis o cncer de mama o de ovario en el futuro. SIGNOS DE QUE EL BEB EST HAMBRIENTO Primeros signos de hambre  Aumenta su estado de [place].  Se estira.  Mueve la cabeza de un lado a otro.  Mueve la cabeza y abre la boca cuando se le toca la mejilla o la comisura de la boca (reflejo de bsqueda).  [place] vocalizaciones, tales como sonidos de succin, se relame los labios, emite arrullos, suspiros, o chirridos.  Mueve la [employer] boca.  Se chupa con ganas los dedos o las manos. Signos tardos de Hartford Financial.  Llora de manera intermitente. Signos de BJ[employer] Wholesale signos de hambre extrema requerirn que lo calme y lo consuele antes de que el  beb pueda alimentarse adecuadamente. No espere a que se manifiesten los siguientes signos de hambre extrema para comenzar a Economist:   Air cabin crew.  Llanto intenso y fuerte.  Gritos. INFORMACIN BSICA SOBRE LA LACTANCIA MATERNA Iniciacin de la lactancia materna  Encuentre un lugar cmodo para sentarse o acostarse, con un buen respaldo para el cuello y la espalda.  Coloque una almohada o una manta enrollada debajo del beb para acomodarlo a la altura de la mama (si est sentada). Las almohadas para Economist se han diseado especialmente a fin de servir de apoyo para los brazos y el beb Kellogg.  Asegrese de que el abdomen del beb est frente al suyo.   Masajee suavemente la mama. Con las yemas de los dedos, masajee la pared del pecho hacia el pezn en un movimiento circular. Esto estimula el flujo de [place]. Es posible que Oceanographer este movimiento mientras amamanta si la leche fluye lentamente.  Sostenga la mama con el pulgar por arriba del pezn y los otros 4 dedos por debajo de la mama. Asegrese de que los dedos se encuentren lejos del pezn y de la boca del beb.  Empuje suavemente los labios del beb con el pezn o con el dedo.  Cuando la boca del beb se abra lo suficiente, acrquelo rpidamente a la mama e introduzca todo el pezn y la zona oscura que lo rodea (areola), tanto como sea posible, dentro de la boca del beb.  Debe haber ms areola visible por arriba del labio superior del beb que por debajo del labio inferior.  La lengua del beb debe estar entre la enca inferior y la [place].  Asegrese de que la boca del beb est en la posicin correcta alrededor del pezn (prendida). Los labios del beb deben crear un sello sobre la mama y estar doblados hacia afuera (invertidos).  Es comn que el beb succione durante 2 a 3 minutos para que comience el flujo de [place]. Cmo debe prenderse Es muy importante que le ensee al beb cmo prenderse  adecuadamente a la mama. Si el beb no se prende adecuadamente, puede causarle dolor en el pezn y reducir la produccin de Wagon Wheel, y hacer que el beb tenga un escaso aumento de [place]. Adems, si el beb no se prende adecuadamente al pezn, puede tragar aire durante la alimentacin. Esto puede causarle molestias al beb. Hacer eructar al beb al  cambiar de mama puede ayudarlo a Futures trader. Sin embargo, ensearle al beb cmo prenderse a la mama adecuadamente es la mejor manera de evitar que se sienta molesto por tragar Administrator, sports se alimenta. Signos de que el beb se ha prendido adecuadamente al pezn:   Hall Busing o succiona de modo silencioso, sin causarle dolor.  Se escucha que traga cada 3 o 4 succiones.  Hay movimientos musculares por arriba y por delante de sus odos al Mining engineer. Signos de que el beb no se ha prendido Product Walsh al pezn:   Hace ruidos de succin o de chasquido mientras se alimenta.  Siente dolor en el pezn. Si cree que el beb no se prendi correctamente, deslice el dedo en la comisura de la boca y Micron Technology las encas del beb para interrumpir la succin. Intente comenzar a amamantar nuevamente. Signos de Economist Signos del beb:   Disminuye gradualmente el nmero de succiones o cesa la succin por completo.  Se duerme.  Relaja el cuerpo.  Retiene una pequea cantidad de ALLTEL Corporation boca.  Se desprende solo del pecho. Signos que presenta usted:  Las mamas han aumentado la firmeza, el peso y el tamao 1 a 3 horas despus de Economist.  Estn ms blandas inmediatamente despus de amamantar.  Un aumento del volumen de [place], y tambin un cambio en su consistencia y color se producen hacia el Albertson de Therapist, nutritional.  Los pezones no duelen, ni estn agrietados ni sangran. Signos de que su beb recibe la cantidad de leche suficiente  Moja al menos 3 paales en 24 horas. La orina debe ser clara y de color  amarillo plido a los [place].  Defeca al menos 3 veces en 24 horas a los 5 das de vida. La materia fecal debe ser blanda y [place].  Defeca al menos 3 veces en 24 horas a los 7 das de vida. La materia fecal debe ser grumosa y [place].  No registra una prdida de peso mayor del 10% del peso al nacer durante los primeros [place].  Aumenta de peso un promedio de 4 a 7onzas (113 a 198g) por semana despus de los [place].  Aumenta de [place], [place], de [place] uniforme a Proofreader de los 5 das de vida, sin Museum/gallery curator prdida de peso despus de las 2semanas de vida. Despus de alimentarse, es posible que el beb regurgite una pequea cantidad. Esto es frecuente. FRECUENCIA Y DURACIN DE LA LACTANCIA MATERNA El amamantamiento frecuente la ayudar a producir ms [place] y a Warden/ranger de Social research officer, government en los pezones e hinchazn en las [place]. Alimente al beb cuando muestre signos de hambre o si siente la necesidad de reducir la congestin de las [place]. Esto se denomina "lactancia a demanda". Evite el uso del chupete mientras trabaja para establecer la lactancia (las primeras 4 a 6 semanas despus del nacimiento del beb). Despus de este perodo, podr ofrecerle un chupete. Las investigaciones demostraron que el uso del chupete durante el primer ao de vida del beb disminuye el riesgo de desarrollar el sndrome de muerte sbita del lactante (SMSL). Permita que el nio se alimente en cada mama todo lo que desee. Contine amamantando al beb hasta que haya terminado de alimentarse. Cuando el beb se desprende o se queda dormido mientras se est alimentando de la primera mama, ofrzcale la segunda. Debido a que, con frecuencia, los recin Land O'Lakes las primeras semanas de vida, es posible  que deba despertar al beb para alimentarlo. Los horarios de Writer de un beb a otro. Sin embargo, las siguientes reglas pueden servir como gua para ayudarla a  Engineer, materials que el beb se alimenta adecuadamente:  Se puede amamantar a los recin nacidos (bebs de 4 semanas o menos de vida) cada 1 a 3 horas.  No deben transcurrir ms de 3 horas durante el da o 5 horas durante la noche sin que se amamante a los recin nacidos.  Debe amamantar al beb 8 veces como mnimo en un perodo de 24 horas, hasta que comience a introducir slidos en su dieta, a los 6 meses de vida aproximadamente. [place] extraccin y Recruitment consultant de la leche materna le permiten asegurarse de que el beb se alimente exclusivamente de [place], aun en momentos en los que no puede amamantar. Esto tiene especial importancia si debe regresar al [place] en el perodo en que an est amamantando o si no puede estar presente en los momentos en que el beb debe alimentarse. Su asesor en lactancia puede orientarla sobre cunto tiempo es [place].  El sacaleche es un aparato que le permite extraer leche de la mama a un recipiente estril. Luego, la leche materna extrada puede almacenarse en un refrigerador o Pension scheme Walsh. Algunos sacaleches son Theodore Demark, James Ivanoff otros son elctricos. Consulte a su asesor en lactancia qu tipo ser ms conveniente para usted. Los sacaleches se pueden comprar; sin embargo, algunos hospitales y grupos de apoyo a la lactancia materna alquilan Production assistant, radio. Un asesor en lactancia puede ensearle cmo extraer OfficeMax Incorporated, en caso de que prefiera no usar un sacaleche.  CMO CUIDAR LAS MAMAS DURANTE LA LACTANCIA MATERNA Los pezones se secan, agrietan y duelen durante la Therapist, nutritional. Las siguientes recomendaciones pueden ayudarla a Hailey Walsh las [employer] y sanas:  Art therapist usar jabn en los pezones.  Use un sostn de soporte. Aunque no son esenciales, las camisetas sin mangas o los sostenes especiales para Economist estn diseados para acceder fcilmente a las mamas, para Economist  sin tener que quitarse todo el sostn o la camiseta. Evite usar sostenes con aro o sostenes muy ajustados.  Seque al aire sus pezones durante 3 a 29minutos despus de amamantar al beb.  Utilice solo apsitos de Chiropodist sostn para Tax adviser las prdidas de [place]. La prdida de un poco de Owens Corning tomas es normal.  Utilice lanolina sobre los pezones luego de Economist. La lanolina ayuda a mantener la humedad normal de la piel. Si [place] lanolina pura, no tiene que lavarse los pezones antes de volver a Research scientist (life sciences) al beb. La lanolina pura no es txica para el beb. Adems, puede extraer Cisco algunas gotas de [place] materna y Community education officer suavemente esa [employer] Resources, para que la [place] se seque al aire. Durante las primeras semanas despus de dar a luz, algunas mujeres pueden experimentar hinchazn en las mamas (congestin [place]). La congestin puede hacer que sienta las mamas pesadas, calientes y sensibles al tacto. El pico de la congestin ocurre dentro de los 3 a 5 das despus del [place]. Las siguientes recomendaciones pueden ayudarla a Public house Walsh la congestin:  Vace por completo las mamas al [place]. Puede aplicar calor hmedo en las mamas (en la ducha o con toallas hmedas para manos) antes de Economist o extraer Northeast Utilities. Esto aumenta la circulacin y [place] a que la [place]. Si el beb no vaca por completo las  mamas cuando lo amamanta, extraiga la leche restante despus de que haya finalizado.  Use un sostn ajustado (para amamantar o comn) o una camiseta sin mangas durante 1 o 2 das para indicar al cuerpo que disminuya ligeramente la produccin de [place].  Aplique compresas de hielo Erie Insurance Group, a menos que le resulte demasiado incmodo.  Asegrese de que el beb est prendido y se encuentre en la posicin correcta mientras lo alimenta. Si la congestin persiste luego de 48 horas o despus de seguir estas recomendaciones, comunquese con su  mdico o un Lobbyist. RECOMENDACIONES GENERALES PARA EL CUIDADO DE LA SALUD DURANTE LA LACTANCIA MATERNA  Consuma alimentos saludables. [place] comidas y colaciones, y coma 3 de cada una por da. Dado que lo que come Solectron Corporation, es posible que algunas comidas hagan que su beb se vuelva ms irritable de lo habitual. Evite comer este tipo de alimentos si percibe que afectan de manera negativa al beb.  Beba leche, jugos de fruta y agua para Engineer, water su sed (aproximadamente [place]).  Descanse con frecuencia, reljese y tome sus vitaminas prenatales para evitar la fatiga, el estrs y la anemia.  Contine con los autocontroles de la mama.  Evite Engineer, manufacturing systems y fumar tabaco. Las sustancias qumicas de los cigarrillos que pasan a la leche materna y la exposicin al humo ambiental del tabaco pueden daar al beb.  No consuma alcohol ni drogas, incluida la marihuana. Algunos medicamentos, que pueden ser perjudiciales para el beb, pueden pasar a travs de la SLM Corporation. Es importante que consulte a su mdico antes de Medical sales representative, incluidos todos los medicamentos recetados y de [place], as como los suplementos vitamnicos y herbales. Puede quedar embarazada durante la lactancia. Si desea controlar la natalidad, consulte a su mdico cules son las opciones ms seguras para el beb. SOLICITE ATENCIN MDICA SI:   Usted siente que quiere dejar de Economist o se siente frustrada con la lactancia.  Siente dolor en las mamas o en los pezones.  Sus pezones estn agrietados o Control and instrumentation engineer.  Sus pechos estn irritados, sensibles o calientes.  Tiene un rea hinchada en cualquiera de las mamas.  Siente escalofros o fiebre.  Tiene nuseas o vmitos.  Presenta una secrecin de otro lquido distinto de la leche materna de los pezones.  Sus mamas no se llenan antes de Economist al beb para el quinto da despus del [place].  Se siente triste y  deprimida.  El beb est demasiado somnoliento como para comer bien.  El beb tiene problemas para dormir.  Moja menos de 3 paales en 24 horas.  Defeca menos de 3 veces en 24 horas.  La piel del beb o la parte blanca de los ojos se vuelven amarillentas.  El beb no ha aumentado de [place] a los [place]. SOLICITE ATENCIN MDICA DE INMEDIATO SI:   El beb est muy cansado Engineer, manufacturing) y no se quiere despertar para comer.  Le sube la fiebre sin causa.   Esta informacin no tiene Marine scientist el consejo del mdico. Asegrese de hacerle al mdico cualquier pregunta que tenga.   Document Released: 10/15/2005 Document Revised: 07/06/2015 Elsevier Interactive Patient Education [employer] Insurance.

## 2016-09-06 NOTE — Progress Notes (Signed)
   PRENATAL VISIT NOTE  Subjective:  Hailey Walsh is a 35 y.o. G4P1021 at [redacted]w[redacted]d being seen today for ongoing prenatal care.  She is currently monitored for the following issues for this high-risk pregnancy and has History of DVT of lower extremity; Supervision of other high risk pregnancies, second trimester; History of cervical incompetence in pregnancy, currently pregnant; and History of pelvic fracture on her problem list.  Patient reports vaginal discharge.  Contractions: Irregular. Vag. Bleeding: None.  Movement: Present. Denies leaking of fluid.   The following portions of the patient's history were reviewed and updated as appropriate: allergies, current medications, past family history, past medical history, past social history, past surgical history and problem list. Problem list updated.  Objective:   Vitals:   09/06/16 0951  BP: 112/76  Pulse: 78  Weight: 180 lb 11.2 oz (82 kg)    Fetal Status: Fetal Heart Rate (bpm): 136 Fundal Height: 34 cm Movement: Present  Presentation: Vertex  General:  Alert, oriented and cooperative. Patient is in no acute distress.  Skin: Skin is warm and dry. No rash noted.   Cardiovascular: Normal heart rate noted  Respiratory: Normal respiratory effort, no problems with respiration noted  Abdomen: Soft, gravid, appropriate for gestational age. Pain/Pressure: Present     Pelvic:  Cervical exam deferred      neg pool, neg fern, cerclage noted  Extremities: Normal range of motion.  Edema: Trace  Mental Status: Normal mood and affect. Normal behavior. Normal judgment and thought content.   Assessment and Plan:  Pregnancy: G4P1021 at [redacted]w[redacted]d  1. History of DVT of lower extremity  2. Supervision of high risk pregnancy in third trimester Continue prenatal care. Cultures today - GC/Chlamydia probe amp ([place])not at [facility] - Culture, beta strep (group b only)  3. History of cervical incompetence in pregnancy, currently  pregnant Cerclage removal next week  4. Vaginal discharge - Wet prep, genital  5. Adjustment disorder with depressed mood - Ambulatory referral to [place]  Preterm labor symptoms and general obstetric precautions including but not limited to vaginal bleeding, contractions, leaking of fluid and fetal movement were reviewed in detail with the patient. Please refer to After Visit Summary for other counseling recommendations.  Return in 1 week (on 09/13/2016) for cerclage removal.   Donnamae Jude, MD

## 2016-09-07 ENCOUNTER — Other Ambulatory Visit: Payer: Self-pay

## 2016-09-07 LAB — WET PREP, GENITAL: TRICH WET PREP: NONE SEEN

## 2016-09-07 LAB — GC/CHLAMYDIA PROBE AMP (~~LOC~~) NOT AT ARMC
Chlamydia: NEGATIVE
Neisseria Gonorrhea: NEGATIVE

## 2016-09-07 MED ORDER — TERCONAZOLE 0.4 % VA CREA: 1.0000 | TOPICAL_CREAM | Freq: Every day | VAGINAL | 0 refills | Status: DC

## 2016-09-07 MED ORDER — METRONIDAZOLE 500 MG PO TABS: 500.0000 mg | ORAL_TABLET | Freq: Two times a day (BID) | ORAL | 0 refills | Status: DC

## 2016-09-07 MED FILL — ?METRONIDAZOLE 500 MG TABLE: 500 | 7 days supply | Qty: 14 | Fill #0

## 2016-09-07 MED FILL — TERCONAZOLE 0.4% VAG CREAM: 0.4 | 7 days supply | Qty: 45 | Fill #0

## 2016-09-07 NOTE — Telephone Encounter (Signed)
Called patient using pacific interpreter. Pt informed of lab results of BV and Yeast. Medication have been called into the pharmacy.

## 2016-09-08 LAB — CULTURE, BETA STREP (GROUP B ONLY)

## 2016-09-12 ENCOUNTER — Ambulatory Visit (INDEPENDENT_AMBULATORY_CARE_PROVIDER_SITE_OTHER): Payer: Self-pay | Admitting: Obstetrics & Gynecology

## 2016-09-12 VITALS — BP 112/74 | HR 90 | Wt 181.9 lb

## 2016-09-12 DIAGNOSIS — O09299 Supervision of pregnancy with other poor reproductive or obstetric history, unspecified trimester: Secondary | ICD-10-CM

## 2016-09-12 DIAGNOSIS — O3433 Maternal care for cervical incompetence, third trimester: Secondary | ICD-10-CM

## 2016-09-12 DIAGNOSIS — O09893 Supervision of other high risk pregnancies, third trimester: Secondary | ICD-10-CM

## 2016-09-12 DIAGNOSIS — O09293 Supervision of pregnancy with other poor reproductive or obstetric history, third trimester: Secondary | ICD-10-CM

## 2016-09-12 NOTE — Progress Notes (Signed)
   PRENATAL VISIT NOTE  Subjective:  Hailey Walsh is a 35 y.o. G4P1021 at [redacted]w[redacted]d being seen today for ongoing prenatal care.  She is currently monitored for the following issues for this high-risk pregnancy and has History of DVT of lower extremity; Supervision of other high risk pregnancies, third trimester; History of cervical incompetence in pregnancy, currently pregnant; and History of pelvic fracture on her problem list.  Patient reports occasional contractions.  Contractions: Irregular. Vag. Bleeding: None.  Movement: Present. Denies leaking of fluid.   The following portions of the patient's history were reviewed and updated as appropriate: allergies, current medications, past family history, past medical history, past social history, past surgical history and problem list. Problem list updated.  Objective:   Vitals:   09/12/16 0848  BP: 112/74  Pulse: 90  Weight: 181 lb 14.4 oz (82.5 kg)    Fetal Status: Fetal Heart Rate (bpm): 150   Movement: Present     General:  Alert, oriented and cooperative. Patient is in no acute distress.  Skin: Skin is warm and dry. No rash noted.   Cardiovascular: Normal heart rate noted  Respiratory: Normal respiratory effort, no problems with respiration noted  Abdomen: Soft, gravid, appropriate for gestational age. Pain/Pressure: Present     Pelvic:  Cervical exam performed        Extremities: Normal range of motion.  Edema: Trace  Mental Status: Normal mood and affect. Normal behavior. Normal judgment and thought content.   Assessment and Plan:  Pregnancy: G4P1021 at [redacted]w[redacted]d  1. Supervision of other high risk pregnancies, third trimester   2. History of cervical incompetence in pregnancy, currently pregnant Cerclage removed intact today, time out and consent done  Preterm labor symptoms and general obstetric precautions including but not limited to vaginal bleeding, contractions, leaking of fluid and fetal movement were reviewed in  detail with the patient. Please refer to After Visit Summary for other counseling recommendations.  Return in about 1 week (around 09/19/2016).   Woodroe Mode, MD

## 2016-09-12 NOTE — Progress Notes (Signed)
Used Chemical engineer [id number].  C/o greenish vaginal discharge. States took vaginal cream for 3 nights. States did not take flagyl- didn't know it was sent to pharmacy.

## 2016-09-19 ENCOUNTER — Ambulatory Visit (INDEPENDENT_AMBULATORY_CARE_PROVIDER_SITE_OTHER): Payer: Self-pay | Admitting: Family Medicine

## 2016-09-19 VITALS — BP 121/83 | HR 71 | Wt 183.0 lb

## 2016-09-19 DIAGNOSIS — O09293 Supervision of pregnancy with other poor reproductive or obstetric history, third trimester: Secondary | ICD-10-CM

## 2016-09-19 DIAGNOSIS — Z86718 Personal history of other venous thrombosis and embolism: Secondary | ICD-10-CM

## 2016-09-19 DIAGNOSIS — O09299 Supervision of pregnancy with other poor reproductive or obstetric history, unspecified trimester: Secondary | ICD-10-CM

## 2016-09-19 DIAGNOSIS — O09893 Supervision of other high risk pregnancies, third trimester: Secondary | ICD-10-CM

## 2016-09-19 DIAGNOSIS — Z8781 Personal history of (healed) traumatic fracture: Secondary | ICD-10-CM

## 2016-09-19 LAB — POCT URINALYSIS DIP (DEVICE)
BILIRUBIN URINE: NEGATIVE
Glucose, UA: NEGATIVE mg/dL
HGB URINE DIPSTICK: NEGATIVE
Ketones, ur: NEGATIVE mg/dL
NITRITE: NEGATIVE
PH: 7 (ref 5.0–8.0)
PROTEIN: NEGATIVE mg/dL
Specific Gravity, Urine: 1.01 (ref 1.005–1.030)
Urobilinogen, UA: 0.2 mg/dL (ref 0.0–1.0)

## 2016-09-19 NOTE — Patient Instructions (Signed)

## 2016-09-19 NOTE — Progress Notes (Signed)
Bedside US performed for presentation - vertex.  Dr. Vanetta Shawl informed.

## 2016-09-19 NOTE — Progress Notes (Signed)
   Subjective:  Hailey Walsh is a 35 y.o. G4P1021 at [redacted]w[redacted]d being seen today for ongoing prenatal care.  She is currently monitored for the following issues for this high-risk pregnancy and has History of DVT of lower extremity; Supervision of other high risk pregnancies, third trimester; History of cervical incompetence in pregnancy, currently pregnant; and History of pelvic fracture on her problem list.  Patient reports no complaints.  Contractions: Irregular.  .  Movement: Present. Denies leaking of fluid.   The following portions of the patient's history were reviewed and updated as appropriate: allergies, current medications, past family history, past medical history, past social history, past surgical history and problem list. Problem list updated.  Objective:   Vitals:   09/19/16 0835  BP: 121/83  Pulse: 71  Weight: 183 lb (83 kg)    Fetal Status: Fetal Heart Rate (bpm): 154 Fundal Height: 37 cm Movement: Present     General:  Alert, oriented and cooperative. Patient is in no acute distress.  Skin: Skin is warm and dry. No rash noted.   Cardiovascular: Normal heart rate noted  Respiratory: Normal respiratory effort, no problems with respiration noted  Abdomen: Soft, gravid, appropriate for gestational age. Pain/Pressure: Present     Pelvic:  Cervical exam performed        Extremities: Normal range of motion.     Mental Status: Normal mood and affect. Normal behavior. Normal judgment and thought content.   Urinalysis:      Assessment and Plan:  Pregnancy: G4P1021 at [redacted]w[redacted]d  1. Supervision of other high risk pregnancies, third trimester - Discussed signs/symptoms of labor - BSUS performed, confirmed vertex  2. History of DVT of lower extremity - No signs of DVT on exam today, taking baby ASA  3. History of cervical incompetence in pregnancy, currently pregnant - Cerclage has been removed previously  4. History of pelvic fracture - OK to proceed with vaginal  delivery  Term labor symptoms and general obstetric precautions including but not limited to vaginal bleeding, contractions, leaking of fluid and fetal movement were reviewed in detail with the patient. Please refer to After Visit Summary for other counseling recommendations.  Return in about 1 week (around 09/26/2016) for Routine OB visit.   Isaias Sakai, DO  OB Fellow Center for [facility], [facility]

## 2016-10-01 ENCOUNTER — Ambulatory Visit (INDEPENDENT_AMBULATORY_CARE_PROVIDER_SITE_OTHER): Payer: Self-pay | Admitting: Obstetrics & Gynecology

## 2016-10-01 VITALS — BP 112/78 | HR 69 | Wt 183.2 lb

## 2016-10-01 DIAGNOSIS — O368130 Decreased fetal movements, third trimester, not applicable or unspecified: Secondary | ICD-10-CM

## 2016-10-01 DIAGNOSIS — O09893 Supervision of other high risk pregnancies, third trimester: Secondary | ICD-10-CM

## 2016-10-01 DIAGNOSIS — O09299 Supervision of pregnancy with other poor reproductive or obstetric history, unspecified trimester: Secondary | ICD-10-CM

## 2016-10-01 DIAGNOSIS — O09293 Supervision of pregnancy with other poor reproductive or obstetric history, third trimester: Secondary | ICD-10-CM

## 2016-10-01 DIAGNOSIS — Z86718 Personal history of other venous thrombosis and embolism: Secondary | ICD-10-CM

## 2016-10-01 NOTE — Progress Notes (Signed)
   PRENATAL VISIT NOTE  Subjective:  Hailey Walsh is a 35 y.o. G4P1021 at [redacted]w[redacted]d being seen today for ongoing prenatal care.  She is currently monitored for the following issues for this high-risk pregnancy and has History of DVT of lower extremity; Supervision of other high risk pregnancies, third trimester; History of cervical incompetence in pregnancy, currently pregnant; and History of pelvic fracture on her problem list.  Patient reports decreased movement and discharge.  Contractions: Irregular. Vag. Bleeding: None.  Movement: (!) Decreased. Denies leaking of fluid.   The following portions of the patient's history were reviewed and updated as appropriate: allergies, current medications, past family history, past medical history, past social history, past surgical history and problem list. Problem list updated.  Objective:   Vitals:   10/01/16 0815  BP: 112/78  Pulse: 69  Weight: 183 lb 3.2 oz (83.1 kg)    Fetal Status: Fetal Heart Rate (bpm): 150   Movement: (!) Decreased     General:  Alert, oriented and cooperative. Patient is in no acute distress.  Skin: Skin is warm and dry. No rash noted.   Cardiovascular: Normal heart rate noted  Respiratory: Normal respiratory effort, no problems with respiration noted  Abdomen: Soft, gravid, appropriate for gestational age. Pain/Pressure: Present     Pelvic:  Cervical exam performed        Extremities: Normal range of motion.  Edema: Trace  Mental Status: Normal mood and affect. Normal behavior. Normal judgment and thought content.   Assessment and Plan:  Pregnancy: G4P1021 at [redacted]w[redacted]d  1. History of DVT of lower extremity   2. Supervision of other high risk pregnancies, third trimester   3. History of cervical incompetence in pregnancy, currently pregnant   4. Decreased fetal movements in third trimester, single or unspecified fetus  - Fetal nonstress test  Term labor symptoms and general obstetric precautions  including but not limited to vaginal bleeding, contractions, leaking of fluid and fetal movement were reviewed in detail with the patient. Please refer to After Visit Summary for other counseling recommendations.  1 week f/u  Woodroe Mode, MD

## 2016-10-01 NOTE — Progress Notes (Signed)
Used interpreter Lockie Mola. States last Monday passed mucous/ or fluid with blood, none since then. States has contractions at time that hurt , but go away with rest. States had greenish discharge 12/2 and 12/3 but today white discharge. States moved a lot on Saturday night, but not much during the day. States on Sunday baby did not move at all. States over the last week baby moving less. States feels like she has inflammation in her stomach- like it gets swollen.

## 2016-10-01 NOTE — Progress Notes (Signed)
NST today reactive

## 2016-10-02 LAB — WET PREP, GENITAL
TRICH WET PREP: NONE SEEN
YEAST WET PREP: NONE SEEN

## 2016-10-08 ENCOUNTER — Ambulatory Visit (INDEPENDENT_AMBULATORY_CARE_PROVIDER_SITE_OTHER): Payer: Self-pay | Admitting: Obstetrics & Gynecology

## 2016-10-08 ENCOUNTER — Ambulatory Visit: Payer: Self-pay

## 2016-10-08 VITALS — BP 115/73 | HR 75 | Wt 186.1 lb

## 2016-10-08 DIAGNOSIS — Z3689 Encounter for other specified antenatal screening: Secondary | ICD-10-CM

## 2016-10-08 DIAGNOSIS — O09893 Supervision of other high risk pregnancies, third trimester: Secondary | ICD-10-CM

## 2016-10-08 DIAGNOSIS — O480 Post-term pregnancy: Secondary | ICD-10-CM

## 2016-10-08 NOTE — Progress Notes (Addendum)
Interpreter Mariel present for encounter.  Pt reports decreased FM x2 days - feels good FM during NST today. IOL scheduled 12/16 @ 0730  Pt informed that the ultrasound is considered a limited OB ultrasound and is not intended to be a complete ultrasound exam.  Patient also informed that the ultrasound is not being completed with the intent of assessing for fetal or placental anomalies or any pelvic abnormalities.  Explained that the purpose of today's ultrasound is to assess for fetal position and amniotic fluid volume.  Patient acknowledges the purpose of the exam and the limitations of the study.

## 2016-10-08 NOTE — Progress Notes (Signed)
NST reactive and normal AFI today   PRENATAL VISIT NOTE  Subjective:  Hailey Walsh is a 35 y.o. G4P1021 at [redacted]w[redacted]d being seen today for ongoing prenatal care.  She is currently monitored for the following issues for this low-risk pregnancy and has History of DVT of lower extremity; Supervision of other high risk pregnancies, third trimester; History of cervical incompetence in pregnancy, currently pregnant; and History of pelvic fracture on her problem list.  Patient reports occasional contractions.  Contractions: Irregular. Vag. Bleeding: None.  Movement: (!) Decreased. Denies leaking of fluid.   The following portions of the patient's history were reviewed and updated as appropriate: allergies, current medications, past family history, past medical history, past social history, past surgical history and problem list. Problem list updated.  Objective:   Vitals:   10/08/16 1406  BP: 115/73  Pulse: 75  Weight: 186 lb 1.6 oz (84.4 kg)    Fetal Status:     Movement: (!) Decreased  Presentation: Vertex  General:  Alert, oriented and cooperative. Patient is in no acute distress.  Skin: Skin is warm and dry. No rash noted.   Cardiovascular: Normal heart rate noted  Respiratory: Normal respiratory effort, no problems with respiration noted  Abdomen: Soft, gravid, appropriate for gestational age. Pain/Pressure: Present     Pelvic:  Cervical exam deferred        Extremities: Normal range of motion.  Edema: Trace  Mental Status: Normal mood and affect. Normal behavior. Normal judgment and thought content.   Assessment and Plan:  Pregnancy: G4P1021 at [redacted]w[redacted]d  1. Post term pregnancy, antepartum condition or complication Normal fetal surveillance today - Fetal nonstress test - US OB Limited  2. Supervision of other high risk pregnancies, third trimester IOL 41 weeks scheduled  Term labor symptoms and general obstetric precautions including but not limited to vaginal bleeding,  contractions, leaking of fluid and fetal movement were reviewed in detail with the patient. Please refer to After Visit Summary for other counseling recommendations.  Return in about 6 weeks (around 11/19/2016) for PP visit.   Woodroe Mode, MD

## 2016-10-09 ENCOUNTER — Encounter: Payer: Self-pay | Admitting: Family

## 2016-10-09 ENCOUNTER — Inpatient Hospital Stay (HOSPITAL_COMMUNITY)
Admission: AD | Admit: 2016-10-09 | Discharge: 2016-10-11 | DRG: 775 | Disposition: A | Discharge status: Home / Self Care | Payer: Medicaid Other | Source: Ambulatory Visit | Attending: Obstetrics and Gynecology | Admitting: Obstetrics and Gynecology

## 2016-10-09 ENCOUNTER — Telehealth (HOSPITAL_COMMUNITY): Payer: Self-pay | Admitting: *Deleted

## 2016-10-09 ENCOUNTER — Encounter (HOSPITAL_COMMUNITY): Payer: Self-pay

## 2016-10-09 DIAGNOSIS — Z3493 Encounter for supervision of normal pregnancy, unspecified, third trimester: Secondary | ICD-10-CM | POA: Diagnosis present

## 2016-10-09 DIAGNOSIS — Z833 Family history of diabetes mellitus: Secondary | ICD-10-CM

## 2016-10-09 DIAGNOSIS — Z86718 Personal history of other venous thrombosis and embolism: Secondary | ICD-10-CM

## 2016-10-09 DIAGNOSIS — O3433 Maternal care for cervical incompetence, third trimester: Secondary | ICD-10-CM | POA: Diagnosis present

## 2016-10-09 DIAGNOSIS — O09893 Supervision of other high risk pregnancies, third trimester: Secondary | ICD-10-CM

## 2016-10-09 DIAGNOSIS — Z3A40 40 weeks gestation of pregnancy: Secondary | ICD-10-CM | POA: Diagnosis not present

## 2016-10-09 DIAGNOSIS — O09299 Supervision of pregnancy with other poor reproductive or obstetric history, unspecified trimester: Secondary | ICD-10-CM

## 2016-10-09 DIAGNOSIS — Z87891 Personal history of nicotine dependence: Secondary | ICD-10-CM | POA: Diagnosis not present

## 2016-10-09 LAB — CBC
HEMATOCRIT: 40.4 % (ref 36.0–46.0)
Hemoglobin: 14.6 g/dL (ref 12.0–15.0)
MCH: 32.3 pg (ref 26.0–34.0)
MCHC: 36.1 g/dL — ABNORMAL HIGH (ref 30.0–36.0)
MCV: 89.4 fL (ref 78.0–100.0)
PLATELETS: 164 10*3/uL (ref 150–400)
RBC: 4.52 MIL/uL (ref 3.87–5.11)
RDW: 13.2 % (ref 11.5–15.5)
WBC: 9 10*3/uL (ref 4.0–10.5)

## 2016-10-09 LAB — TYPE AND SCREEN
ABO/RH(D): O POS
Antibody Screen: NEGATIVE

## 2016-10-09 MED ORDER — OXYCODONE-ACETAMINOPHEN 5-325 MG PO TABS
2.0000 | ORAL_TABLET | ORAL | Status: DC | PRN
Start: 2016-10-09 — End: 2016-10-10

## 2016-10-09 MED ORDER — OXYTOCIN 40 UNITS IN LACTATED RINGERS INFUSION - SIMPLE MED
INTRAVENOUS | Status: AC
  Filled 2016-10-09: qty 1000

## 2016-10-09 MED ORDER — FLEET ENEMA 7-19 GM/118ML RE ENEM: 1.0000 | ENEMA | RECTAL | Status: DC | PRN

## 2016-10-09 MED ORDER — LACTATED RINGERS IV SOLN: INTRAVENOUS | Status: DC

## 2016-10-09 MED ORDER — OXYTOCIN BOLUS FROM INFUSION
500.0000 mL | Freq: Once | INTRAVENOUS | Status: AC
  Administered 2016-10-09: 500 mL via INTRAVENOUS

## 2016-10-09 MED ORDER — LIDOCAINE HCL (PF) 1 % IJ SOLN
INTRAMUSCULAR | Status: AC
  Filled 2016-10-09: qty 30

## 2016-10-09 MED ORDER — ACETAMINOPHEN 325 MG PO TABS: 650.0000 mg | ORAL_TABLET | ORAL | Status: DC | PRN

## 2016-10-09 MED ORDER — OXYTOCIN 40 UNITS IN LACTATED RINGERS INFUSION - SIMPLE MED
2.5000 [IU]/h | INTRAVENOUS | Status: DC
  Administered 2016-10-09: 2.5 [IU]/h via INTRAVENOUS

## 2016-10-09 MED ORDER — OXYCODONE-ACETAMINOPHEN 5-325 MG PO TABS
1.0000 | ORAL_TABLET | ORAL | Status: DC | PRN
  Administered 2016-10-09: 1 via ORAL
  Filled 2016-10-09: qty 1

## 2016-10-09 MED ORDER — SOD CITRATE-CITRIC ACID 500-334 MG/5ML PO SOLN: 30.0000 mL | ORAL | Status: DC | PRN

## 2016-10-09 MED ORDER — LIDOCAINE HCL (PF) 1 % IJ SOLN
30.0000 mL | INTRAMUSCULAR | Status: DC | PRN
  Filled 2016-10-09: qty 30

## 2016-10-09 MED ORDER — ONDANSETRON HCL 4 MG/2ML IJ SOLN: 4.0000 mg | Freq: Four times a day (QID) | INTRAMUSCULAR | Status: DC | PRN

## 2016-10-09 MED ORDER — LACTATED RINGERS IV SOLN: 500.0000 mL | INTRAVENOUS | Status: DC | PRN

## 2016-10-09 NOTE — Telephone Encounter (Signed)
Preadmission screen  

## 2016-10-09 NOTE — H&P (Signed)
LABOR AND DELIVERY ADMISSION HISTORY AND PHYSICAL NOTE  Hailey Walsh is a 35 y.o. female [phone] with IUP at [redacted]w[redacted]d by ultrasound presenting for SOL.  Contractions began around 8AM this morning.  No LOF or vaginal bleeding.   Has a headache currently.  CTX about 2 hours apart this morning.   She reports positive fetal movement.   Prenatal History/Complications: DVT and history of cervical incompetence in pregnancy, history of pelvic fx with surgery  Past Medical History: Past Medical History:  Diagnosis Date  . Anxiety    patient denies anxiety  . Depression    no meds currently  . DVT (deep venous thrombosis) ([place]) 11/2014   right lower leg  . Incompetent cervix 2011  . SVD (spontaneous vaginal delivery)    x 1    Past Surgical History: Past Surgical History:  Procedure Laterality Date  . ABDOMINAL SURGERY    . CERVICAL CERCLAGE  10/16/2010  . CERVICAL CERCLAGE N/A 04/24/2016   Procedure: CERCLAGE CERVICAL;  Surgeon: Donnamae Jude, MD;  Location: [facility];  Service: Gynecology;  Laterality: N/A;  . HARDWARE REMOVAL Right 05/17/2015   Procedure: HARDWARE REMOVAL RIGHT ILIAC SCREW;  Surgeon: Altamese [place], MD;  Location: [place];  Service: Orthopedics;  Laterality: Right;  . ORIF WRIST FRACTURE Left 12/17/2014   Procedure: OPEN REDUCTION INTERNAL FIXATION (ORIF) WRIST FRACTURE;  Surgeon: Roseanne Kaufman, MD;  Location: [place];  Service: Orthopedics;  Laterality: Left;  . SACRO-ILIAC PINNING Left 12/17/2014   Procedure: [place];  Surgeon: Rozanna Box, MD;  Location: [facility];  Service: Orthopedics;  Laterality: Left;    Obstetrical History: OB History    Gravida Para Term Preterm AB Living   4 1 1  0 2 1   SAB TAB Ectopic Multiple Live Births   2 0 0 0        Social History: Social History   Social History  . Marital status: Single    Spouse name: N/A  . Number of children: N/A  . Years of education: N/A   Social History Main Topics  . Smoking status:  Former Smoker    Types: Cigarettes    Quit date: 10/30/2015  . Smokeless tobacco: Never Used     Comment: Intermittent smoker-occasional  . Alcohol use No  . Drug use: No  . Sexual activity: Not Currently    Birth control/ protection: None   Other Topics Concern  . Not on file   Social History Narrative  . No narrative on file    Family History: Family History  Problem Relation Age of Onset  . Cancer Mother   . Diabetes Brother     Allergies: No Known Allergies  Prescriptions Prior to Admission  Medication Sig Dispense Refill Last Dose  . acetaminophen (TYLENOL) 500 MG tablet Take 500 mg by mouth every 6 (six) hours as needed for mild pain.   10/09/2016  . aspirin EC 81 MG tablet Take 1 tablet (81 mg total) by mouth daily. 300 tablet 2 10/09/2016  . Prenatal Vit-Fe Fumarate-FA (MULTIVITAMIN-PRENATAL) 27-0.8 MG TABS tablet Take 1 tablet by mouth daily at 12 noon.   10/09/2016     Review of Systems   All systems reviewed and negative except as stated in HPI  Blood pressure (!) 125/104, pulse 74, temperature 97.9 F (36.6 C), temperature source Oral, resp. rate 20, height 5' (1.524 m), weight 180 lb (81.6 kg), last menstrual period 12/09/2015, SpO2 99 %. General appearance: alert, cooperative and appears stated  age Lungs: clear to auscultation bilaterally Heart: regular rate and rhythm Abdomen: soft, non-tender; bowel sounds normal Extremities: No calf swelling or tenderness Presentation: cephalic Fetal monitoring:  Baseline 145, moderate variability, +accels, no decels Uterine activity:Q 5 min Dilation: 7.5 Effacement (%): 90 Station: -2 Exam by:: Jannifer Franklin, RN   Prenatal labs: ABO, Rh: O/Positive/-- (06/22 0000) Antibody: Negative (06/22 0000) Rubella: immune RPR: NON REAC (09/15 1017)  HBsAg: Negative (06/22 0000)  HIV: NONREACTIVE (09/15 1017)  GBS: Negative (11/09 0000)  1 hr Glucola: normal Genetic screening:  neg Anatomy US: wnl  Prenatal  Transfer Tool  Maternal Diabetes: No Genetic Screening: Normal Maternal Ultrasounds/Referrals: Normal Fetal Ultrasounds or other Referrals:  None Maternal Substance Abuse:  No Significant Maternal Medications:  None Significant Maternal Lab Results: None  No results found for this or any previous visit (from the past 24 hour(s)).  Patient Active Problem List   Diagnosis Date Noted  . Normal labor and delivery 10/09/2016  . History of pelvic fracture 05/13/2016  . Supervision of other high risk pregnancies, third trimester 05/08/2016  . History of cervical incompetence in pregnancy, currently pregnant 05/08/2016  . History of DVT of lower extremity 06/09/2014    Assessment: Hailey Walsh is a 35 y.o. G4P1021 at [redacted]w[redacted]d here for SOL  #Labor:Anticipate NSVD.  #Pain: May request epidural #FWB:  Cat 1 #ID:  GBS Neg #MOF: Breast #MOC:Nexplanon #Circ:  N/A  Lovenia Kim, MD PGY-1 10/09/2016, 9:30 PM   OB FELLOW HISTORY AND PHYSICAL ATTESTATION  I have seen and examined this patient; I agree with above documentation in the resident's note.    Hailey Walsh 10/09/2016, 10:31 PM

## 2016-10-09 NOTE — Progress Notes (Signed)
Recv'd pt in rm 170 report from [employer]. G4p1 SVE 9/100/0. Pt spanish speaking only. Pt arrived on BS at 2100. Pt refusing epidural for pain per translator Mrs. Col. FHT 150 UC 2-3

## 2016-10-09 NOTE — MAU Note (Signed)
Pt reports contractions, some bleeding.

## 2016-10-10 ENCOUNTER — Encounter (HOSPITAL_COMMUNITY): Payer: Self-pay

## 2016-10-10 LAB — SYPHILIS: RPR W/REFLEX TO RPR TITER AND TREPONEMAL ANTIBODIES, TRADITIONAL SCREENING AND DIAGNOSIS ALGORITHM: RPR Ser Ql: NONREACTIVE

## 2016-10-10 MED ORDER — ZOLPIDEM TARTRATE 5 MG PO TABS: 5.0000 mg | ORAL_TABLET | Freq: Every evening | ORAL | Status: DC | PRN

## 2016-10-10 MED ORDER — DIPHENHYDRAMINE HCL 25 MG PO CAPS: 25.0000 mg | ORAL_CAPSULE | Freq: Four times a day (QID) | ORAL | Status: DC | PRN

## 2016-10-10 MED ORDER — ONDANSETRON HCL 4 MG PO TABS: 4.0000 mg | ORAL_TABLET | ORAL | Status: DC | PRN

## 2016-10-10 MED ORDER — WITCH HAZEL-GLYCERIN EX PADS: 1.0000 "application " | MEDICATED_PAD | CUTANEOUS | Status: DC | PRN

## 2016-10-10 MED ORDER — PNEUMOCOCCAL VAC POLYVALENT 25 MCG/0.5ML IJ INJ
0.5000 mL | INJECTION | INTRAMUSCULAR | Status: AC
  Administered 2016-10-11: 0.5 mL via INTRAMUSCULAR
  Filled 2016-10-10 (×3): qty 0.5

## 2016-10-10 MED ORDER — BENZOCAINE-MENTHOL 20-0.5 % EX AERO
1.0000 "application " | INHALATION_SPRAY | CUTANEOUS | Status: DC | PRN
  Administered 2016-10-10: 1 via TOPICAL
  Filled 2016-10-10: qty 56

## 2016-10-10 MED ORDER — PRENATAL MULTIVITAMIN CH
1.0000 | ORAL_TABLET | Freq: Every day | ORAL | Status: DC
  Administered 2016-10-10 – 2016-10-11 (×2): 1 via ORAL
  Filled 2016-10-10 (×2): qty 1

## 2016-10-10 MED ORDER — SIMETHICONE 80 MG PO CHEW: 80.0000 mg | CHEWABLE_TABLET | ORAL | Status: DC | PRN

## 2016-10-10 MED ORDER — ACETAMINOPHEN 325 MG PO TABS
650.0000 mg | ORAL_TABLET | ORAL | Status: DC | PRN
  Administered 2016-10-10 – 2016-10-11 (×3): 650 mg via ORAL
  Filled 2016-10-10 (×3): qty 2

## 2016-10-10 MED ORDER — IBUPROFEN 600 MG PO TABS
600.0000 mg | ORAL_TABLET | Freq: Four times a day (QID) | ORAL | Status: DC
  Administered 2016-10-10 – 2016-10-11 (×6): 600 mg via ORAL
  Filled 2016-10-10 (×7): qty 1

## 2016-10-10 MED ORDER — COCONUT OIL OIL: 1.0000 "application " | TOPICAL_OIL | Status: DC | PRN

## 2016-10-10 MED ORDER — TETANUS-DIPHTH-ACELL PERTUSSIS 5-2.5-18.5 LF-MCG/0.5 IM SUSP: 0.5000 mL | Freq: Once | INTRAMUSCULAR | Status: DC

## 2016-10-10 MED ORDER — DIBUCAINE 1 % RE OINT: 1.0000 "application " | TOPICAL_OINTMENT | RECTAL | Status: DC | PRN

## 2016-10-10 MED ORDER — ONDANSETRON HCL 4 MG/2ML IJ SOLN: 4.0000 mg | INTRAMUSCULAR | Status: DC | PRN

## 2016-10-10 MED ORDER — SENNOSIDES-DOCUSATE SODIUM 8.6-50 MG PO TABS
2.0000 | ORAL_TABLET | ORAL | Status: DC
  Administered 2016-10-10 – 2016-10-11 (×2): 2 via ORAL
  Filled 2016-10-10 (×2): qty 2

## 2016-10-10 NOTE — Lactation Note (Signed)
This note was copied from a baby's chart. Lactation Consultation Note Initial visit at 23 hours of age with spanish interpreter [place].  Mom is formula and breast feeding.  Mom reports difficulty ever getting her older child to latch.  Last formula feeding in a bottle >5 hours ago with 21mls given.  Baby awakened in crib at bedside,  Lc offered to assist with latching and mom is agreeable.   Mom has small breasts with wide spacing and report + breast changes during pregnancy.  Mom has semi flat right nipple that everts with stimulation.  Left nipple semi inverted and remains flat but somewhat compressible with compression.  After a few minutes of hand expression mom was able to express a few drops of colostrum.  LC assisted mom with hand pumping on left breast.  Lc assisted with football hold on left breast.  Baby is eagerly rooting, but will not open mouth wide for latching.  Moved baby to cross cradle hold on right breast, baby latched with few sucks and needing stimulation to maintain feeding.  Baby sleepy and sucking on and off for about 10 minutes STS with mom.    [place] educated mom in depth about how bottle feeding and formula will negatively impact latching and milk production.  Mom plans to attempt latch and will bottle feed as desired.  Lc advised baby would benefit from delaying bottle feedings to practice more with latching.  Mom wants to breast feed but doesn't plan to delay bottle feedings.    [facility] LC resources given and discussed.  Encouraged to feed with early cues on demand.  Early newborn behavior discussed.  Hand expression demonstrated by mom with colostrum visible.  Mom to call for assist as needed.     Patient Name: Hailey Walsh [id number] Date: 10/10/2016 Reason for consult: Initial assessment;Difficult latch   Maternal Data Has patient been taught Hand Expression?: Yes Does the patient have breastfeeding experience prior to this delivery?: Yes  Feeding Feeding Type:  Breast Fed Length of feed:  (few minutes)  LATCH Score/Interventions Latch: Repeated attempts needed to sustain latch, nipple held in mouth throughout feeding, stimulation needed to elicit sucking reflex. Intervention(s): Adjust position;Assist with latch;Breast massage;Breast compression  Audible Swallowing: A few with stimulation Intervention(s): Skin to skin;Hand expression;Alternate breast massage  Type of Nipple: Flat Intervention(s): Hand pump  Comfort (Breast/Nipple): Soft / non-tender     Hold (Positioning): Assistance needed to correctly position infant at breast and maintain latch. Intervention(s): Breastfeeding basics reviewed;Support Pillows;Position options;Skin to skin  LATCH Score: 6  Lactation Tools Discussed/Used WIC Program: No (plans to call) Pump Review: Setup, frequency, and cleaning Initiated by:: JS Date initiated:: 10/10/16   Consult Status Consult Status: Follow-up Date: 10/11/16 Follow-up type: In-patient    Justice Britain 10/10/2016, 10:21 PM

## 2016-10-10 NOTE — Progress Notes (Signed)
UR chart review completed.  

## 2016-10-10 NOTE — Progress Notes (Signed)
Pt spanish only, translator at bedside for delivery martha Col. Pt instructed by Dr Lorita Officer w/ translator for poc for vag delivery. Pt verbalized understnding of labor process and how to push w/  Uc's. Cont poc.

## 2016-10-10 NOTE — Progress Notes (Signed)
Pt given poc via translator [place]. At bedside. All question answered. Stable pt and viable infant girl vag del @2215  apgar 9,9. ebl 100. Cont. Poc. Translator  And pt spouse at bs.

## 2016-10-11 MED ORDER — ACETAMINOPHEN 325 MG PO TABS: 650.0000 mg | ORAL_TABLET | ORAL | 0 refills | Status: AC | PRN

## 2016-10-11 MED ORDER — IBUPROFEN 600 MG PO TABS: 600.0000 mg | ORAL_TABLET | Freq: Four times a day (QID) | ORAL | 0 refills | Status: DC

## 2016-10-11 MED ORDER — ASPIRIN EC 81 MG PO TBEC: 81.0000 mg | DELAYED_RELEASE_TABLET | Freq: Every day | ORAL | 0 refills | Status: AC

## 2016-10-11 MED FILL — ?IBUPROFEN 600 MG TABLET: 600 MG | 7 days supply | Qty: 30 | Fill #0

## 2016-10-11 NOTE — Discharge Summary (Signed)
OB Discharge Summary     Patient Name: Hailey Walsh DOB: Jul 07, 1981 MRN: [id number]  Date of admission: 10/09/2016 Delivering MD: Hailey Walsh Hailey Walsh   Date of discharge: 10/11/2016  Admitting diagnosis: 40w ctx 10 min Intrauterine pregnancy: [redacted]w[redacted]d     Secondary diagnosis:  Active Problems:   Normal labor and delivery  Additional problems: none     Discharge diagnosis: Term Pregnancy Delivered                                                                                                Post partum procedures:none  Augmentation: none  Complications: None  Hospital course:  Onset of Labor With Vaginal Delivery     35 y.o. yo [id number] at [redacted]w[redacted]d was admitted in Latent Labor on 10/09/2016. Patient had an uncomplicated labor course as follows:  Membrane Rupture Time/Date: 10:08 PM ,10/09/2016   Intrapartum Procedures: Episiotomy: None [1]                                         Lacerations:  1st degree [2]  Patient had a delivery of a Viable infant. 10/09/2016  Information for the patient's newborn:  Hailey Walsh [id number]  Delivery Method: Vaginal, Spontaneous Delivery (Filed from Delivery Summary)    Pateint had an uncomplicated postpartum course.  She is ambulating, tolerating a regular diet, passing flatus, and urinating well. Patient is discharged home in stable condition on 10/11/16.    Physical exam Vitals:   10/10/16 0127 10/10/16 0529 10/10/16 1800 10/11/16 0550  BP: 108/69 121/82 117/75 117/71  Pulse: 78 67 72 66  Resp: 18 20 18 18   Temp: 97.9 F (36.6 C) 97.2 F (36.2 C) 98.6 F (37 C) 98.7 F (37.1 C)  TempSrc: Oral Oral Oral Oral  SpO2:      Weight:      Height:       General: alert, cooperative and no distress Lochia: appropriate Uterine Fundus: firm Incision: N/A DVT Evaluation: No evidence of DVT seen on physical exam. Negative Homan's sign. No cords or calf tenderness. No significant calf/ankle  edema. Labs: Lab Results  Component Value Date   WBC 9.0 10/09/2016   HGB 14.6 10/09/2016   HCT 40.4 10/09/2016   MCV 89.4 10/09/2016   PLT 164 10/09/2016   CMP Latest Ref Rng & Units 04/11/2016  Glucose 65 - 99 mg/dL 117(H)  BUN 7 - 25 mg/dL 10  Creatinine 0.50 - 1.10 mg/dL 0.63  Sodium 135 - 146 mmol/L 137  Potassium 3.5 - 5.3 mmol/L 4.2  Chloride 98 - 110 mmol/L 104  CO2 20 - 31 mmol/L 22  Calcium 8.6 - 10.2 mg/dL 8.8  Total Protein 6.1 - 8.1 g/dL 6.7  Total Bilirubin 0.2 - 1.2 mg/dL 0.3  Alkaline Phos 33 - 115 U/L 49  AST 10 - 30 U/L 17  ALT 6 - 29 U/L 15    Discharge instruction: per After Visit Summary and "Baby and Me Booklet".  After visit meds:  Medication List    STOP taking these medications   aspirin EC 81 MG tablet   multivitamin-prenatal 27-0.8 MG Tabs tablet     TAKE these medications   acetaminophen 325 MG tablet Commonly known as:  TYLENOL Take 2 tablets (650 mg total) by mouth every 4 (four) hours as needed (for pain scale < 4). What changed:  medication strength  how much to take  when to take this  reasons to take this   ibuprofen 600 MG tablet Commonly known as:  ADVIL,MOTRIN Take 1 tablet (600 mg total) by mouth every 6 (six) hours.       Diet: routine diet  Activity: Advance as tolerated. Pelvic rest for 6 weeks.   Outpatient follow up:6 weeks Follow up Appt:Future Appointments Date Time Provider [place]  10/13/2016 7:30 AM [facility] ROOM [facility] None  11/19/2016 10:20 AM Manya Silvas, CNM [facility]   Follow up Visit:No Follow-up on file.  Postpartum contraception: Nexplanon  Newborn Data: Live born female  Birth Weight: 6 lb 13 oz (3090 g) APGAR: 9, 9  Baby Feeding: Breast Disposition:home with mother   10/11/2016 Hailey Antigua I, DO  Walsh was present for the exam and agree with above.  [place], [place] 10/11/2016 9:56 AM

## 2016-10-11 NOTE — Lactation Note (Signed)
This note was copied from a baby's chart. Lactation Consultation Note  Velna Hatchet Interpreter used [phone] for [place]. P2, Breast and formula.  Mother's left nipple is shorter and baby seems frustrated after bf for a few minutes. Colostrum easily expressed. Applied #20 NS and prefilled w/ formula. Baby sustained latch longer on L side w/ NS. Baby very frantic so suggest giving baby formula first and the re-latched baby. Reviewed engorgement care and monitoring voids/stools. Mom encouraged to feed baby 8-12 times/24 hours and with feeding cues.           Patient Name: Hailey Walsh [id number] Date: 10/11/2016     Maternal Data    Feeding Feeding Type: Breast Fed Length of feed: 15 min  LATCH Score/Interventions                      Lactation Tools Discussed/Used     Consult Status      Carlye Grippe 10/11/2016, 10:05 AM

## 2016-10-11 NOTE — Discharge Instructions (Signed)
Continue baby Aspirin for 6 weeks after delivery.   Postpartum Care After Vaginal Delivery The period of time right after you deliver your newborn is called the postpartum period. What kind of medical care will I receive?  You may continue to receive fluids and medicines through an IV tube inserted into one of your veins.  If an incision was made near your vagina (episiotomy) or if you had some vaginal tearing during delivery, cold compresses may be placed on your episiotomy or your tear. This helps to reduce pain and swelling.  You may be given a squirt bottle to use when you go to the bathroom. You may use this until you are comfortable wiping as usual. To use the squirt bottle, follow these steps:  Before you urinate, fill the squirt bottle with warm water. Do not use hot water.  After you urinate, while you are sitting on the toilet, use the squirt bottle to rinse the area around your urethra and vaginal opening. This rinses away any urine and blood.  You may do this instead of wiping. As you start healing, you may use the squirt bottle before wiping yourself. Make sure to wipe gently.  Fill the squirt bottle with clean water every time you use the bathroom.  You will be given sanitary pads to wear. How can I expect to feel?  You may not feel the need to urinate for several hours after delivery.  You will have some soreness and pain in your abdomen and vagina.  If you are breastfeeding, you may have uterine contractions every time you breastfeed for up to several weeks postpartum. Uterine contractions help your uterus return to its normal size.  It is normal to have vaginal bleeding (lochia) after delivery. The amount and appearance of lochia is often similar to a menstrual period in the first week after delivery. It will gradually decrease over the next few weeks to a dry, yellow-brown discharge. For most women, lochia stops completely by 6-8 weeks after delivery. Vaginal bleeding  can vary from woman to woman.  Within the first few days after delivery, you may have breast engorgement. This is when your breasts feel heavy, full, and uncomfortable. Your breasts may also throb and feel hard, tightly stretched, warm, and tender. After this occurs, you may have milk leaking from your breasts.Your health care provider can help you relieve discomfort due to breast engorgement. Breast engorgement should go away within a few days.  You may feel more sad or worried than normal due to hormonal changes after delivery. These feelings should not last more than a few days. If these feelings do not go away after several days, speak with your health care provider. How should I care for myself?  Tell your health care provider if you have pain or discomfort.  Drink enough water to keep your urine clear or pale yellow.  Wash your hands thoroughly with soap and water for at least 20 seconds after changing your sanitary pads, after using the toilet, and before holding or feeding your baby.  If you are not breastfeeding, avoid touching your breasts a lot. Doing this can make your breasts produce more milk.  If you become weak or lightheaded, or you feel like you might faint, ask for help before:  Getting out of bed.  Showering.  Change your sanitary pads frequently. Watch for any changes in your flow, such as a sudden increase in volume, a change in color, the passing of large blood clots. If  you pass a blood clot from your vagina, save it to show to your health care provider. Do not flush blood clots down the toilet without having your health care provider look at them.  Make sure that all your vaccinations are up to date. This can help protect you and your baby from getting certain diseases. You may need to have immunizations done before you leave the hospital.  If desired, talk with your health care provider about methods of family planning or birth control (contraception). How can I  start bonding with my baby? Spending as much time as possible with your baby is very important. During this time, you and your baby can get to know each other and develop a bond. Having your baby stay with you in your room (rooming in) can give you time to get to know your baby. Rooming in can also help you become comfortable caring for your baby. Breastfeeding can also help you bond with your baby. How can I plan for returning home with my baby?  Make sure that you have a car seat installed in your vehicle.  Your car seat should be checked by a certified car seat installer to make sure that it is installed safely.  Make sure that your baby fits into the car seat safely.  Ask your health care provider any questions you have about caring for yourself or your baby. Make sure that you are able to contact your health care provider with any questions after leaving the hospital. This information is not intended to replace advice given to you by your health care provider. Make sure you discuss any questions you have with your health care provider. Document Released: 08/12/2007 Document Revised: 03/19/2016 Document Reviewed: 09/19/2015 Elsevier Interactive Patient Education  2017 [employer].

## 2016-10-11 NOTE — Progress Notes (Signed)
CSW received consult for hx of depression.  CSW is unable to meet with MOB at this time do to limited staffing, but spoke with RN who states no current concerns noted.  CSW provided RN with resources for MOB regarding perinatal mood disorders.  CSW asked that RN speak with MOB and offer meeting with CSW later today if she would like.  RN agreed.

## 2016-10-13 ENCOUNTER — Inpatient Hospital Stay (HOSPITAL_COMMUNITY): Admission: RE | Admit: 2016-10-13 | Payer: Self-pay | Source: Ambulatory Visit

## 2016-11-19 ENCOUNTER — Ambulatory Visit (INDEPENDENT_AMBULATORY_CARE_PROVIDER_SITE_OTHER): Payer: Self-pay | Admitting: Advanced Practice Midwife

## 2016-11-19 DIAGNOSIS — Z30011 Encounter for initial prescription of contraceptive pills: Secondary | ICD-10-CM

## 2016-11-19 MED ORDER — NORGESTIMATE-ETH ESTRADIOL 0.25-35 MG-MCG PO TABS: 1.0000 | ORAL_TABLET | Freq: Every day | ORAL | 12 refills | Status: DC

## 2016-11-19 MED FILL — MONO-LINYAH 28 TABLET: 0.25-35 | 28 days supply | Qty: 28 | Fill #0

## 2016-11-19 NOTE — Patient Instructions (Signed)
Depresión posparto y baby blues  (Postpartum Depression and Baby Blues)  El período del posparto comienza inmediatamente después del nacimiento de un bebé y suele ser una época de gran felicidad y mucho entusiasmo. También es tiempo de muchos cambios en la vida de los padres. Sin importar cuántos hijos tenga una madre, cada niño plantea nuevos desafíos y una nueva dinámica a la familia. Es frecuente que haya sentimientos de entusiasmo junto con cambios confusos en el estado de ánimo, las emociones y los pensamientos. Todas las madres corren riesgo de tener depresión posparto o "baby blues". Estos cambios en el estado de ánimo pueden presentarse inmediatamente después del parto o muchos meses después del nacimiento de un niño. El baby blues o la depresión posparto pueden ser leves o graves. Además, la depresión posparto puede desaparecer con bastante rapidez o puede ser una enfermedad de larga evolución.  CAUSAS  Se cree que el aumento de las concentraciones hormonales y su rápida disminución es la causa principal de la depresión posparto y el baby blues. Durante y después del embarazo, un número de hormonas cambian. El estrógeno y la progesterona generalmente disminuyen inmediatamente después del parto. También disminuyen con rapidez las concentraciones de la hormona tiroidea y de varios esteroides del cortisol. Otros factores que juegan un papel en estos cambios anímicos incluyen los sucesos importantes de la vida y la genética.  FACTORES DE RIESGO  Si tiene alguno de los siguientes riesgos de desarrollar baby blues o depresión posparto, sepa a qué síntomas debe estar atenta durante el período del posparto. Los factores de riesgo que pueden aumentar la probabilidad de tener baby blues o depresión posparto incluyen lo siguiente:  · Antecedentes personales o familiares de depresión.  · Depresión durante el embarazo.  · Problemas anímicos premenstruales o que guardan relación con los anticonceptivos orales.  · Mucho  estrés en la vida.  · Conflictos maritales.  · Falta de una red de apoyo social.  · Tener un bebé con necesidades especiales.  · Problemas de salud, como diabetes.  SIGNOS Y SÍNTOMAS  Los síntomas de baby blues incluyen lo siguiente:  · Cambios en el estado de ánimo durante lapsos breves, como pasar de la felicidad extrema a la tristeza.  · Falta de concentración.  · Dificultad para dormir.  · Ataques de llanto, sensibilidad emocional.  · Irritabilidad.  · Ansiedad.  Generalmente, los síntomas de depresión posparto comienzan en el término del primer mes después del parto. Estos síntomas incluyen:  · Dificultad para dormir o somnolencia excesiva.  · Pérdida de peso notable.  · Agitación.  · Sentimientos de inutilidad.  · Falta de interés en las actividades o la comida.  La psicosis posparto es una enfermedad muy grave que puede ser peligrosa. Afortunadamente, es poco frecuente. Si aparece alguno de los siguientes síntomas, se debe recibir atención médica de inmediato. Los síntomas de psicosis posparto incluyen lo siguiente:  · Alucinaciones y delirios.  · Comportamiento atípico o desorganizado.  · Confusión o desorientación.  DIAGNÓSTICO  El diagnóstico se realiza mediante la evaluación de los síntomas. No hay exámenes médicos ni pruebas de laboratorio que permitan hacer un diagnóstico, pero hay varios cuestionarios que el médico puede usar para identificar a las personas que tienen baby blues, depresión posparto o psicosis. A menudo se usa una herramienta de detección sistemática llamada [place] de depresión posnatal de [place] para diagnosticar la depresión durante el período del posparto.  TRATAMIENTO  Generalmente, el baby blues desaparece solo en el término de 1 o   2 semanas. A menudo, todo lo que se necesita es apoyo social. Se le recomendará que duerma y descanse lo suficiente. En ocasiones, se le pueden administrar medicamentos para ayudarla a dormir.  La depresión posparto requiere tratamiento porque puede  durar varios meses o más tiempo si no se la trata. El tratamiento puede incluir terapia individual o grupal, medicamentos o ambos para abordar los factores sociales, fisiológicos y psicológicos que pueden tener influencia en la depresión. También pueden recomendarse enfáticamente el ejercicio regular, una alimentación sana, el descanso y el apoyo social.  La psicosis posparto es una enfermedad más grave y requiere tratamiento inmediato. A menudo es necesaria la hospitalización.  INSTRUCCIONES PARA EL CUIDADO EN EL HOGAR  · Descanse todo lo posible. Tome una siesta cuando el bebé duerme.  · Haga ejercicios regularmente. Para algunas mujeres, el yoga y las caminatas son beneficiosas.  · Consuma una dieta equilibrada y nutritiva.  · Haga pequeñas cosas que disfruta. Tome una taza de té, dese un baño de burbujas, lea su revista favorita o escuche su música predilecta.  · Evite el alcohol.  · Pida ayuda con los quehaceres domésticos, la cocina, las compras de comida o las obligaciones diarias si lo necesita. No intente hacer todo.  · Hable con personas allegadas sobre cómo se siente. Busque el apoyo de su pareja, sus familiares, sus amigos o de otras madres primerizas.  · Intente pensar positivamente. Piense en aquellas cosas por las que se siente agradecida.  · No pase mucho tiempo sola.  · Tome solo medicamentos de venta libre o recetados, según las indicaciones del médico.  · Cumpla con todos los controles del postparto.  · Infórmele a su médico sobre cualquier inquietud que tenga.    SOLICITE ATENCIÓN MÉDICA SI:  Tiene una reacción al medicamento o problemas con este.  SOLICITE ATENCIÓN MÉDICA DE INMEDIATO SI:  · Tiene sentimientos suicidas.  · Cree que podría lastimar al bebé o a otra persona.    ASEGÚRESE DE QUE:  · Comprende estas instrucciones.  · Controlará su afección.  · Recibirá ayuda de inmediato si no mejora o si empeora.    Esta información no tiene como fin reemplazar el consejo del médico. Asegúrese de  hacerle al médico cualquier pregunta que tenga.  Document Released: 04/02/2008 Document Revised: 10/20/2013 Document Reviewed: 07/27/2013  Elsevier Interactive Patient Education © 2017 Elsevier Inc.

## 2016-11-19 NOTE — Progress Notes (Signed)
Subjective:     Hailey Walsh is a 36 y.o. female who presents for a postpartum visit. She is 6 weeks postpartum following a spontaneous vaginal delivery. I have fully reviewed the prenatal and intrapartum course. The delivery was at 41 gestational weeks. Outcome: spontaneous vaginal delivery. Anesthesia: none. Postpartum course has been remarkable for intermittant constipation. Baby's course has been unremarkable. Baby is feeding by bottle - Similac Advance. Bleeding staining only. Bowel function is abnormal: constipation. Bladder function is normal. Patient is not sexually active. Contraception method is none. Postpartum depression screening: positive.  Spanish interpreter "Earnest Bailey" used for visit  The following portions of the patient's history were reviewed and updated as appropriate: allergies, current medications, past family history, past medical history, past social history, past surgical history and problem list.  Review of Systems Behavioral/Psych: positive for depression, fatigue and sleep disturbance 2/2 baby waking frequently during the night. Pt is not able to nap during the day due to frequent visitors.   Objective:    BP 125/89   Pulse 66   Wt 173 lb 14.4 oz (78.9 kg)   BMI 33.96 kg/m   General:  alert, cooperative, appears stated age and no distress   Breasts:  Declined  Lungs: clear to auscultation bilaterally  Heart:  regular rate and rhythm, S1, S2 normal, no murmur, click, rub or gallop  Abdomen: soft, non-tender; bowel sounds normal; no masses,  no organomegaly   Vulva:  normal  Vagina: normal vagina  Cervix:  Not examined  Corpus: normal size, contour, position, consistency, mobility, non-tender  Adnexa:  no mass, fullness, tenderness  Rectal Exam: No external hemorrhoids        Assessment:     Normal postpartum exam. Pap smear not done at today's visit.  Constipation  Plan:    1. Contraception: OCP (estrogen/progesterone) 2. IUD scholarship  application given 3. Follow up in: 1 year or as needed.   4. Miralax 1-2 x day.

## 2016-11-28 ENCOUNTER — Ambulatory Visit (INDEPENDENT_AMBULATORY_CARE_PROVIDER_SITE_OTHER): Payer: Self-pay | Admitting: Clinical

## 2016-11-28 ENCOUNTER — Ambulatory Visit: Payer: Self-pay | Admitting: *Deleted

## 2016-11-28 VITALS — BP 133/90 | HR 74

## 2016-11-28 DIAGNOSIS — F4323 Adjustment disorder with mixed anxiety and depressed mood: Secondary | ICD-10-CM

## 2016-11-28 DIAGNOSIS — O9089 Other complications of the puerperium, not elsewhere classified: Secondary | ICD-10-CM

## 2016-11-28 DIAGNOSIS — R519 Headache, unspecified: Secondary | ICD-10-CM

## 2016-11-28 DIAGNOSIS — R51 Headache: Principal | ICD-10-CM

## 2016-11-28 NOTE — Progress Notes (Signed)
Interpreter Rebeca Allegra present for encounter. Pt here today for visit with [place] Counselor and during the visit she reported having daily headaches since the birth of her baby. I spoke with pt further regarding this complaint. She did not mention the headaches @ her post partum appt on 1/22 because she thought it was normal due to the stress she has been experiencing. The headaches are not relieved with Tylenol or Ibuprofen. She endorses daily dizziness x1 week and sees black spots in her field of vision every Brecken Dewoody x1 month. Pt's last eye exam was March 2017. Per consult with Dr. Roselie Awkward, pt needs to see PCP for further evaluation. These symptoms are not pregnancy related due to delivery of baby was 7 weeks ago. Pt was advised of recommendation. Pt is an established pt of [facility]. Appt made for pt on 2/13 @ 0930. Pt advised to go to [facility] immediately if her headaches become severe or if her dizziness or visual changes become worse. She voiced understanding of all information and instructions given and agreed to plan of care.

## 2016-11-28 NOTE — BH Specialist Note (Signed)
Session Start time: 9:30   End Time: 10:30 Total Time:  60 minutes Type of Service: [place]: Yes.     Interpreter Name & Language: Baron Hamper, [place] # [facility] Visits July 2017-June 2018: 3rd  SUBJECTIVE: Hailey Walsh is a 36 y.o. female  Pt. was referred by f/u for:  anxiety and depression. Pt. reports the following symptoms/concerns: Pt states she is having headaches daily after birth of baby, is attempting to get housework done during baby's morning nap, and forgetting to eat during the day, more irritable than usual.  Duration of problem:  One month Severity: moderate Previous treatment: none  OBJECTIVE: Mood: Appropriate & Affect: Appropriate Risk of harm to self or others: No known risk of harm to self or others. 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  Pt may benefit from brief therapeutic interventions regarding coping with symptoms of anxiety and depression.   PLAN: 1. F/U with behavioral health clinician: As needed 2. Behavioral Health meds: none 3. Behavioral recommendations:  -Tell family and friends that priority will be sleep between 9-12 daily -Turn off phone sound, to limit interruptions to sleep between 9-12 daily -Sleep as priority when baby takes morning nap -Place food(ex. Granola bars, trail mix, etc.) within arms-reach of couch, to eat when baby awakes from morning nap, along with drink(water bottle, etc.) -Have blood pressure checked by nurse today -If headaches  continue, discuss with medical provider -Go dancing with husband this weekend 4. Referral: Brief Counseling/Psychotherapy and Problem-solving teaching/coping strategies 5. From scale of 1-10, how likely are you to follow plan: [place]: no Depression screen [facility] 2/9 11/28/2016 11/19/2016 10/08/2016 10/01/2016 09/19/2016  Decreased Interest 1 1 1  0 1  Down, Depressed, Hopeless 2 1 0 1 0  PHQ - 2 Score 3 2 1 1 1   Altered sleeping - 2 1 1 1   Tired, decreased energy 2 2 1 1 1   Change in appetite 2 2 1  0 1  Feeling bad or failure about yourself  2 0 0 0 0  Trouble concentrating 2 1 0 1 0  Moving slowly or fidgety/restless 1 1 0 0 0  Suicidal thoughts 0 0 0 0 0  PHQ-9 Score - 10 4 4 4   Some recent data might be hidden   GAD 7 : Generalized Anxiety Score 11/28/2016 11/19/2016 10/08/2016 10/01/2016  Nervous, Anxious, on Edge 2 1 1 1   Control/stop worrying 2 1 1 1   Worry too much - different things 2 1 1 1   Trouble relaxing 2 2 1  0  Restless 2 1 0 0  Easily annoyed or irritable 2 2 1 2   Afraid - awful might happen 2 2 1  0  Total GAD 7 Score 14 10 6 5   Anxiety Difficulty - - - -

## 2016-12-11 ENCOUNTER — Encounter: Payer: Self-pay | Admitting: Internal Medicine

## 2016-12-11 ENCOUNTER — Ambulatory Visit: Payer: Self-pay | Attending: Internal Medicine | Admitting: Internal Medicine

## 2016-12-11 VITALS — BP 133/87 | HR 64 | Temp 98.0°F | Resp 16 | Wt 175.4 lb

## 2016-12-11 DIAGNOSIS — R51 Headache: Secondary | ICD-10-CM | POA: Insufficient documentation

## 2016-12-11 DIAGNOSIS — O99345 Other mental disorders complicating the puerperium: Secondary | ICD-10-CM

## 2016-12-11 DIAGNOSIS — Z86718 Personal history of other venous thrombosis and embolism: Secondary | ICD-10-CM | POA: Insufficient documentation

## 2016-12-11 DIAGNOSIS — F418 Other specified anxiety disorders: Secondary | ICD-10-CM

## 2016-12-11 DIAGNOSIS — Z79899 Other long term (current) drug therapy: Secondary | ICD-10-CM | POA: Insufficient documentation

## 2016-12-11 DIAGNOSIS — F329 Major depressive disorder, single episode, unspecified: Secondary | ICD-10-CM | POA: Insufficient documentation

## 2016-12-11 DIAGNOSIS — R519 Headache, unspecified: Secondary | ICD-10-CM

## 2016-12-11 DIAGNOSIS — F419 Anxiety disorder, unspecified: Secondary | ICD-10-CM | POA: Insufficient documentation

## 2016-12-11 DIAGNOSIS — I10 Essential (primary) hypertension: Secondary | ICD-10-CM | POA: Insufficient documentation

## 2016-12-11 DIAGNOSIS — F32A Depression, unspecified: Secondary | ICD-10-CM

## 2016-12-11 MED ORDER — ESCITALOPRAM OXALATE 10 MG PO TABS: 10.0000 mg | ORAL_TABLET | Freq: Every day | ORAL | 1 refills | Status: DC

## 2016-12-11 MED FILL — MONO-LINYAH 28 TABLET: 0.25-35 | 28 days supply | Qty: 28 | Fill #1

## 2016-12-11 MED FILL — ?ESCITALOPRAM 10 MG TABLET: 10 | 30 days supply | Qty: 30 | Fill #0

## 2016-12-11 NOTE — Patient Instructions (Signed)
Depresión posparto y baby blues  (Postpartum Depression and Baby Blues)  El período del posparto comienza inmediatamente después del nacimiento de un bebé y suele ser una época de gran felicidad y mucho entusiasmo. También es tiempo de muchos cambios en la vida de los padres. Sin importar cuántos hijos tenga una madre, cada niño plantea nuevos desafíos y una nueva dinámica a la familia. Es frecuente que haya sentimientos de entusiasmo junto con cambios confusos en el estado de ánimo, las emociones y los pensamientos. Todas las madres corren riesgo de tener depresión posparto o "baby blues". Estos cambios en el estado de ánimo pueden presentarse inmediatamente después del parto o muchos meses después del nacimiento de un niño. El baby blues o la depresión posparto pueden ser leves o graves. Además, la depresión posparto puede desaparecer con bastante rapidez o puede ser una enfermedad de larga evolución.  CAUSAS  Se cree que el aumento de las concentraciones hormonales y su rápida disminución es la causa principal de la depresión posparto y el baby blues. Durante y después del embarazo, un número de hormonas cambian. El estrógeno y la progesterona generalmente disminuyen inmediatamente después del parto. También disminuyen con rapidez las concentraciones de la hormona tiroidea y de varios esteroides del cortisol. Otros factores que juegan un papel en estos cambios anímicos incluyen los sucesos importantes de la vida y la genética.  FACTORES DE RIESGO  Si tiene alguno de los siguientes riesgos de desarrollar baby blues o depresión posparto, sepa a qué síntomas debe estar atenta durante el período del posparto. Los factores de riesgo que pueden aumentar la probabilidad de tener baby blues o depresión posparto incluyen lo siguiente:  · Antecedentes personales o familiares de depresión.  · Depresión durante el embarazo.  · Problemas anímicos premenstruales o que guardan relación con los anticonceptivos orales.  · Mucho  estrés en la vida.  · Conflictos maritales.  · Falta de una red de apoyo social.  · Tener un bebé con necesidades especiales.  · Problemas de salud, como diabetes.  SIGNOS Y SÍNTOMAS  Los síntomas de baby blues incluyen lo siguiente:  · Cambios en el estado de ánimo durante lapsos breves, como pasar de la felicidad extrema a la tristeza.  · Falta de concentración.  · Dificultad para dormir.  · Ataques de llanto, sensibilidad emocional.  · Irritabilidad.  · Ansiedad.  Generalmente, los síntomas de depresión posparto comienzan en el término del primer mes después del parto. Estos síntomas incluyen:  · Dificultad para dormir o somnolencia excesiva.  · Pérdida de peso notable.  · Agitación.  · Sentimientos de inutilidad.  · Falta de interés en las actividades o la comida.  La psicosis posparto es una enfermedad muy grave que puede ser peligrosa. Afortunadamente, es poco frecuente. Si aparece alguno de los siguientes síntomas, se debe recibir atención médica de inmediato. Los síntomas de psicosis posparto incluyen lo siguiente:  · Alucinaciones y delirios.  · Comportamiento atípico o desorganizado.  · Confusión o desorientación.  DIAGNÓSTICO  El diagnóstico se realiza mediante la evaluación de los síntomas. No hay exámenes médicos ni pruebas de laboratorio que permitan hacer un diagnóstico, pero hay varios cuestionarios que el médico puede usar para identificar a las personas que tienen baby blues, depresión posparto o psicosis. A menudo se usa una herramienta de detección sistemática llamada [place] de depresión posnatal de [place] para diagnosticar la depresión durante el período del posparto.  TRATAMIENTO  Generalmente, el baby blues desaparece solo en el término de 1 o   porque puede  durar varios meses o ms tiempo si no se la trata. El tratamiento puede incluir terapia individual o grupal, medicamentos o ambos para abordar los factores [place], fisiolgicos y psicolgicos que pueden tener influencia en la depresin. Tambin pueden recomendarse enfticamente el ejercicio regular, una alimentacin sana, el descanso y el 86 social. La psicosis posparto es una enfermedad ms grave y requiere tratamiento inmediato. A menudo es necesaria la hospitalizacin. INSTRUCCIONES PARA EL CUIDADO EN EL HOGAR  Descanse todo lo posible. Tome una siesta cuando el beb duerme.  Haga ejercicios regularmente. Para algunas mujeres, el yoga y las caminatas son beneficiosas.  Consuma una dieta equilibrada y nutritiva.  Haga pequeas cosas que disfruta. Tome una taza de t, dese un bao de burbujas, lea su revista favorita o escuche su msica predilecta.  Evite el alcohol.  Pida ayuda con los [employer], la cocina, las compras de comida o las obligaciones diarias si lo necesita. No intente hacer todo.  Hable con personas allegadas sobre cmo se siente. Busque el apoyo de su pareja, sus familiares, sus amigos o de [employer] primerizas.  Intente pensar positivamente. Piense en aquellas cosas por las que se siente agradecida.  No pase mucho tiempo sola.  Tome solo medicamentos de venta libre o recetados, segn las indicaciones del mdico.  Cumpla con todos los controles del postparto.  Infrmele a su mdico sobre cualquier inquietud que tenga. SOLICITE ATENCIN MDICA SI: Hailey Walsh reaccin al medicamento o problemas con este. SOLICITE ATENCIN MDICA DE INMEDIATO SI:  Tiene sentimientos suicidas.  Cree que podra lastimar al beb o a Nurse, children's. ASEGRESE DE QUE:  Comprende estas instrucciones.  Controlar su afeccin.  Recibir ayuda de inmediato si no mejora o si empeora. Esta informacin no tiene Marine scientist el consejo del mdico. Asegrese de hacerle  al mdico cualquier pregunta que tenga. Document Released: 04/02/2008 Document Revised: 10/20/2013 Document Reviewed: 07/27/2013 Elsevier Interactive Patient Education  2017 [place] de ansiedad generalizada (Generalized Anxiety Disorder) El trastorno de ansiedad generalizada es un trastorno mental. Interfiere en las funciones vitales, incluyendo las [place], el trabajo y la escuela.  Es diferente de la ansiedad normal que todas las personas experimentan en algn momento de su vida en respuesta a sucesos y Chief Executive Officer. En verdad, la ansiedad normal nos ayuda a prepararnos y Brewing technologist acontecimientos y actividades de la vida. La ansiedad normal desaparece despus de que el evento o la actividad ha finalizado.  El trastorno de ansiedad generalizada no est necesariamente relacionada con eventos o actividades especficas. Tambin causa un exceso de ansiedad en proporcin a sucesos o actividades especficas. En este trastorno la ansiedad es difcil de Chief Technology Officer. Los sntomas pueden variar de leves a muy graves. Las personas que sufren de trastorno de ansiedad generalizada pueden tener intensas olas de ansiedad con sntomas fsicos (ataques de pnico).  SNTOMAS  La ansiedad y la preocupacin asociada a este trastorno son difciles de Chief Technology Officer. Esta ansiedad y la preocupacin estn relacionados con muchos eventos de la vida y sus actividades y tambin ocurre durante ms [employer] que no ocurre, durante 6 meses o ms. Las personas que la sufren pueden tener tres o ms de los siguientes sntomas (uno o ms en los nios):   Customer service manager.  Dificultades de concentracin.   Irritabilidad.  Tensin muscular  Dificultad para dormirse o sueo poco satisfactorio. DIAGNSTICO  Se diagnostica a travs de una evaluacin realizada por el mdico. El mdico  le har preguntas acerca de su estado de nimo, sntomas fsicos y sucesos de su vida. Le har preguntas  sobre su historia clnica, el consumo de alcohol o drogas, incluyendo los medicamentos recetados. [employer] un examen fsico e indicar anlisis de [place]. Ciertas enfermedades y el uso de determinadas sustancias pueden causar sntomas similares a este trastorno. Su mdico lo puede derivar a Teaching laboratory technician en salud mental para una evaluacin ms profunda.Hailey Walsh  Las terapias siguientes se utilizan en el tratamiento de este trastorno:   Medicamentos - Se recetan antidepresivos para el control diario a Barrister's clerk. Pueden indicarse tambin medicamentos para combatir la [place] graves, especialmente cuando ocurren ataques de pnico.   Terapia conversada (psicoterapia) Ciertos tipos de psicoterapia pueden ser tiles en el tratamiento del trastorno de ansiedad generalizada, proporcionando apoyo, educacin y Fish farm manager. Una forma de psicoterapia llamada terapia cognitivo-conductual puede ensearle formas saludables de pensar y Firefighter a los eventos y actividades de la vida diaria.  Tcnicasde manejo del estrs- Estas tcnicas incluyen el yoga, la meditacin y el ejercicio y pueden ser muy tiles cuando se practican con regularidad. Un especialista en salud mental puede ayudar a determinar qu tratamiento es mejor para usted. Algunas personas obtienen mejora con una terapia. Sin embargo, Producer, television/film/video requieren una combinacin de terapias.  Esta informacin no tiene Marine scientist el consejo del mdico. Asegrese de hacerle al mdico cualquier pregunta que tenga. Document Released: 02/09/2013 Document Revised: 11/05/2014 Elsevier Interactive Patient Education  2017 [employer].

## 2016-12-11 NOTE — Progress Notes (Signed)
Hailey Walsh, is a 36 y.o. female  [id number]  [id number]  DOB - 1981/08/27  Chief Complaint  Patient presents with  . Hypertension  . Headache  . Blurred Vision        Subjective:   Hailey Walsh is a 36 y.o. female here today for a follow up visit. Recent uncomplicated vag delivery 10/09/16 healthy baby girl. Since than, pt has not been sleeping well, more anxious and eating more when anxious. Denies feelings of depression though, such as being tearful/crying, denies si/hi/avh.  Does not more stressed at home.  Tries to get some rest when her baby is sleeping. She notes more frequent headaches, around temperol region, w/ visual disturbances, q3 days, sometimes last all day.  Denies photophobia, n/v/dizziness/palpitations.  Resting in quiet place helps the headaches.     Patient has  No chest pain, No abdominal pain - No Nausea, No new weakness tingling or numbness, No Cough - SOB.  Interpreter was used to communicate directly with patient for the entire encounter including providing detailed patient instructions.   No problems updated.  ALLERGIES: No Known Allergies  PAST MEDICAL HISTORY: Past Medical History:  Diagnosis Date  . Anxiety    patient denies anxiety  . Depression    no meds currently  . DVT (deep venous thrombosis) ([place]) 11/2014   right lower leg  . Incompetent cervix 2011  . SVD (spontaneous vaginal delivery)    x 1    MEDICATIONS AT HOME: Prior to Admission medications   Medication Sig Start Date End Date Taking? Authorizing Provider  acetaminophen (TYLENOL) 325 MG tablet Take 2 tablets (650 mg total) by mouth every 4 (four) hours as needed (for pain scale < 4). 10/11/16   Peggy Constant, MD  escitalopram (LEXAPRO) 10 MG tablet Take 1 tablet (10 mg total) by mouth daily. 12/11/16   Maren Reamer, MD  ibuprofen (ADVIL,MOTRIN) 600 MG tablet Take 1 tablet (600 mg total) by mouth every 6 (six) hours. 10/11/16   Manya Silvas,  CNM  norgestimate-ethinyl estradiol (SPRINTEC 28) 0.25-35 MG-MCG tablet Take 1 tablet by mouth daily. 11/19/16   Manya Silvas, CNM     Objective:   Vitals:   12/11/16 0951  BP: 133/87  Pulse: 64  Resp: 16  Temp: 98 F (36.7 C)  TempSrc: Oral  SpO2: 97%  Weight: 175 lb 6.4 oz (79.6 kg)    Exam General appearance : Awake, alert, not in any distress. Speech Clear. Not toxic looking, pleasant. Good eye contact. HEENT: Atraumatic and Normocephalic, pupils equally reactive to light. Neck: supple, no JVD.  Chest:Good air entry bilaterally, no added sounds. CVS: S1 S2 regular, no murmurs/gallups or rubs. Abdomen: Bowel sounds active, Non tender and not distended with no gaurding, rigidity or rebound. Extremities: B/L Lower Ext shows no edema, both legs are warm to touch Neurology: Awake alert, and oriented X 3, CN II-XII grossly intact, Non focal Skin:No Rash  Data Review No results found for: HGBA1C  Depression screen [facility] 2/9 12/11/2016 11/28/2016 11/19/2016 10/08/2016 10/01/2016  Decreased Interest 1 1 1 1  0  Down, Depressed, Hopeless 0 2 1 0 1  PHQ - 2 Score 1 3 2 1 1   Altered sleeping - - 2 1 1   Tired, decreased energy 1 2 2 1 1   Change in appetite 2 2 2 1  0  Feeling bad or failure about yourself  1 2 0 0 0  Trouble concentrating 1 2 1  0 1  Moving slowly or fidgety/restless  0 1 1 0 0  Suicidal thoughts 0 0 0 0 0  PHQ-9 Score - - 10 4 4   Some recent data might be hidden      Assessment & Plan   1. Anxiety and depression, post partum suspected No si/hi/avh - trial lexapro 10mg  qd - will ask sw to f/u w/ pt as well - pt currently has prenatal appt at 11am, so cannot stay longer - fu w/ me in 1 month  2. History of DVT of lower extremity Currently not on asa./anticoag.  3. Depressive episode arising in postnatal period See #1  4. Nonintractable headache, unspecified chronicity pattern, unspecified headache type Suspect related to insomnia/stressors/anxiety Trial  lexapro 10.     Patient have been counseled extensively about nutrition and exercise  Return in about 4 weeks (around 01/08/2017) for POSTPARTUM DEPRESSION/ANXIETY.  The patient was given clear instructions to go to ER or return to medical center if symptoms don't improve, worsen or new problems develop. The patient verbalized understanding. The patient was told to call to get lab results if they haven't heard anything in the next week.   This note has been created with Surveyor, quantity. Any transcriptional errors are unintentional.   Maren Reamer, MD, [place] [facility] [place], [place]   12/11/2016, 10:16 AM

## 2016-12-17 ENCOUNTER — Telehealth: Payer: Self-pay | Admitting: Licensed Clinical Social Worker

## 2016-12-17 NOTE — Telephone Encounter (Signed)
LCSWA attempted to contact pt with the assistance of [employer] [phone]) to follow up on behavioral health. Message was left for a return call.

## 2016-12-18 ENCOUNTER — Telehealth: Payer: Self-pay | Admitting: Licensed Clinical Social Worker

## 2016-12-18 NOTE — Telephone Encounter (Signed)
LCSWA attempted to contact pt with the assistance of [employer] [phone]) to follow up on behavioral health. Message was left for a return call.

## 2017-01-18 MED FILL — MONO-LINYAH 28 TABLET: 0.25-35 | 28 days supply | Qty: 28 | Fill #2

## 2017-02-13 MED FILL — MONO-LINYAH 28 TABLET: 0.25-35 | 28 days supply | Qty: 28 | Fill #3

## 2017-03-14 ENCOUNTER — Encounter: Payer: Self-pay | Admitting: Internal Medicine

## 2017-03-14 MED FILL — MONO-LINYAH 28 TABLET: 0.25-35 | 28 days supply | Qty: 28 | Fill #4

## 2017-03-15 ENCOUNTER — Encounter: Payer: Self-pay | Admitting: Internal Medicine

## 2017-03-18 ENCOUNTER — Encounter: Payer: Self-pay | Admitting: Internal Medicine

## 2017-04-12 MED FILL — MONO-LINYAH 28 TABLET: 0.25-35 | 28 days supply | Qty: 28 | Fill #5

## 2017-05-15 MED FILL — MONO-LINYAH 28 TABLET: 0.25-35 | 84 days supply | Qty: 84 | Fill #6

## 2017-07-09 ENCOUNTER — Other Ambulatory Visit: Payer: Self-pay | Admitting: Internal Medicine

## 2017-07-09 ENCOUNTER — Ambulatory Visit
Admission: RE | Admit: 2017-07-09 | Discharge: 2017-07-09 | Disposition: A | Discharge status: Home / Self Care | Payer: Self-pay | Source: Ambulatory Visit | Attending: Internal Medicine | Admitting: Internal Medicine

## 2017-07-09 DIAGNOSIS — R7612 Nonspecific reaction to cell mediated immunity measurement of gamma interferon antigen response without active tuberculosis: Secondary | ICD-10-CM

## 2018-09-11 ENCOUNTER — Other Ambulatory Visit: Payer: Self-pay | Admitting: Internal Medicine

## 2018-09-11 ENCOUNTER — Ambulatory Visit
Admission: RE | Admit: 2018-09-11 | Discharge: 2018-09-11 | Disposition: A | Discharge status: Home / Self Care | Payer: No Typology Code available for payment source | Source: Ambulatory Visit | Attending: Internal Medicine | Admitting: Internal Medicine

## 2018-09-11 DIAGNOSIS — R7612 Nonspecific reaction to cell mediated immunity measurement of gamma interferon antigen response without active tuberculosis: Secondary | ICD-10-CM

## 2018-10-16 ENCOUNTER — Other Ambulatory Visit: Payer: Self-pay

## 2018-10-16 ENCOUNTER — Emergency Department (HOSPITAL_COMMUNITY): Payer: No Typology Code available for payment source

## 2018-10-16 ENCOUNTER — Emergency Department (HOSPITAL_COMMUNITY)
Admission: EM | Admit: 2018-10-16 | Discharge: 2018-10-16 | Disposition: A | Discharge status: Home / Self Care | Payer: No Typology Code available for payment source | Attending: Emergency Medicine | Admitting: Emergency Medicine

## 2018-10-16 ENCOUNTER — Encounter (HOSPITAL_COMMUNITY): Payer: Self-pay

## 2018-10-16 DIAGNOSIS — S161XXA Strain of muscle, fascia and tendon at neck level, initial encounter: Secondary | ICD-10-CM | POA: Diagnosis not present

## 2018-10-16 DIAGNOSIS — Y92410 Unspecified street and highway as the place of occurrence of the external cause: Secondary | ICD-10-CM | POA: Insufficient documentation

## 2018-10-16 DIAGNOSIS — Y999 Unspecified external cause status: Secondary | ICD-10-CM | POA: Insufficient documentation

## 2018-10-16 DIAGNOSIS — Z87891 Personal history of nicotine dependence: Secondary | ICD-10-CM | POA: Insufficient documentation

## 2018-10-16 DIAGNOSIS — Y9389 Activity, other specified: Secondary | ICD-10-CM | POA: Diagnosis not present

## 2018-10-16 DIAGNOSIS — S3992XA Unspecified injury of lower back, initial encounter: Secondary | ICD-10-CM | POA: Diagnosis present

## 2018-10-16 DIAGNOSIS — Z79899 Other long term (current) drug therapy: Secondary | ICD-10-CM | POA: Diagnosis not present

## 2018-10-16 DIAGNOSIS — S39012A Strain of muscle, fascia and tendon of lower back, initial encounter: Secondary | ICD-10-CM | POA: Diagnosis not present

## 2018-10-16 LAB — POC URINE PREG, ED: PREG TEST UR: NEGATIVE

## 2018-10-16 MED ORDER — TRAMADOL HCL 50 MG PO TABS
50.0000 mg | ORAL_TABLET | Freq: Once | ORAL | Status: AC
  Administered 2018-10-16: 50 mg via ORAL
  Filled 2018-10-16: qty 1

## 2018-10-16 MED ORDER — CYCLOBENZAPRINE HCL 10 MG PO TABS: 10.0000 mg | ORAL_TABLET | Freq: Three times a day (TID) | ORAL | 0 refills | Status: DC | PRN

## 2018-10-16 MED ORDER — TRAMADOL HCL 50 MG PO TABS: 50.0000 mg | ORAL_TABLET | Freq: Four times a day (QID) | ORAL | 0 refills | Status: DC | PRN

## 2018-10-16 MED ORDER — IBUPROFEN 400 MG PO TABS
600.0000 mg | ORAL_TABLET | Freq: Once | ORAL | Status: AC
  Administered 2018-10-16: 22:00:00 600 mg via ORAL
  Filled 2018-10-16: qty 1

## 2018-10-16 MED ORDER — IBUPROFEN 800 MG PO TABS: 800.0000 mg | ORAL_TABLET | Freq: Three times a day (TID) | ORAL | 0 refills | Status: DC | PRN
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
## 2018-10-16 NOTE — Discharge Instructions (Addendum)
Return here as needed.  Follow-up up with an urgent care or primary care doctor.  Your x-rays did not show any abnormalities

## 2018-10-17 MED FILL — IBUPROFEN 800 MG TAB: 800 | 7 days supply | Qty: 21 | Fill #0

## 2018-10-17 MED FILL — traMADol HCL 50 MG TABS: 50 | 3 days supply | Qty: 15 | Fill #0

## 2018-10-17 MED FILL — CYCLOBENZAPRINE 10 MG TAB: 10 | 5 days supply | Qty: 15 | Fill #0
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
## 2020-02-24 ENCOUNTER — Other Ambulatory Visit: Payer: Self-pay

## 2020-02-24 ENCOUNTER — Encounter: Payer: Self-pay | Admitting: Family Medicine

## 2020-02-24 ENCOUNTER — Ambulatory Visit: Payer: Self-pay | Attending: Family Medicine | Admitting: Family Medicine

## 2020-02-24 DIAGNOSIS — R05 Cough: Secondary | ICD-10-CM

## 2020-02-24 DIAGNOSIS — Z758 Other problems related to medical facilities and other health care: Secondary | ICD-10-CM

## 2020-02-24 DIAGNOSIS — Z8611 Personal history of tuberculosis: Secondary | ICD-10-CM

## 2020-02-24 DIAGNOSIS — Z789 Other specified health status: Secondary | ICD-10-CM

## 2020-02-24 DIAGNOSIS — J312 Chronic pharyngitis: Secondary | ICD-10-CM

## 2020-02-24 DIAGNOSIS — Z603 Acculturation difficulty: Secondary | ICD-10-CM

## 2020-02-24 DIAGNOSIS — R058 Other specified cough: Secondary | ICD-10-CM

## 2020-02-24 MED ORDER — PROMETHAZINE-DM 6.25-15 MG/5ML PO SYRP: 5.0000 mL | ORAL_SOLUTION | Freq: Four times a day (QID) | ORAL | 0 refills | Status: DC | PRN

## 2020-02-24 MED ORDER — CETIRIZINE HCL 10 MG PO TABS: 10.0000 mg | ORAL_TABLET | Freq: Every day | ORAL | 3 refills | Status: DC

## 2020-02-24 MED ORDER — AZITHROMYCIN 250 MG PO TABS: ORAL_TABLET | ORAL | 0 refills | Status: DC

## 2020-02-24 MED FILL — AZITHROMYCIN 250 MG TABLET: 250 | 5 days supply | Qty: 6 | Fill #0

## 2020-02-24 MED FILL — PROMETHAZINE W/DM SYRUP: 6.25-15 | 5 days supply | Qty: 118 | Fill #0

## 2020-02-24 NOTE — Progress Notes (Signed)
Virtual Visit via Telephone Note  I connected with Hailey Walsh on 02/24/20 at 10:30 AM EDT by telephone and verified that I am speaking with the correct person using two identifiers.   I discussed the limitations, risks, security and privacy concerns of performing an evaluation and management service by telephone and the availability of in person appointments. I also discussed with the patient that there may be a patient responsible charge related to this service. The patient expressed understanding and agreed to proceed.  Patient Location: Home Provider Location: [facility] Others participating in call: call initiated by Emilio Aspen, RMA who obtained a Spanish speaking, through [place]   History of Present Illness:        39 yo female new to the practice who reports 2 months of a recurrent cough which is sometimes productive of phlegm. She has had some occasional sputum with her cough but cough is mostly dry and causes her to have a sore throat and sometimes throat pain with swallowing as well as hoarseness. Her phlegm is sometimes white or green. At the beginning of her illness she had the sensation of low-grade fever, chills and headache but this is now resolved.  She reports that she was diagnosed with tuberculosis about a year ago and is concerned that perhaps this returned.  She has tried an antibiotic which she bought in [place], she does not know the name of the medication.  The antibiotic did not help or change in symptoms.  She denies any current issues with night sweats or unexplained weight loss.  Occasional nausea with recurrent coughing.  No chest pain or palpitations.    Past Medical History:  Diagnosis Date  . Anxiety    patient denies anxiety  . Depression    no meds currently  . DVT (deep venous thrombosis) ([place]) 11/2014   right lower leg  . Incompetent cervix 2011  . SVD (spontaneous vaginal delivery)    x 1    Past Surgical  History:  Procedure Laterality Date  . ABDOMINAL SURGERY    . CERVICAL CERCLAGE  10/16/2010  . CERVICAL CERCLAGE N/A 04/24/2016   Procedure: CERCLAGE CERVICAL;  Surgeon: Donnamae Jude, MD;  Location: [facility];  Service: Gynecology;  Laterality: N/A;  . HARDWARE REMOVAL Right 05/17/2015   Procedure: HARDWARE REMOVAL RIGHT ILIAC SCREW;  Surgeon: Altamese Perezville, MD;  Location: [facility];  Service: Orthopedics;  Laterality: Right;  . ORIF WRIST FRACTURE Left 12/17/2014   Procedure: OPEN REDUCTION INTERNAL FIXATION (ORIF) WRIST FRACTURE;  Surgeon: Roseanne Kaufman, MD;  Location: [facility];  Service: Orthopedics;  Laterality: Left;  . SACRO-ILIAC PINNING Left 12/17/2014   Procedure: [place] M[place];  Surgeon: Rozanna Box, MD;  Location: [facility];  Service: Orthopedics;  Laterality: Left;    Family History  Problem Relation Age of Onset  . Cancer Mother   . Diabetes Brother     Social History   Tobacco Use  . Smoking status: Former Smoker    Types: Cigarettes    Quit date: 10/30/2015    Years since quitting: 4.3  . Smokeless tobacco: Never Used  . Tobacco comment: Intermittent smoker-occasional  Substance Use Topics  . Alcohol use: No  . Drug use: No     No Known Allergies     Observations/Objective: No vital signs or physical exam conducted as visit was done via telephone  Assessment and Plan: 1. Recurrent cough Due to her history of TB and cough for 2 months, as  well as prior history of smoking, and production of green and white sputum, patient has been asked to obtain chest x-ray within the next 7 days.  She has been placed on azithromycin which would treat an atypical pneumonia and as she also complains of nasal congestion she has been prescribed Zyrtec to take at bedtime to help with postnasal drainage which may be contributing to her sore throat.  Prescription is also being provided for promethazine with guaifenesin to help with recurrent cough. - DG Chest 2 View; Future - azithromycin  (ZITHROMAX) 250 MG tablet; Take 2 pills today then 1 pill daily for 4 days  Dispense: 6 tablet; Refill: 0 - cetirizine (ZYRTEC) 10 MG tablet; Take 1 tablet (10 mg total) by mouth at bedtime. As needed for nasal congestion  Dispense: 30 tablet; Refill: 3 - promethazine-dextromethorphan (PROMETHAZINE-DM) 6.25-15 MG/5ML syrup; Take 5 mLs by mouth 4 (four) times daily as needed for cough.  Dispense: 118 mL; Refill: 0  2. History of TB (tuberculosis) Patient reports prior diagnosis of TB and she reports completion of treatment through the health department.  - DG Chest 2 View; Future  3. Chronic sore throat Complaint of sore throat for the past 2 months along with cough. Will treat patient with azithromycin to cover strep as well as atypical pneumonia and zyrtec to help with cough.  - azithromycin (ZITHROMAX) 250 MG tablet; Take 2 pills today then 1 pill daily for 4 days  Dispense: 6 tablet; Refill: 0 - cetirizine (ZYRTEC) 10 MG tablet; Take 1 tablet (10 mg total) by mouth at bedtime. As needed for nasal congestion  Dispense: 30 tablet; Refill: 3  4.  Language barrier Spanish-speaking interpreter through [facility] interpretation services was used at today's visit to help with language barrier to prevent miscommunication of health information  Follow Up Instructions:Return in about 2 weeks (around 03/09/2020) for Recurrent cough-sooner if needed, ED if symptoms acutely worsen.    I discussed the assessment and treatment plan with the patient. The patient was provided an opportunity to ask questions and all were answered. The patient agreed with the plan and demonstrated an understanding of the instructions.    The patient was advised to call back or seek an in-person evaluation if the symptoms worsen or if the condition fails to improve as anticipated.  I provided 24 minutes of non-face-to-face time during this encounter.   Antony Blackbird, MD

## 2020-02-24 NOTE — Progress Notes (Signed)
Est care  Cough, throat pain and weakness and fatigue  Came up positive for tuberculosis

## 2020-02-25 ENCOUNTER — Ambulatory Visit (HOSPITAL_COMMUNITY)
Admission: RE | Admit: 2020-02-25 | Discharge: 2020-02-25 | Disposition: A | Discharge status: Home / Self Care | Payer: Self-pay | Source: Ambulatory Visit | Attending: Family Medicine | Admitting: Family Medicine

## 2020-02-25 DIAGNOSIS — R058 Other specified cough: Secondary | ICD-10-CM

## 2020-02-25 DIAGNOSIS — Z8611 Personal history of tuberculosis: Secondary | ICD-10-CM | POA: Insufficient documentation

## 2020-02-25 DIAGNOSIS — R05 Cough: Secondary | ICD-10-CM | POA: Insufficient documentation

## 2020-03-03 ENCOUNTER — Telehealth: Payer: Self-pay | Admitting: Family Medicine

## 2020-03-03 NOTE — Telephone Encounter (Signed)
Patient returned call regarding x-ray results. Please f/u

## 2020-03-08 ENCOUNTER — Encounter: Payer: Self-pay | Admitting: *Deleted

## 2020-03-08 NOTE — Telephone Encounter (Signed)
LMOM for patient to call back for results of her xray

## 2020-03-16 ENCOUNTER — Ambulatory Visit: Payer: Self-pay | Admitting: Family Medicine

## 2020-04-04 ENCOUNTER — Ambulatory Visit: Payer: Self-pay | Admitting: Family Medicine

## 2020-04-18 NOTE — Progress Notes (Signed)
Virtual Visit via Telephone Note  I connected with Hailey Walsh, on 04/21/2020 at 4:30 PM by telephone due to the COVID-19 pandemic and verified that I am speaking with the correct person using two identifiers.  Due to current restrictions/limitations of in-office visits due to the COVID-19 pandemic, this scheduled clinical appointment was converted to a telehealth visit.   Consent: I discussed the limitations, risks, security and privacy concerns of performing an evaluation and management service by telephone and the availability of in person appointments. I also discussed with the patient that there may be a patient responsible charge related to this service. The patient expressed understanding and agreed to proceed.  Location of Patient: Home  Location of Provider: [facility]  Persons participating in Telemedicine visit: Viviene Thurston Azhar Yogi Minette Brine, NP Orlan Leavens, [place]  History of Present Illness:  Patient ID: Hailey Walsh, female    DOB: [birth year]  MRN: [id number]  CC: Recurrent cough follow-up  Subjective: Hailey Walsh is a 39 y.o. female with history of DVT of lower extremity who presents for recurrent cough follow-up.  1. COUGH FOLLOW-UP: Last visit 02/24/2020 with Dr. Chapman Fitch. During that encounter due to patient's history of TB and cough for 2 months, prior history of smoking, and production of green and white sputum, patient was asked to obtain chest x-ray within 7 days. Placed on Azithromycin which would treat atypical pneumonia as she also complains of nasal congestion she was prescribed Zyrtec to take at bedtime to help with postnasal drainage which may be contributing to sore throat. Prescription provided for Promethazine with Guaifenesin to help with recurrent cough.  Today reports cough has returned productive of green sputum, sore throat, and a hoarse voice.  Reports she still has some Zyrtec and Guaifenesin  which she is still taking and that it doesn't help much. Reports she completed prescriptions for both Azithromycin and Promethazine. Reports initially her symptoms improved after last being seen in April. Today reports the symptoms have returned and she feels almost like she did when she was last seen.  Reports she only has chest pain when she has a coughing attack otherwise no chest pain. Denies chest tightness, palpitations, shortness of breath, wheezing, acid reflux, and postnasal drainage.  2. SORE THROAT FOLLOW-UP: Last visit 02/24/2020 with Dr. Chapman Fitch. During that encounter patient complained of sore throat along with cough. Patient treated with Azithromycin to cover strep as well as atypical pneumonia and Zyrtec to help with cough.  Today reports sore throat has returned. Reports concerns for what appears to be pimples on upper throat that comes and goes. Reports today she doesn't see any pimples on her throat. Denies neck swelling, neck tenderness, difficulty swallowing, and difficulty breathing.     Past Medical History:  Diagnosis Date  . Anxiety    patient denies anxiety  . Depression    no meds currently  . DVT (deep venous thrombosis) ([place]) 11/2014   right lower leg  . Incompetent cervix 2011  . SVD (spontaneous vaginal delivery)    x 1   No Known Allergies  Current Outpatient Medications on File Prior to Visit  Medication Sig Dispense Refill  . acetaminophen (TYLENOL) 325 MG tablet Take 2 tablets (650 mg total) by mouth every 4 (four) hours as needed (for pain scale < 4). 30 tablet 0  . azithromycin (ZITHROMAX) 250 MG tablet Take 2 pills today then 1 pill daily for 4 days 6 tablet 0  . cetirizine (ZYRTEC) 10 MG tablet Take 1  tablet (10 mg total) by mouth at bedtime. As needed for nasal congestion 30 tablet 3  . cyclobenzaprine (FLEXERIL) 10 MG tablet Take 1 tablet (10 mg total) by mouth 3 (three) times daily as needed for muscle spasms. (Patient not taking: Reported on  02/24/2020) 15 tablet 0  . escitalopram (LEXAPRO) 10 MG tablet Take 1 tablet (10 mg total) by mouth daily. (Patient not taking: Reported on 02/24/2020) 30 tablet 1  . ibuprofen (ADVIL,MOTRIN) 800 MG tablet Take 1 tablet (800 mg total) by mouth every 8 (eight) hours as needed. (Patient not taking: Reported on 02/24/2020) 21 tablet 0  . norgestimate-ethinyl estradiol (SPRINTEC 28) 0.25-35 MG-MCG tablet Take 1 tablet by mouth daily. (Patient not taking: Reported on 02/24/2020) 1 Package 12  . promethazine-dextromethorphan (PROMETHAZINE-DM) 6.25-15 MG/5ML syrup Take 5 mLs by mouth 4 (four) times daily as needed for cough. 118 mL 0  . traMADol (ULTRAM) 50 MG tablet Take 1 tablet (50 mg total) by mouth every 6 (six) hours as needed for severe pain. (Patient not taking: Reported on 02/24/2020) 15 tablet 0   No current facility-administered medications on file prior to visit.    Observations/Objective: Alert and oriented x 3. Not in acute distress. Physical examination not completed as this is a telemedicine visit.  Assessment and Plan: 1. Recurrent cough: -Patient today with recurrent cough.  -Patient had encounter on 02/24/2020 with her primary physician for the same reason. During that encounter due to patient's history of TB and cough for 2 months, prior history of smoking, and production of green and white sputum, patient was asked to obtain chest x-ray within 7 days. Placed on Azithromycin which would treat atypical pneumonia as she also complained of nasal congestion she was prescribed Zyrtec to take at bedtime to help with postnasal drainage which may be contributing to sore throat. Prescription provided for Promethazine with Guaifenesin to help with recurrent cough. -Chest x-ray on 02/24/2020 resulted with subsegmental atelectasis or scarring in the lingula. No confluent consolidation. Subcentimeter right upper lobe nodule on 2016 CT is tentatively visualized on the lateral view. -Today reports cough has  returned productive of green sputum accompanied by sore throat and hoarse voice. Reports she has completed Azithromycin and Promethazine previously prescribed. Reports still taking Zyrtec and Guaifenesin but that it doesn't help much.Reports chest pain only with coughing attack. Denies chest tightness, palpitations, shortness of breath, wheezing, acid reflux, and postnasal drainage. -Patient reports she does have history of tuberculosis 4 years ago. Reports she received and completed treatment for this at [facility]. -Referral to pulmonologist Dr. Joya Gaskins for further evaluation and management. Patient should make appointment for this. Patient agreeable.  -Patient was given clear instructions to go to Emergency Department or return to medical center if symptoms don't improve, worsen, or new problems develop.The patient verbalized understanding. - Consult to pulmonology   2. Chronic sore throat: -Patient today with chronic sore throat.  -Last visit 02/24/2020 with Dr. Chapman Fitch. During that encounter patient complained of sore throat along with cough. Patient treated with Azithromycin to cover strep as well as atypical pneumonia and Zyrtec to help with cough. -Today reports sore throat has returned. Reports concerns for what appears to be pimples on upper throat that comes and goes. Reports today she doesn't see any pimples on her throat. Denies neck swelling, neck tenderness, difficulty swallowing, and difficulty breathing. -Referral to pulmonologist Dr. Joya Gaskins for further evaluation and management. Patient should make appointment for this. Patient agreeable.  -Patient was given  clear instructions to go to Emergency Department or return to medical center if symptoms don't improve, worsen, or new problems develop.The patient verbalized understanding. - Consult to pulmonology   3. History of TB (tuberculosis): -Patient reports she had tuberculosis 4 years ago and received and  completed treatment for this at the [facility] Department.  4. Language barrier: -[employer] participated during this call.  -Interpreter Name: Shanon Brow, ID#:  [id number] -Interpreter Name:  Hassell Done, ID#: [id number]   Follow Up Instructions: Patient was given clear instructions to go to Emergency Department or return to medical center if symptoms don't improve, worsen, or new problems develop.The patient verbalized understanding.  I discussed the assessment and treatment plan with the patient. The patient was provided an opportunity to ask questions and all were answered. The patient agreed with the plan and demonstrated an understanding of the instructions.   The patient was advised to call back or seek an in-person evaluation if the symptoms worsen or if the condition fails to improve as anticipated.  I provided 15 minutes total of non-face-to-face time during this encounter including median intraservice time, reviewing previous notes, labs, imaging, medications, management and patient verbalized understanding.    Camillia Herter, NP  [facility] [place], [place]   04/22/2020, 8:06 AM

## 2020-04-21 ENCOUNTER — Ambulatory Visit: Payer: Self-pay | Attending: Family Medicine | Admitting: Family

## 2020-04-21 ENCOUNTER — Other Ambulatory Visit: Payer: Self-pay

## 2020-04-21 DIAGNOSIS — J312 Chronic pharyngitis: Secondary | ICD-10-CM

## 2020-04-21 DIAGNOSIS — Z789 Other specified health status: Secondary | ICD-10-CM

## 2020-04-21 DIAGNOSIS — R05 Cough: Secondary | ICD-10-CM

## 2020-04-21 DIAGNOSIS — Z8611 Personal history of tuberculosis: Secondary | ICD-10-CM

## 2020-04-21 DIAGNOSIS — R058 Other specified cough: Secondary | ICD-10-CM

## 2020-04-22 NOTE — Patient Instructions (Signed)
Referral to pulmonologist Dr. Joya Gaskins for further evaluation and management. Report to emergency department immediately if symptoms worsen and/or become severe. Tos en los adultos Cough, Adult La tos ayuda a despejar la garganta y los pulmones. La tos puede ser un signo de una enfermedad u otra afeccin mdica. Una tos aguda puede durar [employer] 2 o 3 semanas, mientras que una tos crnica puede durar 8semanas o ms tiempo. Hay muchas cosas que pueden causar tos. Estas incluyen lo siguiente:  Grmenes (virus o bacterias) que atacan las vas respiratorias.  Inhalacin de cosas que alteran (irritan) los pulmones.  Alergias.  Asma.  Mucosidad que se desliza por la parte posterior de la garganta (goteo posnasal).  Fumar.  cido que vuelve desde el estmago hacia el tubo que transporta los alimentos desde la boca hasta el estmago (reflujo gastroesofgico).  Algunos medicamentos.  Problemas pulmonares.  Otras enfermedades, como insuficiencia cardaca o un cogulo de sangre en el pulmn (embolia pulmonar). Siga estas instrucciones en su casa: Medicamentos  Tome los medicamentos de venta libre y los recetados solamente como se lo haya indicado el mdico.  Hable con el mdico antes de tomar medicamentos que detienen la tos (antitusivos). Estilo de vida   No fume y trate de no estar cerca de humo. No consuma ningn producto que contenga nicotina o tabaco, como cigarrillos, cigarrillos electrnicos y tabaco de Higher education careers adviser. Si necesita ayuda para dejar de fumar, consulte al mdico.  Beba suficiente lquido para mantener el pis (la [place]) de color amarillo plido.  Evite la cafena.  No beba alcohol si el mdico se lo prohbe. Instrucciones generales   Fjese si hay algn cambio en la tos. Informe a su mdico sobre ellos.  Siempre cbrase la boca al toser.  Aljese de las cosas que lo hagan toser, como perfumes, velas, humo de fogatas o productos de limpieza.  Si el aire es [employer],  use un humidificador o un vaporizador de niebla fra en su hogar.  Si la tos empeora por la noche, pruebe con usar almohadas adicionales para elevar la cabeza mientras duerme.  Descanse todo lo que sea necesario.  Concurra a todas las visitas de seguimiento como se lo haya indicado el mdico. Esto es importante. Comunquese con un mdico si:  Aparecen nuevos sntomas.  Tose y escupe pus.  La tos no mejora despus de 2 o 3semanas o empeora.  Los medicamentos para la tos no [employer] tos y usted no duerme bien.  Siente un dolor que empeora o un dolor que no se alivia con medicamentos.  Tiene fiebre.  Est adelgazando y no sabe por qu.  Transpira durante la noche. Solicite ayuda inmediatamente si:  Tose y [employer].  Tiene dificultad para respirar.  El [employer] late [place] rpido. Estos sntomas pueden Sales executive. No espere a ver si los sntomas desaparecen. Solicite atencin mdica de inmediato. Comunquese con el servicio de emergencias de su localidad (911 en los [place]). No conduzca por sus propios medios [employer]. Resumen  La tos ayuda a despejar la garganta y los pulmones. Hay muchas cosas que pueden causar tos.  Tome los medicamentos de venta libre y los recetados solamente como se lo haya indicado el mdico.  Siempre cbrase la boca al toser.  Comunquese con un mdico si tiene sntomas nuevos o tiene una tos que no mejora o que [place]. Esta informacin no tiene Marine scientist el consejo del mdico. Asegrese de hacerle al mdico cualquier pregunta que  tenga. Document Revised: 12/10/2018 Document Reviewed: 12/10/2018 Elsevier Patient Education  [place].

## 2020-05-31 ENCOUNTER — Ambulatory Visit: Payer: Self-pay | Admitting: Critical Care Medicine

## 2020-07-05 ENCOUNTER — Ambulatory Visit: Payer: Self-pay | Admitting: Critical Care Medicine

## 2020-08-15 NOTE — Progress Notes (Signed)
Subjective:    Patient ID: Hailey Walsh, female    DOB: January 27, 1981, 39 y.o.   MRN: [id number]  39 y.o.F ref by PCP [facility] for chronic cough  08/16/20 This patient has a 1 year history of cyclic cough.  She states antibiotics and cough suppressants will help.  She notes increased nasal congestion and drainage.  She was also treated 4 years ago with isoniazid for 6 months when she immigrated from [place] to the [place].  She had a positive TB skin test with a negative chest x-ray.  Note in April of this year a chest x-ray was performed and showed left upper lobe nodule.  See cough assessment below The patient is a non-smoker  Cough This is a chronic problem. The current episode started more than 1 year ago. The problem has been rapidly improving. The cough is productive of purulent sputum (when was coughing, mucus was white to green). Associated symptoms include chest pain, ear congestion, ear pain, a sore throat and shortness of breath. Pertinent negatives include no chills, fever, headaches, heartburn, hemoptysis, myalgias, nasal congestion, postnasal drip, rash, rhinorrhea, sweats, weight loss or wheezing. Associated symptoms comments: emesis. The symptoms are aggravated by cold air. She has tried prescription cough suppressant (antibiotics) for the symptoms. The treatment provided significant relief. There is no history of asthma.   Past Medical History:  Diagnosis Date  . Anxiety    patient denies anxiety  . Depression    no meds currently  . DVT (deep venous thrombosis) ([place]) 11/2014   right lower leg  . Incompetent cervix 2011  . SVD (spontaneous vaginal delivery)    x 1     Family History  Problem Relation Age of Onset  . Cancer Mother   . Diabetes Brother      Social History   Socioeconomic History  . Marital status: Single    Spouse name: Not on file  . Number of children: Not on file  . Years of education: Not on file  . Highest education level: Not  on file  Occupational History  . Not on file  Tobacco Use  . Smoking status: Former Smoker    Types: Cigarettes    Quit date: 10/30/2015    Years since quitting: 4.8  . Smokeless tobacco: Never Used  . Tobacco comment: Intermittent smoker-occasional  Substance and Sexual Activity  . Alcohol use: No  . Drug use: No  . Sexual activity: Not Currently    Birth control/protection: None  Other Topics Concern  . Not on file  Social History Narrative  . Not on file   Social Determinants of Health   Financial Resource Strain:   . Difficulty of Paying Living Expenses: Not on file  Food Insecurity:   . Worried About Charity fundraiser in the Last Year: Not on file  . Ran Out of Food in the Last Year: Not on file  Transportation Needs:   . Lack of Transportation (Medical): Not on file  . Lack of Transportation (Non-Medical): Not on file  Physical Activity:   . Days of Exercise per Week: Not on file  . Minutes of Exercise per Session: Not on file  Stress:   . Feeling of Stress : Not on file  Social Connections:   . Frequency of Communication with Friends and Family: Not on file  . Frequency of Social Gatherings with Friends and Family: Not on file  . Attends Religious Services: Not on file  . Active Member of  Clubs or Organizations: Not on file  . Attends Archivist Meetings: Not on file  . Marital Status: Not on file  Intimate Partner Violence:   . Fear of Current or Ex-Partner: Not on file  . Emotionally Abused: Not on file  . Physically Abused: Not on file  . Sexually Abused: Not on file     No Known Allergies   Outpatient Medications Prior to Visit  Medication Sig Dispense Refill  . acetaminophen (TYLENOL) 325 MG tablet Take 2 tablets (650 mg total) by mouth every 4 (four) hours as needed (for pain scale < 4). 30 tablet 0  . cetirizine (ZYRTEC) 10 MG tablet Take 1 tablet (10 mg total) by mouth at bedtime. As needed for nasal congestion 30 tablet 3  . ibuprofen  (ADVIL,MOTRIN) 800 MG tablet Take 1 tablet (800 mg total) by mouth every 8 (eight) hours as needed. (Patient not taking: Reported on 02/24/2020) 21 tablet 0  . azithromycin (ZITHROMAX) 250 MG tablet Take 2 pills today then 1 pill daily for 4 days (Patient not taking: Reported on 08/16/2020) 6 tablet 0  . cyclobenzaprine (FLEXERIL) 10 MG tablet Take 1 tablet (10 mg total) by mouth 3 (three) times daily as needed for muscle spasms. (Patient not taking: Reported on 02/24/2020) 15 tablet 0  . escitalopram (LEXAPRO) 10 MG tablet Take 1 tablet (10 mg total) by mouth daily. (Patient not taking: Reported on 02/24/2020) 30 tablet 1  . norgestimate-ethinyl estradiol (SPRINTEC 28) 0.25-35 MG-MCG tablet Take 1 tablet by mouth daily. (Patient not taking: Reported on 02/24/2020) 1 Package 12  . promethazine-dextromethorphan (PROMETHAZINE-DM) 6.25-15 MG/5ML syrup Take 5 mLs by mouth 4 (four) times daily as needed for cough. (Patient not taking: Reported on 08/16/2020) 118 mL 0  . traMADol (ULTRAM) 50 MG tablet Take 1 tablet (50 mg total) by mouth every 6 (six) hours as needed for severe pain. (Patient not taking: Reported on 02/24/2020) 15 tablet 0   No facility-administered medications prior to visit.     Review of Systems  Constitutional: Negative for chills, fever and weight loss.  HENT: Positive for ear pain and sore throat. Negative for postnasal drip and rhinorrhea.   Respiratory: Positive for cough and shortness of breath. Negative for hemoptysis and wheezing.   Cardiovascular: Positive for chest pain.  Gastrointestinal: Negative for heartburn.  Musculoskeletal: Negative for myalgias.  Skin: Negative for rash.  Neurological: Negative for headaches.       Objective:   Physical Exam Vitals:   08/16/20 1535  BP: 122/83  Pulse: 85  Temp: 97.7 F (36.5 C)  SpO2: (!) 89%  Weight: 170 lb 9.6 oz (77.4 kg)    Gen: Pleasant, well-nourished, in no distress,  normal affect  ENT: Bilateral nasal  turbinate edema without purulence mouth clear,  oropharynx clear, 3+ postnasal drip  Neck: No JVD, no TMG, no carotid bruits  Lungs: No use of accessory muscles, no dullness to percussion, clear without rales or rhonchi  Cardiovascular: RRR, heart sounds normal, no murmur or gallops, no peripheral edema  Abdomen: soft and NT, no HSM,  BS normal  Musculoskeletal: No deformities, no cyanosis or clubbing  Neuro: alert, non focal  Skin: Warm, no lesions or rashes  No results found.        Assessment & Plan:  I personally reviewed all images and lab data in the [facility] system as well as any outside material available during this office visit and agree with the  radiology impressions.   Cough  variant asthma Cyclic recurrent cough likely in the basis of asthma with increased nasal inflammation noted  Plan is to begin Flonase 2 sprays each nostril daily cetirizine daily albuterol as needed she was instructed as to proper use of the [facility] inhaler  No systemic steroids needed  Follow-up chest x-ray  Pulmonary nodule Pulmonary nodule seen on chest x-ray in April will follow up with current x-ray at this time given history of positive PPD given isoniazid   Eulonda was seen today for follow-up.  Diagnoses and all orders for this visit:  Cough variant asthma  Recurrent cough -     cetirizine (ZYRTEC) 10 MG tablet; Take 1 tablet (10 mg total) by mouth at bedtime. As needed for nasal congestion -     DG Chest 2 View; Future  Chronic sore throat -     cetirizine (ZYRTEC) 10 MG tablet; Take 1 tablet (10 mg total) by mouth at bedtime. As needed for nasal congestion  Pulmonary nodule -     DG Chest 2 View; Future  Other orders -     Albuterol Sulfate (PROAIR RESPICLICK) 270 (90 Base) MCG/ACT AEPB; Inhale 2 puffs into the lungs every 6 (six) hours as needed. -     fluticasone (FLONASE) 50 MCG/ACT nasal spray; Place 2 sprays into both nostrils daily.

## 2020-08-16 ENCOUNTER — Encounter: Payer: Self-pay | Admitting: Critical Care Medicine

## 2020-08-16 ENCOUNTER — Ambulatory Visit: Payer: Self-pay | Attending: Critical Care Medicine | Admitting: Critical Care Medicine

## 2020-08-16 ENCOUNTER — Other Ambulatory Visit: Payer: Self-pay | Admitting: Critical Care Medicine

## 2020-08-16 ENCOUNTER — Other Ambulatory Visit: Payer: Self-pay

## 2020-08-16 VITALS — BP 122/83 | HR 85 | Temp 97.7°F | Wt 170.6 lb

## 2020-08-16 DIAGNOSIS — R911 Solitary pulmonary nodule: Secondary | ICD-10-CM | POA: Insufficient documentation

## 2020-08-16 DIAGNOSIS — J312 Chronic pharyngitis: Secondary | ICD-10-CM

## 2020-08-16 DIAGNOSIS — R058 Other specified cough: Secondary | ICD-10-CM

## 2020-08-16 DIAGNOSIS — J45991 Cough variant asthma: Secondary | ICD-10-CM | POA: Insufficient documentation

## 2020-08-16 MED ORDER — PROAIR RESPICLICK 108 (90 BASE) MCG/ACT IN AEPB: 2.0000 | INHALATION_SPRAY | Freq: Four times a day (QID) | RESPIRATORY_TRACT | 4 refills | Status: DC | PRN

## 2020-08-16 MED ORDER — FLUTICASONE PROPIONATE 50 MCG/ACT NA SUSP: 2.0000 | Freq: Every day | NASAL | 6 refills | Status: DC

## 2020-08-16 MED ORDER — CETIRIZINE HCL 10 MG PO TABS: 10.0000 mg | ORAL_TABLET | Freq: Every day | ORAL | 3 refills | Status: DC

## 2020-08-16 MED FILL — CETIRIZINE HCL 10 MG TABS: 10 | 30 days supply | Qty: 30 | Fill #0

## 2020-08-16 MED FILL — PROAIR RESPICLICK INHAL PWD: 108 (90 BAS | 25 days supply | Qty: 1 | Fill #0

## 2020-08-16 MED FILL — FLUTICASONE PROP 50 MCG SPR: 50 | 30 days supply | Qty: 16 | Fill #0

## 2020-08-16 NOTE — Assessment & Plan Note (Signed)
Cyclic recurrent cough likely in the basis of asthma with increased nasal inflammation noted  Plan is to begin Flonase 2 sprays each nostril daily cetirizine daily albuterol as needed she was instructed as to proper use of the HFA inhaler  No systemic steroids needed  Follow-up chest x-ray

## 2020-08-16 NOTE — Patient Instructions (Signed)
You likely have a form of asthma  Begin Flonase 2 sprays each nostril daily  Resume Zyrtec 1 pill daily  Use albuterol 2 puffs every 6 hours as needed for shortness of breath  Obtain a chest x-ray you do not need an appointment  Return to see Dr. Joya Gaskins in 3 weeks   Cmo usar un inhalador con medidor de dosis How to Use a Metered Dose Inhaler Un inhalador con medidor de dosis es un dispositivo porttil para tomar medicamentos que se deben Occupational psychologist los pulmones (inhalar). El dispositivo se puede usar para Architectural technologist una variedad de medicamentos inhalados, entre los que se incluyen:  Medicamentos de rescate o de alivio rpido, como broncodilatadores.  Medicamentos de control, como corticoesteroides. El medicamento se administra presionando el contenedor metlico para Financial controller una cantidad predeterminada de aerosol y [place]. Cada dispositivo contiene la cantidad de medicamento necesario para un nmero establecido de usos (inhalaciones). El mdico puede recomendarle que use un espaciador con el inhalador para ayudarlo a que reciba el medicamento ms eficazmente. Un espaciador es un tubo plstico con una boquilla en un extremo y Ardelia Mems abertura que lo conecta al inhalador en el otro extremo. Un espaciador retiene brevemente el medicamento en un conducto, lo que le permite inhalar ms medicamento. Cules son los riesgos? Si no [place] el [place], es posible que el medicamento no llegue a los pulmones para ayudarle a Ambulance person. Los medicamentos inhalados pueden tener efectos secundarios, por ejemplo:  Infeccin en la boca o la garganta.  Tos.  [place].  Dolor de [place].  Nuseas y vmitos.  Infeccin pulmonar (neumona) en personas que tienen una afeccin pulmonar llamada EPOC. Cmo usar un inhalador con medidor de dosis sin un espaciador  1. Retire la tapa del inhalador. 2. Si es la primera vez que [place] el [place], agtelo durante 5 segundos y dispare 4  descargas al aire lejos del rostro. Esto se llama imprimacin. 3. Sacuda el inhalador durante 5segundos. 4. Coloque el inhalador de [employer] parte superior del envase quede [place]. 5. Coloque el dedo ndice en la parte superior del envase del medicamento. Sostenga la parte inferior del inhalador con su pulgar. 6. Exhale normalmente todo el aire que pueda, alejado del [place]. 7. Coloque el inhalador [employer] dientes y apriete los labios fuertemente alrededor de la boquilla, o sostenga el inhalador a una distancia de 1 a 2pulgadas (2,5 a 5cm) de la boca abierta. Mantenga la lengua baja despejando el paso. Si no est seguro de Copywriter, advertising, consulte a su mdico. 8. Presione el contenedor hacia abajo con el dedo ndice para Product/process development scientist, luego inhale lenta y profundamente por la boca (no por la Lawyer) hasta que los pulmones estn completamente llenos. La inhalacin debe llevarle de 4 a 6segundos. 9. Mantenga el medicamento en los pulmones entre 5 y 10segundos (10segundos es lo ms indicado). Esto ayuda a que el medicamento ingrese en las vas respiratorias pequeas y en los pulmones. 10. Con los labios en forma de crculo apretado (fruncidos) exhale lentamente. 11. Repita los pasos 3 al 10 hasta que se haya administrado la cantidad de descargas que el mdico le indic. Espere al menos 41mnuto entre una descarga y otra o segn las indicaciones. 1[place]en el inhalador. 130 Si utiliza un inhalador con corticoesteroides, enjuague su boca con agua, haga grgaras y escupa el agua. Notrague el agua. Cmo usar uEducational psychologistde dosis medida con espaciador  1. Retire la tapa del inhalador.  2. Si es la primera vez que [place] el [place], agtelo durante 5 segundos y dispare 4 descargas al aire lejos del rostro. Esto se llama imprimacin. 3. Sacuda el inhalador durante 5segundos. 4. Coloque el extremo abierto del Geographical information systems officer en la boquilla del inhalador. 5. Coloque el  inhalador de modo que la parte superior del envase quede hacia arriba y la boquilla del espaciador delante suyo. 6. Coloque el dedo ndice en la parte superior del envase del medicamento. Sostenga la parte inferior del inhalador y Arts development officer con su pulgar. 7. Exhale normalmente todo el aire que pueda, lejos del espaciador. 8. Coloque el espaciador [employer] dientes y apriete los labios firmemente a su alrededor. Mantenga la lengua baja despejando el paso. 9. Presione el contenedor hacia abajo con el dedo ndice para Product/process development scientist, luego inhale lenta y profundamente por la boca (no por la Lawyer) hasta que los pulmones estn completamente llenos. La inhalacin debe llevarle de 4 a 6segundos. 10. Mantenga el medicamento en los pulmones entre 5 y 10segundos (10segundos es lo ms indicado). Esto ayuda a que el medicamento ingrese en las vas respiratorias pequeas y en los pulmones. 11. Con los labios en forma de crculo apretado (fruncidos) exhale lentamente. 12. Repita los pasos 3 al 11 hasta que se haya administrado la cantidad de descargas que el mdico le indic. Espere al menos 10mnuto entre una descarga y otra o segn las indicaciones. 141 Retire el espaciador del inhalador y colquele la tapa al iTax inspector 189 Si utiliza un inhalador con corticoesteroides, enjuague su boca con agua, haga grgaras y escupa el agua. Notrague el agua. Siga estas indicaciones en su casa:  Tome el medicamento para iAdvice workersolo como se lo haya indicado el mdico. No use el inhalador ms de lo que su mdico le indique.  Concurra a todas las visitas de seguimiento como se lo haya indicado el mdico. Esto es importante.  Si el inhalador tiene uAdministrator, arts puede controlarlo para saber la cantidad de mAT&T Si el inhalador no tiene cScientist, physiological pdale a su mdico que lo ayude a dSoil scientista llenarlo y anote la fecha de reposicin en un calendario o en el  envase del inhalador. Tenga en cuenta que no puede saber cundo un inhalador est vaco al sacudirlo.  Siga las indicaciones del prospecto que trae el envase para el cuidado y la limpieza del iTax inspectory del eGeographical information systems officer Comunquese con un mdico si:  Los sntomas solo se aEngineer, miningcon eForensic psychologist  Tiene dificultad para uWater quality scientist  Experimenta un aumento de la flema.  Tiene dolores de c[place] Solicite ayuda de inmediato si:  [place] alivio despus de uWater quality scientist  Tiene mareos.  Tiene la frecuencia cardaca acelerada.  Tiene escalofros o fiebre.  Tiene transpiracin nocturna.  Hay sangre en la flema. Resumen  Un inhalador con medidor de dosis es un dispositivo porttil para tomar medicamentos que se deben aOccupational psychologistlos pulmones (inhalar).  El medicamento se administra presionando el contenedor metlico para lFinancial controlleruna cantidad predeterminada de aerosol y m[place]  Cada dispositivo contiene la cantidad de medicamento necesario para un nmero establecido de usos (inhalaciones). Esta informacin no tiene cMarine scientistel consejo del mdico. Asegrese de hacerle al mdico cualquier pregunta que tenga. Document Revised: 06/11/2017 Document Reviewed: 05/06/2017 Elsevier Patient Education  2020 ETrucksvilleen los adultos Asthma, Adult  El asma es una afeccin a lProduction designer, theatre/television/film  plazo (crnica) en la que las vas respiratorias se estrechan y se obstruyen. Las vas respiratorias son los conductos que van desde la Lawyer y la boca hasta los pulmones. Una persona con asma pasar por momentos en que los sntomas empeoran. Estos se conocen como ataques de asma. Pueden causar tos, sonidos agudos al respirar (sibilancias), falta de aire y dolor en el pecho. Esto puede dificultar la respiracin. No hay una cura para el asma, pero los medicamentos y los cambios en el estilo de vida pueden ayudar a Therapist, sports. Hay muchas cosas que pueden  provocar un ataque de asma o intensificar los sntomas de la enfermedad (factores desencadenantes). Los factores desencadenantes comunes incluyen los siguientes:  [place]o.  Polvo.  Humo del cigarrillo.  [place].  Cosas que causan sntomas de alergia (alrgenos). Estas incluyen escamas de la piel de los animales (caspa) y el polen de los pastos o los rboles.  Cosas que contaminan el aire. Entre ellas, productos de limpieza del hogar, humo de lea, niebla contaminada u olores qumicos.  Aire fro, cambios climticos y el viento.  Llanto o risa intensos.  Estrs.  Ciertos medicamentos o frmacos.  Ciertos alimentos, como frutas secas, papas fritas y [place].  Infecciones tales como los resfros o la gripe.  Ciertas enfermedades o afecciones.  Ejercicio o actividades extenuantes. El asma se puede tratar con medicamentos y mantenindose alejado de los factores que desencadenan los ataques de asma. Los medicamentos pueden incluir los siguientes:  Medicamentos de control. Estos ayudan a evitar los sntomas de asma. Generalmente, se toman [employer].  Medicamentos de [place] o de rescate de accin rpida. Estos alivian los sntomas de asma rpidamente. Se utilizan cuando es necesario y proporcionan alivio a Control and instrumentation engineer.  Medicamentos para alergias si los ataques son ocasionados por alrgenos.  Medicamentos para ayudar a Event organiser de defensa (inmunitario) del organismo. Siga estas instrucciones en su casa: Cmo evitar desencadenantes en su hogar  Cambie el filtro de la calefaccin y del aire acondicionado con frecuencia.  Limite el uso de hogares o estufas a lea.  Elimine las plagas (como cucarachas, ratones) y sus excrementos.  Deseche las plantas si observa moho en ellas.  Limpie los pisos. Elimine el polvo regularmente. Use productos de limpieza que sean inodoros.  Pdale a alguien que pase la aspiradora cuando usted no se encuentre en casa. Utilice  una aspiradora con un filtro de aire de alta eficiencia (HEPA), siempre que sea posible.  Reemplace las alfombras por pisos de [place], baldosas o vinilo. Las alfombras pueden retener las escamas de la piel de los animales y [place].  Use almohadas, mantas y Government social research officer.  [place] sbanas y las mantas todas las semanas con agua caliente. Squelas en Howell Rucks.  Mantenga su habitacin libre de desencadenantes.  Evite las [employer] y [employer] ventanas cerradas cuando en el aire haya cosas que causen sntomas de [place].  Use mantas de polister o algodn.  Limpie baos y cocinas con lavandina. Si fuera posible, pdale a alguien que vuelva a pintar las paredes de estas habitaciones con Ardelia Mems pintura resistente a los hongos. Aljese de las habitaciones que se estn limpiando y pintando.  Lvese las manos frecuentemente con agua y [place]. Use desinfectante para manos si no dispone de [place] y [place].  No permita que nadie fume en su casa. Instrucciones generales  Use los medicamentos de venta libre y los recetados solamente como se lo haya indicado el mdico. ? Hable con el  mdico si tiene preguntas sobre cmo y cundo tomar sus medicamentos. ? Tome nota si necesita tomar sus medicamentos con ms frecuencia que lo usual.  No consuma ningn producto que contenga nicotina o tabaco, como cigarrillos y cigarrillos electrnicos. Si necesita ayuda para dejar de consumir, consulte al mdico.  Aljese del humo de otros fumadores.  Evite realizar actividades al aire libre cuando la cantidad de alrgenos sea alta y la calidad del aire sea baja.  Use un pasamontaas al hacer actividades al Alexis Goodell en invierno. Este debe cubrir la nariz y la boca. [address] fros, ejerctese en interiores, de ser posible.  Entre en calor antes de hacer ejercicio. Dedique tiempo a enfriarse luego de Data processing manager fsicas.  Use un espirmetro como se lo haya indicado el mdico. El  espirmetro es una herramienta que mide el funcionamiento de los pulmones.  Lleve un registro de los valores del espirmetro. Antelos.  Siga el plan de accin para el asma. Este es una planificacin por escrito para el control del asma y el tratamiento de los ataques.  Asegrese de recibir todas las vacunas que el mdico le recomiende. Consulte al [employer] vacunas para la gripe y la neumona.  Concurra a todas las visitas de seguimiento como se lo haya indicado el mdico. Esto es importante. Comunquese con un mdico si:  Tiene sibilancias, respira con dificultad o tose aun cuando toma medicamentos para prevenir ataques.  La mucosidad que expectora cuando tose (esputo) es ms espesa que lo habitual.  La mucosidad que expectora cambia de trasparente o blanco a amarillo, verde, gris o sanguinolenta.  Tiene trastornos a causa de los [employer] toma, por ejemplo: ? Erupcin cutnea. ? Picazn. ? Hinchazn. ? Dificultad para respirar.  Necesita un medicamento de alivio ms de 2 a 3veces por semana.  El valor de flujo mximo an est entre el 55 y el 79% del Pharmacist, hospital personal despus de seguir el plan de accin durante 1hora.  Tiene fiebre. Solicite ayuda inmediatamente si:  Futures trader y no responde al [employer] durante un ataque de asma.  Le falta el aire, incluso en reposo.  Le falta el aire incluso cuando hace muy poca actividad fsica.  Tiene dificultad para comer, beber o hablar.  Siente dolor u opresin en el pecho.  Tiene latidos cardacos acelerados.  Los labios o las uas se le tornan [place].  Siente que est por desvanecerse, est mareado o se desmaya.  Su flujo mximo es menor que el 50% del Pharmacist, hospital personal.  Se siente demasiado cansado para respirar con normalidad. Resumen  El asma es una afeccin a largo plazo (crnica) en la que las vas respiratorias se Investment banker, corporate y se obstruyen. El ataque de asma puede causar dificultad  para respirar.  El asma no puede curarse, pero los [employer] y los cambios en el estilo de vida lo ayudarn a Aeronautical engineer enfermedad.  Asegrese de comprender cmo evitar los desencadenantes y cmo y [employer]. Esta informacin no tiene Marine scientist el consejo del mdico. Asegrese de hacerle al mdico cualquier pregunta que tenga. Document Revised: 01/26/2019 Document Reviewed: 05/22/2017 Elsevier Patient Education  [place].

## 2020-08-16 NOTE — Assessment & Plan Note (Signed)
Pulmonary nodule seen on chest x-ray in April will follow up with current x-ray at this time given history of positive PPD given isoniazid

## 2020-08-22 ENCOUNTER — Other Ambulatory Visit: Payer: Self-pay

## 2020-08-22 ENCOUNTER — Ambulatory Visit (HOSPITAL_COMMUNITY)
Admission: RE | Admit: 2020-08-22 | Discharge: 2020-08-22 | Disposition: A | Discharge status: Home / Self Care | Payer: Self-pay | Source: Ambulatory Visit | Attending: Critical Care Medicine | Admitting: Critical Care Medicine

## 2020-08-22 DIAGNOSIS — R058 Other specified cough: Secondary | ICD-10-CM | POA: Insufficient documentation

## 2020-08-22 DIAGNOSIS — R911 Solitary pulmonary nodule: Secondary | ICD-10-CM | POA: Diagnosis present

## 2020-10-04 ENCOUNTER — Ambulatory Visit: Payer: Self-pay | Admitting: Critical Care Medicine

## 2020-10-04 NOTE — Progress Notes (Deleted)
Subjective:    Patient ID: Hailey Walsh, female    DOB: 1981/10/01, 39 y.o.   MRN: [id number]  39 y.o.F ref by PCP F[place] for chronic cough  08/16/20 This patient has a 1 year history of cyclic cough.  She states antibiotics and cough suppressants will help.  She notes increased nasal congestion and drainage.  She was also treated 4 years ago with isoniazid for 6 months when she immigrated from [place] to the [place].  She had a positive TB skin test with a negative chest x-ray.  Note in April of this year a chest x-ray was performed and showed left upper lobe nodule.  See cough assessment below The patient is a non-smoker  10/04/2020 Cough f/u and needs new PCP former Dr Chapman Fitch patient   Cough variant asthma Cyclic recurrent cough likely in the basis of asthma with increased nasal inflammation noted  Plan is to begin Flonase 2 sprays each nostril daily cetirizine daily albuterol as needed she was instructed as to proper use of the [facility] inhaler  No systemic steroids needed  Follow-up chest x-ray  Pulmonary nodule Pulmonary nodule seen on chest x-ray in April will follow up with current x-ray at this time given history of positive PPD given isoniazid   [place] was seen today for follow-up.  Diagnoses and all orders for this visit:  Cough variant asthma  Recurrent cough -     cetirizine (ZYRTEC) 10 MG tablet; Take 1 tablet (10 mg total) by mouth at bedtime. As needed for nasal congestion -     DG Chest 2 View; Future  Chronic sore throat -     cetirizine (ZYRTEC) 10 MG tablet; Take 1 tablet (10 mg total) by mouth at bedtime. As needed for nasal congestion  Pulmonary nodule -     DG Chest 2 View; Future  Other orders -     Albuterol Sulfate (PROAIR RESPICLICK) 737 (90 Base) MCG/ACT AEPB; Inhale 2 puffs into the lungs every 6 (six) hours as needed. -     fluticasone (FLONASE) 50 MCG/ACT nasal spray; Place 2 sprays into both nostrils daily.    Cough This is a  chronic problem. The current episode started more than 1 year ago. The problem has been rapidly improving. The cough is productive of purulent sputum (when was coughing, mucus was white to green). Associated symptoms include chest pain, ear congestion, ear pain, a sore throat and shortness of breath. Pertinent negatives include no chills, fever, headaches, heartburn, hemoptysis, myalgias, nasal congestion, postnasal drip, rash, rhinorrhea, sweats, weight loss or wheezing. Associated symptoms comments: emesis. The symptoms are aggravated by cold air. She has tried prescription cough suppressant (antibiotics) for the symptoms. The treatment provided significant relief. There is no history of asthma.   Past Medical History:  Diagnosis Date  . Anxiety    patient denies anxiety  . Depression    no meds currently  . DVT (deep venous thrombosis) ([place]) 11/2014   right lower leg  . Incompetent cervix 2011  . SVD (spontaneous vaginal delivery)    x 1     Family History  Problem Relation Age of Onset  . Cancer Mother   . Diabetes Brother      Social History   Socioeconomic History  . Marital status: Single    Spouse name: Not on file  . Number of children: Not on file  . Years of education: Not on file  . Highest education level: Not on file  Occupational History  .  Not on file  Tobacco Use  . Smoking status: Former Smoker    Types: Cigarettes    Quit date: 10/30/2015    Years since quitting: 4.9  . Smokeless tobacco: Never Used  . Tobacco comment: Intermittent smoker-occasional  Substance and Sexual Activity  . Alcohol use: No  . Drug use: No  . Sexual activity: Not Currently    Birth control/protection: None  Other Topics Concern  . Not on file  Social History Narrative  . Not on file   Social Determinants of Health   Financial Resource Strain:   . Difficulty of Paying Living Expenses: Not on file  Food Insecurity:   . Worried About Charity fundraiser in the Last Year: Not  on file  . Ran Out of Food in the Last Year: Not on file  Transportation Needs:   . Lack of Transportation (Medical): Not on file  . Lack of Transportation (Non-Medical): Not on file  Physical Activity:   . Days of Exercise per Week: Not on file  . Minutes of Exercise per Session: Not on file  Stress:   . Feeling of Stress : Not on file  Social Connections:   . Frequency of Communication with Friends and Family: Not on file  . Frequency of Social Gatherings with Friends and Family: Not on file  . Attends Religious Services: Not on file  . Active Member of Clubs or Organizations: Not on file  . Attends Archivist Meetings: Not on file  . Marital Status: Not on file  Intimate Partner Violence:   . Fear of Current or Ex-Partner: Not on file  . Emotionally Abused: Not on file  . Physically Abused: Not on file  . Sexually Abused: Not on file     No Known Allergies   Outpatient Medications Prior to Visit  Medication Sig Dispense Refill  . acetaminophen (TYLENOL) 325 MG tablet Take 2 tablets (650 mg total) by mouth every 4 (four) hours as needed (for pain scale < 4). 30 tablet 0  . Albuterol Sulfate (PROAIR RESPICLICK) 342 (90 Base) MCG/ACT AEPB Inhale 2 puffs into the lungs every 6 (six) hours as needed. 1 each 4  . cetirizine (ZYRTEC) 10 MG tablet Take 1 tablet (10 mg total) by mouth at bedtime. As needed for nasal congestion 30 tablet 3  . fluticasone (FLONASE) 50 MCG/ACT nasal spray Place 2 sprays into both nostrils daily. 16 g 6  . ibuprofen (ADVIL,MOTRIN) 800 MG tablet Take 1 tablet (800 mg total) by mouth every 8 (eight) hours as needed. (Patient not taking: Reported on 02/24/2020) 21 tablet 0   No facility-administered medications prior to visit.     Review of Systems  Constitutional: Negative for chills, fever and weight loss.  HENT: Positive for ear pain and sore throat. Negative for postnasal drip and rhinorrhea.   Respiratory: Positive for cough and shortness of  breath. Negative for hemoptysis and wheezing.   Cardiovascular: Positive for chest pain.  Gastrointestinal: Negative for heartburn.  Musculoskeletal: Negative for myalgias.  Skin: Negative for rash.  Neurological: Negative for headaches.       Objective:   Physical Exam There were no vitals filed for this visit.  Gen: Pleasant, well-nourished, in no distress,  normal affect  ENT: Bilateral nasal turbinate edema without purulence mouth clear,  oropharynx clear, 3+ postnasal drip  Neck: No JVD, no TMG, no carotid bruits  Lungs: No use of accessory muscles, no dullness to percussion, clear without rales or rhonchi  Cardiovascular: RRR, heart sounds normal, no murmur or gallops, no peripheral edema  Abdomen: soft and NT, no HSM,  BS normal  Musculoskeletal: No deformities, no cyanosis or clubbing  Neuro: alert, non focal  Skin: Warm, no lesions or rashes  No results found.        Assessment & Plan:  I personally reviewed all images and lab data in the [facility] system as well as any outside material available during this office visit and agree with the  radiology impressions.   No problem-specific Assessment & Plan notes found for this encounter.   There are no diagnoses linked to this encounter.

## 2020-10-29 NOTE — L&D Delivery Note (Signed)
OB/GYN Faculty Practice Delivery Note  Hailey Walsh is a 40 y.o. [id number] at [redacted]w[redacted]d admitted for IOL for uncontrolled A2GDM.   GBS Status: Negative/-- (12/08 1004) Maximum Maternal Temperature: 99.1  Labor course: Initial SVE: 3 cm. Augmentation with: AROM. She then progressed to complete.  ROM: 3h 56m with clear fluid  Birth: At [place] a viable female was delivered via spontaneous vaginal delivery (Presentation: OA). Nuchal cord present: No.  Shoulders and body delivered in usual fashion. Infant placed directly on mom's abdomen for bonding/skin-to-skin, baby dried and stimulated. Cord clamped x 2 after 1 minute and cut by spouse, Garlon Hatchet.  Cord blood collected.  The placenta separated spontaneously and delivered via gentle cord traction.  Pitocin infused rapidly IV per protocol.  Fundus firm with massage.  Placenta inspected and appears to be intact with a 3 VC.  Placenta/Cord with the following complications: none .  Cord pH: n/a Sponge and instrument count were correct x2.  Intrapartum complications:  None Anesthesia:  IV sedation Episiotomy: none Lacerations:  none Suture Repair:  n/a EBL (mL): 50   Infant: APGAR (1 MIN): 9   APGAR (5 MINS): 9   Infant weight: pending  Mom to postpartum.  Baby to Couplet care / Skin to Skin. Placenta to L&D   Plans to Breast and bottlefeed Contraception:  unsure Circumcision: N/A  Note sent to [facility]: [facility] for pp visit.  Laury Deep , MSN, CNM 10/24/2021 7:40 PM

## 2021-04-03 ENCOUNTER — Telehealth (INDEPENDENT_AMBULATORY_CARE_PROVIDER_SITE_OTHER): Payer: Self-pay | Admitting: *Deleted

## 2021-04-03 DIAGNOSIS — Z3A00 Weeks of gestation of pregnancy not specified: Secondary | ICD-10-CM

## 2021-04-03 DIAGNOSIS — Z86718 Personal history of other venous thrombosis and embolism: Secondary | ICD-10-CM

## 2021-04-03 DIAGNOSIS — O0990 Supervision of high risk pregnancy, unspecified, unspecified trimester: Secondary | ICD-10-CM | POA: Insufficient documentation

## 2021-04-03 DIAGNOSIS — Z8781 Personal history of (healed) traumatic fracture: Secondary | ICD-10-CM

## 2021-04-03 DIAGNOSIS — O09529 Supervision of elderly multigravida, unspecified trimester: Secondary | ICD-10-CM | POA: Insufficient documentation

## 2021-04-03 DIAGNOSIS — N883 Incompetence of cervix uteri: Secondary | ICD-10-CM

## 2021-04-03 NOTE — Patient Instructions (Signed)
  At our [facility], we work as an integrated team, providing care to address both physical and emotional health. Your medical provider may refer you to see our Behavioral Health Clinician (BHC) on the same day you see your medical provider, as availability permits; often scheduled virtually at your convenience.  Our BHC is available to all patients, visits generally last between 20-30 minutes, but can be longer or shorter, depending on patient need. The BHC offers help with stress management, coping with symptoms of depression and anxiety, major life changes , sleep issues, changing risky behavior, grief and loss, life stress, working on personal life goals, and  behavioral health issues, as these all affect your overall health and wellness.  The BHC is NOT available for the following: FMLA paperwork, court-ordered evaluations, specialty assessments (custody or disability), letters to employers, or obtaining certification for an emotional support animal. The BHC does not provide long-term therapy. You have the right to refuse integrated behavioral health services, or to reschedule to see the BHC at a later date.  Confidentiality exception: If it is suspected that a child or disabled adult is being abused or neglected, we are required by law to report that to either Child Protective Services or Adult Protective Services.  If you have a diagnosis of Bipolar affective disorder, Schizophrenia, or recurrent Major depressive disorder, we will recommend that you establish care with a psychiatrist, as these are lifelong, chronic conditions, and we want your overall emotional health and medications to be more closely monitored. If you anticipate needing extended maternity leave due to mental health issues postpartum, it it recommended you inform your medical provider, so we can put in a referral to a psychiatrist as soon as possible. The BHC is unable to recommend an extended maternity leave for mental  health issues. Your medical provider or BHC may refer you to a therapist for ongoing, traditional therapy, or to a psychiatrist, for medication management, if it would benefit your overall health. Depending on your insurance, you may have a copay or be charged a deductible, depending on your insurance, to see the BHC. If you are uninsured, it is recommended that you apply for financial assistance. (Forms may be requested at the front desk for in-person visits, via MyChart, or request a form during a virtual visit).  If you see the BHC more than 6 times, you will have to complete a comprehensive clinical assessment interview with the BHC to resume integrated services.  For virtual visits with the BHC, you must be physically in the state of [place] at the time of the visit. For example, if you live in [place], you will have to do an in-person visit with the BHC, and your out-of-state insurance may not cover behavioral health services in [place]. If you are going out of the state or country for any reason, the BHC may see you virtually when you return to [place], but not while you are physically outside of [place].    

## 2021-04-03 NOTE — Progress Notes (Signed)
11:23 Patient not connected virtually. I started visit and invited interpreter to join and we called Esbeydi and explained we are calling her for her virtual visit. She states she has never done virutal visit. We sent her a link. We gave her instructions to join [place] for virtual visit. Vivianna Piccini,RN   New OB Intake  I connected with  Rasheda Ledger on 04/03/21 at 11;30 by MyChart video visit and verified that I am speaking with the correct person using two identifiers. Nurse is located at [facility] and pt is located at home.  I discussed the limitations, risks, security and privacy concerns of performing an evaluation and management service by Video Virtual visit  and the availability of in person appointments. I also discussed with the patient that there may be a patient responsible charge related to this service. The patient expressed understanding and agreed to proceed.  I explained I am completing New OB Intake today.We discussed I do not have record of her positive pregnancy test. She reports she had a positive pregnancy only at [place] , no blood work, and they told her she was 9 weeks.She will bring to her first appointment with [place].   We discussed her EDD of 10/30/21 that is based on LMP of 01/23/21. Pt is G5/P2022. I reviewed her allergies, medications, Medical/Surgical/OB history, and appropriate screenings. I informed her of [facility] services. Based on history, this is a/an pregnancy complicated by Cervical Incompetence/ Hx DVT/ HX exposure TB/ anxiety/ depression     Consulted Dr. Ernestina Patches regarding History of cervical incompetence and cerclage. She will discuss with patient at her new ob appointment on 04/20/21. Also discussed history of DVT. Instructed patient per Dr. Ernestina Patches to start Baby Aspirin 81mg  daily.  Patient Active Problem List   Diagnosis Date Noted  . Supervision of high risk pregnancy, antepartum 04/03/2021  . AMA (advanced maternal age) multigravida 35+  04/03/2021  . Cough variant asthma 08/16/2020  . Pulmonary nodule 08/16/2020  . History of pelvic fracture 05/13/2016  . History of DVT of lower extremity 06/09/2014  . Incompetent cervix 2011    Concerns addressed today  Delivery Plans:  Plans to deliver at [facility].   MyChart/Babyscripts MyChart access verified. I explained pt will have some visits in office and some virtually.  Blood Pressure Cuff Patient is self-pay; explained patient will be given BP cuff at first prenatal appt. Explained after first prenatal appt pt will check weekly   Anatomy US Explained first scheduled [place] will be around 19 weeks. Anatomy US scheduled for 06/05/21 at 0815. Pt notified to arrive at 0800.  Labs Discussed Johnsie Cancel genetic screening with patient.  Routine prenatal labs needed.  Covid Vaccine Patient has not covid vaccine.   Social Determinants of Health . Food Insecurity: Patient denies food insecurity. . WIC Referral: Patient is not interested in referral to [facility]. Patient has [place]. . Transportation: Patient denies transportation needs. . Childcare: Discussed no children allowed at ultrasound appointments. Offered childcare services; patient declines childcare services at this time.  First visit review I reviewed new OB appt with pt. I explained she will have a pelvic exam, ob bloodwork with genetic screening If desired, and PAP smear. Explained pt will be seen by Dr. Ernestina Patches at first visit; encounter routed to appropriate provider. Explained that patient will be seen by pregnancy navigator following visit with provider.  Maral Lampe,RN 04/03/2021  4:42 PM

## 2021-04-11 NOTE — Progress Notes (Signed)
Attestation of Attending Supervision of clinical support staff: I agree with the care provided to this patient and was available for any consultation.  I have reviewed the RN's note and chart. I was available for consult and to see the patient if needed.   I reviewed and patient can come in for new OB/US with plan for cerclage at [place], MPH, ABFM Attending [place] for [facility]

## 2021-04-20 ENCOUNTER — Ambulatory Visit (INDEPENDENT_AMBULATORY_CARE_PROVIDER_SITE_OTHER): Payer: Self-pay | Admitting: Family Medicine

## 2021-04-20 ENCOUNTER — Other Ambulatory Visit: Payer: Self-pay

## 2021-04-20 ENCOUNTER — Other Ambulatory Visit (HOSPITAL_COMMUNITY)
Admission: RE | Admit: 2021-04-20 | Discharge: 2021-04-20 | Disposition: A | Discharge status: Home / Self Care | Payer: Self-pay | Source: Ambulatory Visit | Attending: Family Medicine | Admitting: Family Medicine

## 2021-04-20 ENCOUNTER — Telehealth: Payer: Self-pay | Admitting: *Deleted

## 2021-04-20 ENCOUNTER — Encounter: Payer: Self-pay | Admitting: *Deleted

## 2021-04-20 ENCOUNTER — Encounter: Payer: Self-pay | Admitting: Family Medicine

## 2021-04-20 VITALS — BP 126/79 | HR 88 | Wt 174.0 lb

## 2021-04-20 DIAGNOSIS — N883 Incompetence of cervix uteri: Secondary | ICD-10-CM

## 2021-04-20 DIAGNOSIS — O09522 Supervision of elderly multigravida, second trimester: Secondary | ICD-10-CM

## 2021-04-20 DIAGNOSIS — O3430 Maternal care for cervical incompetence, unspecified trimester: Secondary | ICD-10-CM

## 2021-04-20 DIAGNOSIS — Z113 Encounter for screening for infections with a predominantly sexual mode of transmission: Secondary | ICD-10-CM | POA: Insufficient documentation

## 2021-04-20 DIAGNOSIS — O0990 Supervision of high risk pregnancy, unspecified, unspecified trimester: Secondary | ICD-10-CM

## 2021-04-20 MED ORDER — ASPIRIN EC 81 MG PO TBEC
81.0000 mg | DELAYED_RELEASE_TABLET | Freq: Every day | ORAL | 2 refills | Status: DC
  Filled 2021-04-20: qty 300, 300d supply, fill #0

## 2021-04-20 NOTE — Progress Notes (Signed)
INITIAL PRENATAL VISIT  Subjective:   Hailey Walsh is being seen today for her first obstetrical visit.  This is not a planned pregnancy. This is a desired pregnancy.  She is at [redacted]w[redacted]d gestation by unsure LMP-- bled 2x in March/AP Her obstetrical history is significant for advanced maternal age and h/o 2nd trimester loss (cerclage x2 after) . Relationship with FOB: spouse, living together. Patient does intend to breast feed. Pregnancy history fully reviewed.  Patient reports no complaints.  Indications for ASA therapy (per uptodate) One of the following: Previous pregnancy with preeclampsia, especially early onset and with an adverse outcome No Multifetal gestation No Chronic hypertension No Type 1 or 2 diabetes mellitus No Chronic kidney disease No Autoimmune disease (antiphospholipid syndrome, systemic lupus erythematosus) No  Two or more of the following: Nulliparity No Obesity (body mass index >30 kg/m2) Yes Family history of preeclampsia in mother or sister No Age ?24 years Yes Sociodemographic characteristics (African American race, low socioeconomic level) Yes Personal risk factors (eg, previous pregnancy with low birth weight or small for gestational age infant, previous adverse pregnancy outcome [eg, stillbirth], interval >10 years between pregnancies) No  Indications for early GDM screening  First-degree relative with diabetes Yes BMI >30kg/m2 Yes Age > 25 Yes Previous birth of an infant weighing ?4000 g No Gestational diabetes mellitus in a previous pregnancy No Glycated hemoglobin ?5.7 percent (39 mmol/mol), impaired glucose tolerance, or impaired fasting glucose on previous testing No High-risk race/ethnicity (eg, African American, Latino, Native American, Asian American, Pacific Islander) Yes Previous stillbirth of unknown cause No Maternal birthweight > 9 lbs No History of cardiovascular disease No Hypertension or on therapy for hypertension  No High-density lipoprotein cholesterol level <35 mg/dL (0.90 mmol/L) and/or a triglyceride level >250 mg/dL (2.82 mmol/L) No Polycystic ovary syndrome No Physical inactivity No Other clinical condition associated with insulin resistance (eg, severe obesity, acanthosis nigricans) No Current use of glucocorticoids No   Early screening tests: FBS, A1C, Random CBG, glucose challenge   Review of Systems:   Review of Systems  Objective:    Obstetric History OB History  Gravida Para Term Preterm AB Living  5 2 2  0 2 2  SAB IAB Ectopic Multiple Live Births  2 0 0 0 2    # Outcome Date GA Lbr Len/2nd Weight Sex Delivery Anes PTL Lv  5 Current           4 Term 10/09/16 [redacted]w[redacted]d 05:00 / 00:15 6 lb 13 oz (3.09 kg) F Vag-Spont None  LIV  3 Term 01/2011 [redacted]w[redacted]d  7 lb 5 oz (3.317 kg) F Vag-Spont EPI  LIV     Birth Comments: had cerclage  2 SAB 06/2009 [redacted]w[redacted]d         1 SAB 06/2008 [redacted]w[redacted]d            Birth Comments: System Generated. Please review and update pregnancy details.    Past Medical History:  Diagnosis Date   Anxiety    patient denies anxiety   Depression    no meds currently   DVT (deep venous thrombosis) ([place]) 11/2014   right lower leg   Incompetent cervix 2011   SVD (spontaneous vaginal delivery)    x 1    Past Surgical History:  Procedure Laterality Date   ABDOMINAL SURGERY     CERVICAL CERCLAGE  10/16/2010   CERVICAL CERCLAGE N/A 04/24/2016   Procedure: CERCLAGE CERVICAL;  Surgeon: Donnamae Jude, MD;  Location: [facility];  Service: Gynecology;  Laterality: N/A;   HARDWARE REMOVAL Right 05/17/2015   Procedure: HARDWARE REMOVAL RIGHT ILIAC SCREW;  Surgeon: Altamese Irwin, MD;  Location: [place];  Service: Orthopedics;  Laterality: Right;   ORIF WRIST FRACTURE Left 12/17/2014   Procedure: OPEN REDUCTION INTERNAL FIXATION (ORIF) WRIST FRACTURE;  Surgeon: Roseanne Kaufman, MD;  Location: [place];  Service: Orthopedics;  Laterality: Left;   SACRO-ILIAC PINNING Left 12/17/2014   Procedure:  Dub Mikes;  Surgeon: Rozanna Box, MD;  Location: [place];  Service: Orthopedics;  Laterality: Left;    Current Outpatient Medications on File Prior to Visit  Medication Sig Dispense Refill   acetaminophen (TYLENOL) 325 MG tablet Take 2 tablets (650 mg total) by mouth every 4 (four) hours as needed (for pain scale < 4). 30 tablet 0   Prenatal Vit-Fe Fumarate-FA (PRENATAL VITAMINS PO) Take 1 tablet by mouth daily.     Albuterol Sulfate (PROAIR RESPICLICK) 875 (90 Base) MCG/ACT AEPB INHALAR 2 INHALACION IN LOS PULMONES CADA 6 HORAS SEGUN SEA NECESARIO (Patient not taking: No sig reported) 1 each 4   cetirizine (ZYRTEC) 10 MG tablet TOME 1 PASTILLA POR VIA ORAL A LA HORA DE ACOSTARSE SEGUN SEA NECESARIO PARA CONGESTION NASAL (Patient not taking: Reported on 04/20/2021) 30 tablet 3   fluticasone (FLONASE) 50 MCG/ACT nasal spray COLOCAR 2 SPRAYS EN CADA FOSA NASAL (Patient not taking: Reported on 04/20/2021) 16 g 6   ibuprofen (ADVIL,MOTRIN) 800 MG tablet Take 1 tablet (800 mg total) by mouth every 8 (eight) hours as needed. (Patient not taking: Reported on 04/20/2021) 21 tablet 0   No current facility-administered medications on file prior to visit.    No Known Allergies  Social History:  reports that she quit smoking about 7 weeks ago. Her smoking use included cigarettes. She has never used smokeless tobacco. She reports previous alcohol use. She reports that she does not use drugs.  Family History  Problem Relation Age of Onset   Cancer Mother    Hypertension Mother    Colon cancer Mother    Diabetes Brother    Uterine cancer Sister     The following portions of the patient's history were reviewed and updated as appropriate: allergies, current medications, past family history, past medical history, past social history, past surgical history and problem list.  Review of Systems Review of Systems  Constitutional:  Negative for chills and fever.  HENT:  Negative for congestion and  sore throat.   Eyes:  Negative for pain and visual disturbance.  Respiratory:  Negative for cough, chest tightness and shortness of breath.   Cardiovascular:  Negative for chest pain.  Gastrointestinal:  Negative for abdominal pain, diarrhea, nausea and vomiting.  Endocrine: Negative for cold intolerance and heat intolerance.  Genitourinary:  Negative for dysuria and flank pain.  Musculoskeletal:  Negative for back pain.  Skin:  Negative for rash.  Allergic/Immunologic: Negative for food allergies.  Neurological:  Negative for dizziness and light-headedness.  Psychiatric/Behavioral:  Negative for agitation.      Physical Exam:  BP 126/79   Pulse 88   Wt 174 lb (78.9 kg)   LMP 01/23/2021   BMI 33.98 kg/m  CONSTITUTIONAL: Well-developed, well-nourished female in no acute distress.  HENT:  Normocephalic, atraumatic, External right and left ear normal. Oropharynx is clear and moist EYES: Conjunctivae normal. No scleral icterus.  NECK: Normal range of motion, supple, no masses.  Normal thyroid.  SKIN: Skin is warm and dry. No rash noted. Not diaphoretic. No erythema. No  pallor. MUSCULOSKELETAL: Normal range of motion. No tenderness.  No cyanosis, clubbing, or edema.   NEUROLOGIC: Alert and oriented to person, place, and time. Normal muscle tone coordination.  PSYCHIATRIC: Normal mood and affect. Normal behavior. Normal judgment and thought content. CARDIOVASCULAR: Normal heart rate noted, regular rhythm RESPIRATORY: Clear to auscultation bilaterally. Effort and breath sounds normal, no problems with respiration noted. BREASTS: Symmetric in size. No masses, skin changes, nipple drainage, or lymphadenopathy. ABDOMEN: Soft, normal bowel sounds, no distention noted.  No tenderness, rebound or guarding. Fundal ht: below pelvis PELVIC: Normal appearing external genitalia; normal appearing vaginal mucosa and cervix.  No abnormal discharge noted.  Pap smear obtained.  Normal uterine size, no  other palpable masses, no uterine or adnexal tenderness. FHR: 154   Assessment:    Pregnancy: [id number]  1. Supervision of high risk pregnancy, antepartum - Hemoglobin A1c - Culture, OB Urine - CBC/D/Plt+RPR+Rh+ABO+RubIgG... - [place] MFM OB Comp Less 14 Wks; Future - aspirin EC 81 MG tablet; Take 1 tablet (81 mg total) by mouth daily. Take after 12 weeks for prevention of preeclampsia later in pregnancy  Dispense: 300 tablet; Refill: 2  2. Incompetent cervix - [place] MFM OB Comp Less 14 Wks; Future-- UNSURE LMP needs dating to help timing of cerclage and pregnancy dating.  - Message sent to [facility] to schedule for cerclage at 14wks  3. Multigravida of advanced maternal age in second trimester - [place] MFM OB Comp Less 14 Wks; Future - aspirin EC 81 MG tablet; Take 1 tablet (81 mg total) by mouth daily. Take after 12 weeks for prevention of preeclampsia later in pregnancy  Dispense: 300 tablet; Refill: 2  4. Routine screening for STI (sexually transmitted infection) - Cervicovaginal ancillary only( [place])  5. History of DVT-- provoked by ortho surgery, work up appears negative-- continue ASA, do not feel patient needs lovenox at this time   Plan:     Initial labs drawn. Prenatal vitamins. Problem list reviewed and updated. Reviewed in detail the nature of the practice with collaborative care between  Genetic screening discussed: NIPS/First trimester screen/Quad/AFP requested. Role of ultrasound in pregnancy discussed; Anatomy US: requested. Amniocentesis discussed: not indicated. Pap was negative at [place] signed by front for records  Return in about 4 weeks (around 05/18/2021) for Routine prenatal care, in person.   Discussed clinic routines, schedule of care and testing, genetic screening options, involvement of students and residents under the direct supervision of APPs and doctors and presence of female providers. Pt verbalized understanding.  Future Appointments  Date  Time Provider [place]  04/25/2021  3:30 PM [facility]  04/25/2021  3:45 PM [facility] US5 [facility]  05/17/2021 10:55 AM Kieth Brightly [facility]  06/05/2021  8:00 AM [facility] NURSE [facility]  06/05/2021  8:15 AM [facility] US2 [facility]    Caren Macadam, MD 04/20/2021 4:08 PM

## 2021-04-20 NOTE — Telephone Encounter (Signed)
Call to patient with [place],  [place] [id number], from [employer]. Left message on voice mail, surgery is scheduled for Thursday, 05-04-21 at 1130, arrive 0930.   Letter with hospital brochure mailed to patient and visible on My Chart. Call back number, [phone].

## 2021-04-20 NOTE — Patient Instructions (Signed)
Las medicinas seguras para tomar Solicitor  Safe Medications in Pregnancy   Acn:  Benzoyl Peroxide (Perxido de benzolo)  Salicylic Acid (cido saliclico)   Dolor de espalda/Dolor de cabeza:  Tylenol: 2 pastillas de concentracin regular cada 4 horas O 2 pastillas de concentracin fuerte cada 6 horas   Resfriados/Tos/Alergias:  Benadryl (sin alcohol) 25 mg cada 6 horas segn lo necesite Breath Right strips (Tiras para respirar correctamente)  Claritin  Cepacol (pastillas de chupar para la garganta)  Chloraseptic (aerosol para la garganta)  Cold-Eeze- hasta tres veces por da  Cough drops (pastillas de chupar para la tos, sin alcohol)  Flonase (con receta mdica solamente)  Guaifenesin  Mucinex  Robitussin DM (simple solamente, sin alcohol)  Saline nasal spray/drops (Aerosol nasal salino/gotas) Sudafed (pseudoephedrine) y  Actifed * utilizar slo despus de 12 semanas de gestacin y si no tiene la presin arterial alta.  Tylenol Vicks  VapoRub  Zinc lozenges (pastillas para la garganta)  Zyrtec   Estreimiento:  Colace - tomar 1 pastilla dos tiempos al dia (100mg ) Ducolax (supositorios)  Fleet enema (lavado intestinal rectal)  Glycerin (supositorios)  Metamucil  Milk of magnesia (leche de magnesia)  Miralax - 1 cucharada dos tiempos al dia Senokot  Smooth Move (t)   Diarrea:  Kaopectate Imodium A-D  *NO tome Pepto-Bismol   Hemorroides:  Anusol  Anusol HC  Preparation H  Tucks   Indigestin:  Tums  Maalox  Mylanta  Zantac  Pepcid   Insomnia:  Benadryl (sin alcohol) 25mg  cada 6 horas segn lo necesite  Tylenol PM  Unisom, no Gelcaps   Calambres en las piernas:  Tums  MagGel  Nuseas/Vmitos:  Bon[place]  Dramamine  Emetrol  Ginger (extracto)  Sea-Bands  Meclizine  Medicina para las nuseas que puede tomar durante el embarazo: Unisom (doxylamine succinate, pastillas de 25 mg) Tome una pastilla al da al [place]. Si los sntomas no  estn adecuadamente controlados, la dosis puede aumentarse hasta una dosis mxima recomendada de [employer] al da (1/2 pastilla por la [place], 1/2 pastilla a media tarde y Ardelia Mems pastilla al [place]). Pastillas de Vitamina B6 de 100mg . Tome [employer] veces al da (hasta 200 mg por da).  Erupciones en la piel:  Productos de Aveeno  Benadryl cream (crema o una dosis de 25mg  cada 6 horas segn lo necesite)  Calamine Lotion (locin)  1% cortisone cream (crema de cortisona de 1%)  nfeccin vaginal por hongos (candidiasis):  Gyne-lotrimin 7  Monistat 7   **Si est tomando varias medicinas, por favor revise las etiquetas para Conservation officer, nature los mismos ingredientes [place]. **Tome la medicina segn lo indicado en la etiqueta. **No tome ms de 400 mg de Tylenol en 24 horas. **No tome medicinas que contengan aspirina o ibuprofeno.

## 2021-04-21 ENCOUNTER — Encounter: Payer: Self-pay | Admitting: Obstetrics and Gynecology

## 2021-04-21 LAB — CBC/D/PLT+RPR+RH+ABO+RUBIGG...
Antibody Screen: NEGATIVE
Basophils Absolute: 0 10*3/uL (ref 0.0–0.2)
Basos: 0 %
EOS (ABSOLUTE): 0.1 10*3/uL (ref 0.0–0.4)
Eos: 1 %
HCV Ab: <0.1 s/co ratio (ref 0.0–0.9)
HIV Screen 4th Generation wRfx: NONREACTIVE
Hematocrit: 37.5 % (ref 34.0–46.6)
Hemoglobin: 12.9 g/dL (ref 11.1–15.9)
Hepatitis B Surface Ag: NEGATIVE
Immature Grans (Abs): 0 10*3/uL (ref 0.0–0.1)
Immature Granulocytes: 1 %
Lymphocytes Absolute: 1.9 10*3/uL (ref 0.7–3.1)
Lymphs: 22 %
MCH: 33.2 pg — ABNORMAL HIGH (ref 26.6–33.0)
MCHC: 34.4 g/dL (ref 31.5–35.7)
MCV: 96 fL (ref 79–97)
Monocytes Absolute: 0.4 10*3/uL (ref 0.1–0.9)
Monocytes: 5 %
Neutrophils Absolute: 6.3 10*3/uL (ref 1.4–7.0)
Neutrophils: 71 %
Platelets: 223 10*3/uL (ref 150–450)
RBC: 3.89 x10E6/uL (ref 3.77–5.28)
RDW: 12.3 % (ref 11.7–15.4)
RPR Ser Ql: NONREACTIVE
Rh Factor: POSITIVE
Rubella Antibodies, IGG: 3.02 index (ref >0.99)
WBC: 8.8 10*3/uL (ref 3.4–10.8)

## 2021-04-21 LAB — CERVICOVAGINAL ANCILLARY ONLY
Bacterial Vaginitis (gardnerella): NEGATIVE
Candida Glabrata: NEGATIVE
Candida Vaginitis: NEGATIVE
Chlamydia: NEGATIVE
Comment: NEGATIVE
Comment: NEGATIVE
Comment: NEGATIVE
Comment: NEGATIVE
Comment: NEGATIVE
Comment: NORMAL
Neisseria Gonorrhea: NEGATIVE
Trichomonas: NEGATIVE

## 2021-04-21 LAB — HEMOGLOBIN A1C
Est. average glucose Bld gHb Est-mCnc: 111 mg/dL
Hgb A1c MFr Bld: 5.5 % (ref 4.8–5.6)

## 2021-04-21 LAB — HCV INTERPRETATION

## 2021-04-22 LAB — URINE CULTURE, OB REFLEX

## 2021-04-22 LAB — CULTURE, OB URINE

## 2021-04-24 ENCOUNTER — Other Ambulatory Visit: Payer: Self-pay | Admitting: Obstetrics and Gynecology

## 2021-04-24 MED ORDER — IBUPROFEN 600 MG PO TABS: 600.0000 mg | ORAL_TABLET | Freq: Four times a day (QID) | ORAL | Status: DC

## 2021-04-25 ENCOUNTER — Ambulatory Visit: Payer: Self-pay | Admitting: *Deleted

## 2021-04-25 ENCOUNTER — Encounter: Payer: Self-pay | Admitting: *Deleted

## 2021-04-25 ENCOUNTER — Other Ambulatory Visit: Payer: Self-pay | Admitting: *Deleted

## 2021-04-25 ENCOUNTER — Other Ambulatory Visit: Payer: Self-pay

## 2021-04-25 ENCOUNTER — Telehealth (HOSPITAL_COMMUNITY): Payer: Self-pay | Admitting: *Deleted

## 2021-04-25 ENCOUNTER — Ambulatory Visit: Payer: Self-pay | Attending: Family Medicine

## 2021-04-25 ENCOUNTER — Encounter (HOSPITAL_COMMUNITY): Payer: Self-pay | Admitting: *Deleted

## 2021-04-25 VITALS — BP 111/73 | HR 78

## 2021-04-25 DIAGNOSIS — N883 Incompetence of cervix uteri: Secondary | ICD-10-CM | POA: Insufficient documentation

## 2021-04-25 DIAGNOSIS — O09522 Supervision of elderly multigravida, second trimester: Secondary | ICD-10-CM | POA: Insufficient documentation

## 2021-04-25 DIAGNOSIS — O3430 Maternal care for cervical incompetence, unspecified trimester: Secondary | ICD-10-CM

## 2021-04-25 DIAGNOSIS — O0990 Supervision of high risk pregnancy, unspecified, unspecified trimester: Secondary | ICD-10-CM

## 2021-04-25 DIAGNOSIS — O09521 Supervision of elderly multigravida, first trimester: Secondary | ICD-10-CM | POA: Insufficient documentation

## 2021-04-25 DIAGNOSIS — O09529 Supervision of elderly multigravida, unspecified trimester: Secondary | ICD-10-CM

## 2021-04-25 NOTE — Telephone Encounter (Signed)
Interpreter number [phone]

## 2021-04-25 NOTE — Telephone Encounter (Signed)
No response from patient. Letter was mailed and visible on My Chart. Scheduled with MFM this afternoon.  Encounter closed.

## 2021-05-03 ENCOUNTER — Encounter (HOSPITAL_COMMUNITY): Payer: Self-pay | Admitting: Obstetrics and Gynecology

## 2021-05-04 ENCOUNTER — Inpatient Hospital Stay (HOSPITAL_COMMUNITY): Payer: Self-pay | Admitting: Anesthesiology

## 2021-05-04 ENCOUNTER — Other Ambulatory Visit: Payer: Self-pay

## 2021-05-04 ENCOUNTER — Encounter (HOSPITAL_COMMUNITY)
Admission: RE | Disposition: A | Discharge status: Home / Self Care | Payer: Self-pay | Source: Home / Self Care | Attending: Obstetrics and Gynecology

## 2021-05-04 ENCOUNTER — Inpatient Hospital Stay (HOSPITAL_COMMUNITY)
Admission: RE | Admit: 2021-05-04 | Discharge: 2021-05-04 | DRG: 818 | Disposition: A | Discharge status: Home / Self Care | Payer: Self-pay | Attending: Obstetrics and Gynecology | Admitting: Obstetrics and Gynecology

## 2021-05-04 ENCOUNTER — Encounter (HOSPITAL_COMMUNITY): Payer: Self-pay | Admitting: Obstetrics and Gynecology

## 2021-05-04 DIAGNOSIS — O3430 Maternal care for cervical incompetence, unspecified trimester: Secondary | ICD-10-CM | POA: Diagnosis not present

## 2021-05-04 DIAGNOSIS — N760 Acute vaginitis: Secondary | ICD-10-CM | POA: Diagnosis present

## 2021-05-04 DIAGNOSIS — Z3A13 13 weeks gestation of pregnancy: Secondary | ICD-10-CM

## 2021-05-04 DIAGNOSIS — B379 Candidiasis, unspecified: Secondary | ICD-10-CM | POA: Diagnosis present

## 2021-05-04 DIAGNOSIS — O3432 Maternal care for cervical incompetence, second trimester: Secondary | ICD-10-CM

## 2021-05-04 DIAGNOSIS — Z79899 Other long term (current) drug therapy: Secondary | ICD-10-CM

## 2021-05-04 DIAGNOSIS — Z9889 Other specified postprocedural states: Secondary | ICD-10-CM

## 2021-05-04 DIAGNOSIS — Z86718 Personal history of other venous thrombosis and embolism: Secondary | ICD-10-CM

## 2021-05-04 DIAGNOSIS — O98811 Other maternal infectious and parasitic diseases complicating pregnancy, first trimester: Secondary | ICD-10-CM | POA: Diagnosis present

## 2021-05-04 DIAGNOSIS — Z7982 Long term (current) use of aspirin: Secondary | ICD-10-CM

## 2021-05-04 DIAGNOSIS — O3431 Maternal care for cervical incompetence, first trimester: Principal | ICD-10-CM | POA: Diagnosis present

## 2021-05-04 DIAGNOSIS — O23591 Infection of other part of genital tract in pregnancy, first trimester: Secondary | ICD-10-CM | POA: Diagnosis present

## 2021-05-04 DIAGNOSIS — Z87891 Personal history of nicotine dependence: Secondary | ICD-10-CM

## 2021-05-04 HISTORY — PX: CERVICAL CERCLAGE: SHX1329

## 2021-05-04 LAB — CBC
HCT: 38.2 % (ref 36.0–46.0)
Hemoglobin: 13.4 g/dL (ref 12.0–15.0)
MCH: 33.1 pg (ref 26.0–34.0)
MCHC: 35.1 g/dL (ref 30.0–36.0)
MCV: 94.3 fL (ref 80.0–100.0)
Platelets: 216 10*3/uL (ref 150–400)
RBC: 4.05 MIL/uL (ref 3.87–5.11)
RDW: 12.2 % (ref 11.5–15.5)
WBC: 8.8 10*3/uL (ref 4.0–10.5)
nRBC: 0 % (ref 0.0–0.2)

## 2021-05-04 LAB — TYPE AND SCREEN
ABO/RH(D): O POS
Antibody Screen: NEGATIVE

## 2021-05-04 SURGERY — CERCLAGE, CERVIX, VAGINAL APPROACH
Anesthesia: Spinal

## 2021-05-04 MED ORDER — ONDANSETRON HCL 4 MG/2ML IJ SOLN
INTRAMUSCULAR | Status: DC | PRN
  Administered 2021-05-04: 4 mg via INTRAVENOUS

## 2021-05-04 MED ORDER — CHLOROPROCAINE HCL (PF) 3 % IJ SOLN
INTRAMUSCULAR | Status: DC | PRN
  Administered 2021-05-04: 1 mL via EPIDURAL

## 2021-05-04 MED ORDER — LACTATED RINGERS IV SOLN: INTRAVENOUS | Status: DC

## 2021-05-04 MED ORDER — METRONIDAZOLE 500 MG PO TABS
500.0000 mg | ORAL_TABLET | Freq: Two times a day (BID) | ORAL | 0 refills | Status: AC
  Filled 2021-05-04: qty 13, 7d supply, fill #0

## 2021-05-04 MED ORDER — FLUCONAZOLE 150 MG PO TABS
150.0000 mg | ORAL_TABLET | Freq: Once | ORAL | Status: AC
  Administered 2021-05-04: 150 mg via ORAL
  Filled 2021-05-04: qty 1

## 2021-05-04 MED ORDER — FENTANYL CITRATE (PF) 100 MCG/2ML IJ SOLN
INTRAMUSCULAR | Status: DC | PRN
  Administered 2021-05-04: 15 ug via INTRATHECAL

## 2021-05-04 MED ORDER — FENTANYL CITRATE (PF) 100 MCG/2ML IJ SOLN
INTRAMUSCULAR | Status: AC
  Filled 2021-05-04: qty 2

## 2021-05-04 MED ORDER — CHLOROPROCAINE HCL (PF) 3 % IJ SOLN
INTRAMUSCULAR | Status: AC
  Filled 2021-05-04: qty 20

## 2021-05-04 MED ORDER — CHLOROPROCAINE HCL 50 MG/5ML IT SOLN
INTRATHECAL | Status: AC
  Filled 2021-05-04: qty 5

## 2021-05-04 MED ORDER — KETOROLAC TROMETHAMINE 30 MG/ML IJ SOLN: 30.0000 mg | Freq: Once | INTRAMUSCULAR | Status: DC | PRN

## 2021-05-04 MED ORDER — OXYCODONE HCL 5 MG PO TABS: 5.0000 mg | ORAL_TABLET | Freq: Once | ORAL | Status: DC | PRN

## 2021-05-04 MED ORDER — PROMETHAZINE HCL 25 MG/ML IJ SOLN: 6.2500 mg | INTRAMUSCULAR | Status: DC | PRN

## 2021-05-04 MED ORDER — IBUPROFEN 600 MG PO TABS
600.0000 mg | ORAL_TABLET | Freq: Four times a day (QID) | ORAL | 0 refills | Status: AC
  Filled 2021-05-04: qty 7, 2d supply, fill #0

## 2021-05-04 MED ORDER — CEFAZOLIN SODIUM-DEXTROSE 2-4 GM/100ML-% IV SOLN
2.0000 g | INTRAVENOUS | Status: AC
  Administered 2021-05-04: 2 g via INTRAVENOUS

## 2021-05-04 MED ORDER — OXYCODONE HCL 5 MG/5ML PO SOLN: 5.0000 mg | Freq: Once | ORAL | Status: DC | PRN

## 2021-05-04 MED ORDER — IBUPROFEN 600 MG PO TABS
ORAL_TABLET | ORAL | Status: AC
  Filled 2021-05-04: qty 1

## 2021-05-04 MED ORDER — IBUPROFEN 600 MG PO TABS
600.0000 mg | ORAL_TABLET | Freq: Four times a day (QID) | ORAL | Status: DC
  Administered 2021-05-04: 600 mg via ORAL

## 2021-05-04 MED ORDER — METRONIDAZOLE 500 MG PO TABS
500.0000 mg | ORAL_TABLET | Freq: Two times a day (BID) | ORAL | Status: DC
  Administered 2021-05-04: 500 mg via ORAL
  Filled 2021-05-04: qty 1

## 2021-05-04 MED ORDER — HYDROMORPHONE HCL 1 MG/ML IJ SOLN
0.2500 mg | INTRAMUSCULAR | Status: DC | PRN
Start: 2021-05-04 — End: 2021-05-04

## 2021-05-04 MED ORDER — ONDANSETRON HCL 4 MG/2ML IJ SOLN
INTRAMUSCULAR | Status: AC
  Filled 2021-05-04: qty 2

## 2021-05-04 MED ORDER — CEFAZOLIN SODIUM-DEXTROSE 2-4 GM/100ML-% IV SOLN
INTRAVENOUS | Status: AC
  Filled 2021-05-04: qty 100

## 2021-05-04 MED ORDER — BUPIVACAINE HCL (PF) 0.5 % IJ SOLN
INTRAMUSCULAR | Status: AC
  Filled 2021-05-04: qty 30

## 2021-05-04 SURGICAL SUPPLY — 17 items
CANISTER SUCT 3000ML PPV (MISCELLANEOUS) ×2 IMPLANT
DECANTER SPIKE VIAL GLASS SM (MISCELLANEOUS) IMPLANT
GLOVE BIOGEL PI IND STRL 7.0 (GLOVE) ×2 IMPLANT
GLOVE BIOGEL PI INDICATOR 7.0 (GLOVE) ×2
GLOVE SURG SS PI 7.0 STRL IVOR (GLOVE) ×2 IMPLANT
GOWN STRL REUS W/TWL LRG LVL3 (GOWN DISPOSABLE) ×2 IMPLANT
GOWN STRL REUS W/TWL XL LVL3 (GOWN DISPOSABLE) ×2 IMPLANT
NS IRRIG 1000ML POUR BTL (IV SOLUTION) ×2 IMPLANT
PACK VAGINAL MINOR WOMEN LF (CUSTOM PROCEDURE TRAY) ×2 IMPLANT
PAD OB MATERNITY 4.3X12.25 (PERSONAL CARE ITEMS) ×2 IMPLANT
PAD PREP 24X48 CUFFED NSTRL (MISCELLANEOUS) ×2 IMPLANT
SUT MERSILENE 5MM BP 1 12 (SUTURE) ×2 IMPLANT
SUT SILK 2 0 FSL 18 (SUTURE) ×2 IMPLANT
TOWEL OR 17X24 6PK STRL BLUE (TOWEL DISPOSABLE) ×4 IMPLANT
TRAY FOLEY W/BAG SLVR 14FR (SET/KITS/TRAYS/PACK) ×2 IMPLANT
TUBING NON-CON 1/4 X 20 CONN (TUBING) IMPLANT
YANKAUER SUCT BULB TIP NO VENT (SUCTIONS) IMPLANT

## 2021-05-04 NOTE — Anesthesia Preprocedure Evaluation (Signed)
Anesthesia Evaluation  Patient identified by MRN, date of birth, ID band Patient awake    Reviewed: Allergy & Precautions, H&P , NPO status , Patient's Chart, lab work & pertinent test results  Airway Mallampati: II  TM Distance: >3 FB Neck ROM: full    Dental no notable dental hx. (+) Teeth Intact, Dental Advisory Given   Pulmonary asthma , former smoker,    Pulmonary exam normal breath sounds clear to auscultation       Cardiovascular Exercise Tolerance: Good negative cardio ROS Normal cardiovascular exam Rhythm:regular Rate:Normal     Neuro/Psych negative neurological ROS  negative psych ROS   GI/Hepatic negative GI ROS, Neg liver ROS,   Endo/Other  negative endocrine ROS  Renal/GU negative Renal ROS  negative genitourinary   Musculoskeletal   Abdominal Normal abdominal exam  (+)   Peds  Hematology negative hematology ROS (+)   Anesthesia Other Findings   Reproductive/Obstetrics (+) Pregnancy                             Anesthesia Physical  Anesthesia Plan  ASA: II  Anesthesia Plan: Spinal   Post-op Pain Management:    Induction:   PONV Risk Score and Plan:   Airway Management Planned:   Additional Equipment:   Intra-op Plan:   Post-operative Plan:   Informed Consent: I have reviewed the patients History and Physical, chart, labs and discussed the procedure including the risks, benefits and alternatives for the proposed anesthesia with the patient or authorized representative who has indicated his/her understanding and acceptance.     Dental Advisory Given  Plan Discussed with: CRNA  Anesthesia Plan Comments:         Anesthesia Quick Evaluation

## 2021-05-04 NOTE — Op Note (Signed)
Operative Note   05/04/2021  PRE-OP DIAGNOSIS: Pregnancy at 13/2 weeks. History of cervical insufficiency. Desire for prophylactic cerclage   POST-OP DIAGNOSIS: Same. Yeast infection and bacterial vaginosis  SURGEON: Surgeon(s) and Role:    * Brazen Domangue, Eduard Clos, MD - Primary  ASSISTANT: None  PROCEDURE:  Transvaginal Prophylactic McDonald Cerclage with Mersilene Tape  ANESTHESIA: Spinal  ESTIMATED BLOOD LOSS: 54mL  DRAINS: indwelling foley placed  TOTAL IV FLUIDS: per anesthesia note  SPECIMENS: None  VTE PROPHYLAXIS: SCDs to the bilateral lower extremities  ANTIBIOTICS: Ancef 2gm x 1 pre op  COMPLICATIONS: none  DISPOSITION: PACU - hemodynamically stable.  CONDITION: stable   FINDINGS: Exam under anesthesia revealed normal EGBUS but with bilateral erythema at the labia with white d/c in the vault.  Normal cervix with good length, approximately 4-5cm with os closed.   PROCEDURE IN DETAIL:  After informed consent was obtained, the patient was taken to the operating room where anesthesia was obtained without difficulty. The patient was positioned in the dorsal lithotomy position in [place]. The patient was examined under anesthesia, with the above noted findings.  A weighted speculum was placed and two deavers used for retraction. A Mersilene suture was used to placed a stitch circumferentially starting at 11 o'clock and coming out at 1 o'clock and then tying on the anterior aspect of the cervix; the suture was placed as close to the cervico-vaginal junction as possible on all sides. An air knot was placed on top of the initial knots for easier identification.  Excellent hemostasis was noted, and all instruments were removed, with excellent hemostasis noted throughout.  She was then taken out of dorsal lithotomy. The patient tolerated the procedure well.  Sponge, lap and instrument counts were correct x2.  The patient was taken to recovery room in excellent  condition.  Durene Romans MD Attending Center for [employer] farm manager)

## 2021-05-04 NOTE — Anesthesia Procedure Notes (Signed)
Spinal  Patient location during procedure: OB Start time: 05/04/2021 11:31 AM End time: 05/04/2021 11:36 AM Reason for block: surgical anesthesia Staffing Anesthesiologist: Lynda Rainwater, MD Preanesthetic Checklist Completed: patient identified, IV checked, risks and benefits discussed, surgical consent, monitors and equipment checked, pre-op evaluation and timeout performed Spinal Block Patient position: sitting Prep: DuraPrep and site prepped and draped Patient monitoring: heart rate, cardiac monitor, continuous pulse ox and blood pressure Approach: midline Location: L3-4 Injection technique: single-shot Needle Needle type: Pencan  Needle gauge: 24 G Needle length: 10 cm Assessment Sensory level: T4 Events: CSF return Additional Notes SAB placed by SRNA under direct supervision

## 2021-05-04 NOTE — Discharge Summary (Signed)
Gynecology Discharge Summary Date of Admission: 05/04/2021 Date of Discharge: 05/04/2021  The patient was admitted, as scheduled, and underwent a prophylactic transvaginal mcdonald cerclage with mersilene tape at 13 weeks; please refer to operative note for full details.  She was meeting all post op goals and discharged to home from the PACU  Allergies as of 05/04/2021   No Known Allergies      Medication List     TAKE these medications    acetaminophen 325 MG tablet Commonly known as: Tylenol Take 2 tablets (650 mg total) by mouth every 4 (four) hours as needed (for pain scale < 4).   aspirin EC 81 MG tablet Take 1 tablet (81 mg total) by mouth daily. Take after 12 weeks for prevention of preeclampsia later in pregnancy   cetirizine 10 MG tablet Commonly known as: ZYRTEC TOME 1 PASTILLA POR VIA ORAL A LA HORA DE ACOSTARSE SEGUN SEA NECESARIO PARA CONGESTION NASAL   fluticasone 50 MCG/ACT nasal spray Commonly known as: FLONASE COLOCAR 2 SPRAYS EN CADA FOSA NASAL   ibuprofen 600 MG tablet Commonly known as: ADVIL Take 1 tablet (600 mg total) by mouth every 6 (six) hours for 7 doses. What changed:  medication strength how much to take when to take this reasons to take this   metroNIDAZOLE 500 MG tablet Commonly known as: FLAGYL Take 1 tablet (500 mg total) by mouth 2 (two) times daily for 13 doses.   PRENATAL VITAMINS PO Take 1 tablet by mouth daily.   ProAir RespiClick 035 (90 Base) MCG/ACT Aepb Generic drug: Albuterol Sulfate INHALAR 2 INHALACION IN LOS PULMONES CADA [place]        Future Appointments  Date Time Provider [place]  05/17/2021 10:55 AM Kieth Brightly [facility]  05/24/2021  3:00 PM [facility] NURSE [facility]  05/24/2021  3:15 PM [facility] US2 [facility]  06/05/2021  8:00 AM [facility] NURSE [facility]  06/05/2021  8:15 AM [facility] US2 [facility]  06/21/2021 10:15 AM [facility] NURSE [facility]  06/21/2021 10:30 AM  [facility] US3 [facility] [place]    Durene Romans MD Attending Center for [facility])

## 2021-05-04 NOTE — Transfer of Care (Signed)
Immediate Anesthesia Transfer of Care Note  Patient: Hailey Walsh  Procedure(s) Performed: CERCLAGE CERVICAL  Patient Location: PACU  Anesthesia Type:Spinal  Level of Consciousness: awake, alert  and oriented  Airway & Oxygen Therapy: Patient Spontanous Breathing  Post-op Assessment: Report given to RN and Post -op Vital signs reviewed and stable  Post vital signs: Reviewed and stable  Last Vitals:  Vitals Value Taken Time  BP    Temp    Pulse    Resp    SpO2      Last Pain:  Vitals:   05/04/21 1028  PainSc: 0-No pain         Complications: No notable events documented.

## 2021-05-04 NOTE — H&P (Signed)
Obstetrics & Gynecology Surgical H&P   Date of Surgery: 05/04/2021    Primary OBGYN: [facility]   Reason for Admission: scheduled prophylactic cerclage  History of Present Illness: Ms. Hailey Walsh is a 40 y.o. [id number] @ 13/2 weeks with the above CC. PMHx significant for h/o cervical insufficiency and h/o successful cerclage placenment with subsequent term delivery, AMA, h/o DVT after fracture.   Patient has no concerns for this morning.   ROS: A 12-point review of systems was performed and negative, except as stated in the above HPI.  OBGYN History: As per HPI. OB History  Gravida Para Term Preterm AB Living  5 2 2  0 2 2  SAB IAB Ectopic Multiple Live Births  2 0 0 0 2    # Outcome Date GA Lbr Len/2nd Weight Sex Delivery Anes PTL Lv  5 Current           4 Term 10/09/16 [redacted]w[redacted]d 05:00 / 00:15 3090 g F Vag-Spont None  LIV  3 Term 01/2011 [redacted]w[redacted]d  3317 g F Vag-Spont EPI  LIV     Birth Comments: had cerclage  2 SAB 06/2009 [redacted]w[redacted]d         1 SAB 06/2008 [redacted]w[redacted]d            Birth Comments: System Generated. Please review and update pregnancy details.    Past Medical History: Past Medical History:  Diagnosis Date   Anxiety    patient denies anxiety   Depression    no meds currently   DVT (deep venous thrombosis) ([place]) 11/29/2014   right lower leg   Incompetent cervix 10/29/2009    Past Surgical History: Past Surgical History:  Procedure Laterality Date   ABDOMINAL SURGERY     CERVICAL CERCLAGE  10/16/2010   CERVICAL CERCLAGE N/A 04/24/2016   Procedure: CERCLAGE CERVICAL;  Surgeon: Donnamae Jude, MD;  Location: [facility];  Service: Gynecology;  Laterality: N/A;   HARDWARE REMOVAL Right 05/17/2015   Procedure: HARDWARE REMOVAL RIGHT ILIAC SCREW;  Surgeon: Altamese [place], MD;  Location: [facility];  Service: Orthopedics;  Laterality: Right;   ORIF WRIST FRACTURE Left 12/17/2014   Procedure: OPEN REDUCTION INTERNAL FIXATION (ORIF) WRIST FRACTURE;   Surgeon: Roseanne Kaufman, MD;  Location: [facility];  Service: Orthopedics;  Laterality: Left;   SACRO-ILIAC PINNING Left 12/17/2014   Procedure: Dub Mikes;  Surgeon: Rozanna Box, MD;  Location: [place] [facility];  Service: Orthopedics;  Laterality: Left;    Family History:  Family History  Problem Relation Age of Onset   Cancer Mother    Hypertension Mother    Colon cancer Mother    Diabetes Brother    Uterine cancer Sister     Social History:  Social History   Socioeconomic History   Marital status: Single    Spouse name: Not on file   Number of children: Not on file   Years of education: Not on file   Highest education level: Not on file  Occupational History   Not on file  Tobacco Use   Smoking status: Former    Pack years: 0.00    Types: Cigarettes    Quit date: 02/26/2021    Years since quitting: 0.1   Smokeless tobacco: Never   Tobacco comments:    Intermittent smoker-occasional  Vaping Use   Vaping Use: Never used  Substance and Sexual Activity   Alcohol use: Not Currently    Comment: special events only   Drug use: No  Sexual activity: Yes    Birth control/protection: Condom  Other Topics Concern   Not on file  Social History Narrative   Not on file   Social Determinants of Health   Financial Resource Strain: Not on file  Food Insecurity: No Food Insecurity   Worried About Charity fundraiser in the Last Year: Never true   Ran Out of Food in the Last Year: Never true  Transportation Needs: No Transportation Needs   Lack of Transportation (Medical): No   Lack of Transportation (Non-Medical): No  Physical Activity: Not on file  Stress: Not on file  Social Connections: Not on file  Intimate Partner Violence: Not on file     Allergy: No Known Allergies  Current Outpatient Medications: Medications Prior to Admission  Medication Sig Dispense Refill Last Dose   acetaminophen (TYLENOL) 325 MG tablet Take 2 tablets (650 mg total) by mouth every 4 (four)  hours as needed (for pain scale < 4). 30 tablet 0    aspirin EC 81 MG tablet Take 1 tablet (81 mg total) by mouth daily. Take after 12 weeks for prevention of preeclampsia later in pregnancy 300 tablet 2 05/03/2021   Prenatal Vit-Fe Fumarate-FA (PRENATAL VITAMINS PO) Take 1 tablet by mouth daily.   05/03/2021   Albuterol Sulfate (PROAIR RESPICLICK) 097 (90 Base) MCG/ACT AEPB INHALAR 2 INHALACION IN LOS PULMONES CADA 6 HORAS SEGUN SEA NECESARIO 1 each 4    cetirizine (ZYRTEC) 10 MG tablet TOME 1 PASTILLA POR VIA ORAL A LA HORA DE ACOSTARSE SEGUN SEA NECESARIO PARA CONGESTION NASAL 30 tablet 3    fluticasone (FLONASE) 50 MCG/ACT nasal spray COLOCAR 2 SPRAYS EN CADA FOSA NASAL 16 g 6    ibuprofen (ADVIL,MOTRIN) 800 MG tablet Take 1 tablet (800 mg total) by mouth every 8 (eight) hours as needed. 21 tablet 0      Hospital Medications: Current Facility-Administered Medications  Medication Dose Route Frequency Provider Last Rate Last Admin   ceFAZolin (ANCEF) IVPB 2g/100 mL premix  2 g Intravenous On Call to [place], MD       lactated ringers infusion   Intravenous Continuous Aletha Halim, MD         Physical Exam:  Current Vital Signs 24h Vital Sign Ranges  T   No data recorded  BP   No data recorded  HR   No data recorded  RR   No data recorded  SaO2     No data recorded       24 Hour I/O Current Shift I/O  Time Ins Outs No intake/output data recorded. No intake/output data recorded.   FHTs 160s by bedside u/s, +FM, subjectively normal AF  Body mass index is 35.08 kg/m. General appearance: Well nourished, well developed female in no acute distress.  Cardiovascular: S1, S2 normal, no murmur, rub or gallop, regular rate and rhythm Respiratory:  Clear to auscultation bilateral. Normal respiratory effort Abdomen: nttp Neuro/Psych:  Normal mood and affect.  Skin:  Warm and dry.  Extremities: no clubbing, cyanosis, or edema.   Laboratory: Cbc and type and screen  pending   Assessment: Ms. Hailey Walsh is a 40 y.o. here for prophylactic cerclage. Pt doing well  Plan: D/w pt and she is amenable to prophylactic cerclage. Can proceed when labs are back. Interpreter used.   Interpreter used  Aletha Halim, Brooke Bonito MD Attending [facility] [employer] [facility] (Faculty Practice) GYN Consult Phone: [phone] (M-F, 0800-1700) & [phone] (Off hours, weekends, holidays)

## 2021-05-04 NOTE — Anesthesia Postprocedure Evaluation (Signed)
Anesthesia Post Note  Patient: Hailey Walsh  Procedure(s) Performed: CERCLAGE CERVICAL     Patient location during evaluation: PACU Anesthesia Type: Spinal Level of consciousness: awake and alert Pain management: pain level controlled Vital Signs Assessment: post-procedure vital signs reviewed and stable Respiratory status: spontaneous breathing, nonlabored ventilation and respiratory function stable Cardiovascular status: blood pressure returned to baseline and stable Postop Assessment: no apparent nausea or vomiting Anesthetic complications: no   No notable events documented.  Last Vitals:  Vitals:   05/04/21 1400 05/04/21 1412  BP: 116/79 133/83  Pulse: 89 89  Resp: 14 18  Temp: 37.1 C   SpO2: 100% 100%    Last Pain:  Vitals:   05/04/21 1412  TempSrc:   PainSc: 0-No pain   Pain Goal:    LLE Motor Response: Purposeful movement (05/04/21 1412) LLE Sensation: Tingling (05/04/21 1412) RLE Motor Response: Purposeful movement (05/04/21 1412) RLE Sensation: Tingling (05/04/21 1412)     Epidural/Spinal Function Cutaneous sensation: Tingles (05/04/21 1412), Patient able to flex knees: Yes (05/04/21 1412), Patient able to lift hips off bed: Yes (05/04/21 1412), Back pain beyond tenderness at insertion site: No (05/04/21 1412), Progressively worsening motor and/or sensory loss: No (05/04/21 1412), Bowel and/or bladder incontinence post epidural: No (05/04/21 1412)  Lynda Rainwater

## 2021-05-17 ENCOUNTER — Other Ambulatory Visit: Payer: Self-pay

## 2021-05-17 ENCOUNTER — Ambulatory Visit (INDEPENDENT_AMBULATORY_CARE_PROVIDER_SITE_OTHER): Payer: Self-pay | Admitting: Advanced Practice Midwife

## 2021-05-17 VITALS — BP 115/80 | HR 89 | Wt 176.0 lb

## 2021-05-17 DIAGNOSIS — O26892 Other specified pregnancy related conditions, second trimester: Secondary | ICD-10-CM

## 2021-05-17 DIAGNOSIS — Z8781 Personal history of (healed) traumatic fracture: Secondary | ICD-10-CM

## 2021-05-17 DIAGNOSIS — Z9889 Other specified postprocedural states: Secondary | ICD-10-CM

## 2021-05-17 DIAGNOSIS — Z3A15 15 weeks gestation of pregnancy: Secondary | ICD-10-CM

## 2021-05-17 DIAGNOSIS — Z789 Other specified health status: Secondary | ICD-10-CM

## 2021-05-17 DIAGNOSIS — O0990 Supervision of high risk pregnancy, unspecified, unspecified trimester: Secondary | ICD-10-CM

## 2021-05-17 DIAGNOSIS — R519 Headache, unspecified: Secondary | ICD-10-CM

## 2021-05-17 MED ORDER — DOCUSATE SODIUM 100 MG PO CAPS
100.0000 mg | ORAL_CAPSULE | Freq: Two times a day (BID) | ORAL | 0 refills | Status: DC
  Filled 2021-05-17: qty 10, 5d supply, fill #0

## 2021-05-17 MED ORDER — POLYETHYLENE GLYCOL 3350 17 G PO PACK
17.0000 g | PACK | Freq: Every day | ORAL | 0 refills | Status: DC
  Filled 2021-05-17: qty 14, 14d supply, fill #0

## 2021-05-17 NOTE — Progress Notes (Signed)
   PRENATAL VISIT NOTE  Subjective:  Hailey Walsh is a 40 y.o. [id number] at [redacted]w[redacted]d being seen today for ongoing prenatal care.  She is currently monitored for the following issues for this high-risk pregnancy and has History of DVT of lower extremity; History of pelvic fracture; Cough variant asthma; Pulmonary nodule; Supervision of high risk pregnancy, antepartum; AMA (advanced maternal age) multigravida 35+; Cervical insufficiency during pregnancy, antepartum; History of cervical cerclage; and Cervical cerclage suture present on their problem list.  Patient reports  pelvic and low back pain in setting of hx of pelvic fracture . Patient states she typically sleeps in fetal position because the pain in so intense. Daily physical activity if light to moderate, consisting mostly of caring for children and household chores.  Contractions: Not present. Vag. Bleeding: None.  Movement: Absent. Denies leaking of fluid.   Patient also endorses recurrent moderate headache. She states she had a moderate to severe headache for almost one week following her cerclage placement. Pain was not responsive to Tylenol.  The following portions of the patient's history were reviewed and updated as appropriate: allergies, current medications, past family history, past medical history, past social history, past surgical history and problem list. Problem list updated.  Objective:   Vitals:   05/17/21 1126  BP: 115/80  Pulse: 89  Weight: 176 lb (79.8 kg)    Fetal Status: Fetal Heart Rate (bpm): 144   Movement: Absent     General:  Alert, oriented and cooperative. Patient is in no acute distress.  Skin: Skin is warm and dry. No rash noted.   Cardiovascular: Normal heart rate noted  Respiratory: Normal respiratory effort, no problems with respiration noted  Abdomen: Soft, gravid, appropriate for gestational age.  Pain/Pressure: Present     Pelvic: Cervical exam deferred        Extremities: Normal range of  motion.  Edema: None  Mental Status: Normal mood and affect. Normal behavior. Normal judgment and thought content.   Assessment and Plan:  Pregnancy: [id number] at [redacted]w[redacted]d  1. Supervision of high risk pregnancy, antepartum - Routine care  2. [redacted] weeks gestation of pregnancy  3. History of pelvic fracture - Ambulatory referral to Physical Therapy  4. History of cervical cerclage - Cerclage plaxed 07/07  5. Headache in pregnancy - Amb referral to [facility] for further evaluation of headaches  Preterm labor symptoms and general obstetric precautions including but not limited to vaginal bleeding, contractions, leaking of fluid and fetal movement were reviewed in detail with the patient. Please refer to After Visit Summary for other counseling recommendations.  Return in about 4 weeks (around 06/14/2021).  Future Appointments  Date Time Provider [place]  05/24/2021  3:00 PM [facility] NURSE [facility]  05/24/2021  3:15 PM [facility]  06/05/2021  8:00 AM [facility] NURSE [facility]  06/05/2021  8:15 AM [facility]2 [facility]  06/06/2021 10:35 AM [name], NP [facility]  06/21/2021 10:15 AM [facility] NURSE [facility]  06/21/2021 10:30 AM [facility] US3 [facility] [place]    Darlina Rumpf, CNM

## 2021-05-18 ENCOUNTER — Other Ambulatory Visit: Payer: Self-pay

## 2021-05-19 ENCOUNTER — Telehealth: Payer: Self-pay | Admitting: *Deleted

## 2021-05-19 NOTE — Telephone Encounter (Signed)
I called Hailey Walsh with Interpreter Laural Golden to notify of her appointment with Headache specialist 07/21/21 at [place] with Allie Dimmer. She voices understanding. Chudney Scheffler,RN

## 2021-05-24 ENCOUNTER — Other Ambulatory Visit: Payer: Self-pay

## 2021-05-24 ENCOUNTER — Ambulatory Visit: Payer: Self-pay | Admitting: *Deleted

## 2021-05-24 ENCOUNTER — Ambulatory Visit: Payer: Self-pay | Attending: Obstetrics

## 2021-05-24 ENCOUNTER — Encounter: Payer: Self-pay | Admitting: *Deleted

## 2021-05-24 VITALS — BP 122/73 | HR 71

## 2021-05-24 DIAGNOSIS — O3430 Maternal care for cervical incompetence, unspecified trimester: Secondary | ICD-10-CM

## 2021-05-24 DIAGNOSIS — O09529 Supervision of elderly multigravida, unspecified trimester: Secondary | ICD-10-CM | POA: Insufficient documentation

## 2021-05-24 DIAGNOSIS — O0990 Supervision of high risk pregnancy, unspecified, unspecified trimester: Secondary | ICD-10-CM

## 2021-06-05 ENCOUNTER — Ambulatory Visit: Payer: Self-pay

## 2021-06-06 ENCOUNTER — Telehealth (INDEPENDENT_AMBULATORY_CARE_PROVIDER_SITE_OTHER): Payer: Self-pay | Admitting: Nurse Practitioner

## 2021-06-06 DIAGNOSIS — Z3A18 18 weeks gestation of pregnancy: Secondary | ICD-10-CM

## 2021-06-06 DIAGNOSIS — O09522 Supervision of elderly multigravida, second trimester: Secondary | ICD-10-CM

## 2021-06-06 DIAGNOSIS — O3430 Maternal care for cervical incompetence, unspecified trimester: Secondary | ICD-10-CM

## 2021-06-06 DIAGNOSIS — O09521 Supervision of elderly multigravida, first trimester: Secondary | ICD-10-CM

## 2021-06-06 DIAGNOSIS — O3432 Maternal care for cervical incompetence, second trimester: Secondary | ICD-10-CM

## 2021-06-06 DIAGNOSIS — O0990 Supervision of high risk pregnancy, unspecified, unspecified trimester: Secondary | ICD-10-CM

## 2021-06-06 NOTE — Progress Notes (Signed)
OBSTETRICS PRENATAL VIRTUAL VISIT ENCOUNTER NOTE  Provider location: [facility]   Patient location: Home  I connected with Hailey Walsh on 06/06/21 at 10:35 AM EDT by MyChart Video Encounter and verified that I am speaking with the correct person using two identifiers. I discussed the limitations, risks, security and privacy concerns of performing an evaluation and management service virtually and the availability of in person appointments. I also discussed with the patient that there may be a patient responsible charge related to this service. The patient expressed understanding and agreed to proceed. Subjective:  Hailey Walsh is a 40 y.o. [id number] at 45w0dbeing seen today for ongoing prenatal care.  She is currently monitored for the following issues for this high-risk pregnancy and has History of DVT of lower extremity; History of pelvic fracture; Cough variant asthma; Pulmonary nodule; Supervision of high risk pregnancy, antepartum; AMA (advanced maternal age) multigravida 35+; Cervical insufficiency during pregnancy, antepartum; History of cervical cerclage; and Cervical cerclage suture present on their problem list.  Patient reports  vaginal discharge - sometimes clear, sometimes white and sometimes green in color and experiencing more lower abdominal pain than she did in a previous pregnancy.  No fever .  Contractions: Not present. Vag. Bleeding: None.  Movement: Present. Denies any leaking of fluid.  Patient was unsure why she had a virtual visit today.  Wants to come in person.  Interpreter on the visit today for the entire visit.  The following portions of the patient's history were reviewed and updated as appropriate: allergies, current medications, past family history, past medical history, past social history, past surgical history and problem list.   Objective:  There were no vitals filed for this visit.  Fetal Status:      Movement: Present     General:  Alert, oriented and cooperative. Patient is in no acute distress.  Respiratory: Normal respiratory effort, no problems with respiration noted  Mental Status: Normal mood and affect. Normal behavior. Normal judgment and thought content.  Rest of physical exam deferred due to type of encounter  Imaging: UKoreaMFM OB Transvaginal  Result Date: 05/24/2021 ----------------------------------------------------------------------  OBSTETRICS REPORT                       (Signed Final 05/24/2021 04:27 pm) ---------------------------------------------------------------------- Patient Info  ID #:       [id number]                         D.O.B.:  006-Sep-1982(40 yrs)  Name:       Hailey Walsh                  Visit Date: 05/24/2021 11:30 am              MIRALRIO ---------------------------------------------------------------------- Performed By  Attending:        RTama HighMD        Ref. Address:     [address] Performed By:     CRodrigo RanBS      [facility]                                 [facility] at                                                             [facility]                                                             [facility]  Referred By:      Caren Macadam MD ---------------------------------------------------------------------- Orders  #  Description                           Code        Ordered By  1  [place] MFM OB LIMITED                     76815.01    YU FANG  2  [place] MFM OB TRANSVAGINAL                [phone]     YU FANG ----------------------------------------------------------------------  #  Order #                     Accession #                Episode #  1  [id number]                   [id number]                 [id number]  2  [id number]                   [id number]                 [id number]  ---------------------------------------------------------------------- Indications  Encounter for cervical length                  Z36.86  Advanced maternal age multigravida 97,         O09.522  second trimester  Cervical incompetence, second trimester        O34.32  (cerclage placed 0000000)  Obesity complicating pregnancy, second         O99.212  trimester (pregravid BMI 34)  [redacted] weeks gestation of pregnancy                Z3A.16 ---------------------------------------------------------------------- Fetal Evaluation  Num Of Fetuses:         1  Fetal Heart Rate(bpm):  148  Cardiac Activity:       Observed  Presentation:           Transverse, head to maternal left  Amniotic Fluid  AFI FV:  Within normal limits                              Largest Pocket(cm)                              5.6 ---------------------------------------------------------------------- OB History  Gravidity:    5  Living:       2 ---------------------------------------------------------------------- Gestational Age  LMP:           17w 2d        Date:  01/23/21                 EDD:   10/30/21  Best:          16w 1d     Det. By:  U/S C R L  (04/25/21)    EDD:   11/07/21 ---------------------------------------------------------------------- Cervix Uterus Adnexa  Cervix  Length:            3.7  cm.  Normal appearance by transvaginal scan Cerclage visualized.  Uterus  No abnormality visualized.  Right Ovary  Not visualized.  Left Ovary  Not visualized.  Cul De Sac  No free fluid seen.  Adnexa  No abnormality visualized. ---------------------------------------------------------------------- Impression  Patient had prophylactic cerclage.  She returned for  transvaginal assessment.  Amniotic fluid is normal good fetal activity seen.  On  transvaginal ultrasound, the cervix measures 3.7 cm (1.2 cm  from the stitched to the external os), which is within normal  range.  I counseled and reassured the patient with help of Spanish  language interpreter  present in the room. ---------------------------------------------------------------------- Recommendations  Fetal anatomy scan in 4 weeks. ----------------------------------------------------------------------                  Tama High, MD Electronically Signed Final Report   05/24/2021 04:27 pm ----------------------------------------------------------------------  [place] MFM OB LIMITED  Result Date: 05/24/2021 ----------------------------------------------------------------------  OBSTETRICS REPORT                       (Signed Final 05/24/2021 04:27 pm) ---------------------------------------------------------------------- Patient Info  ID #:       [id number]                          D.O.B.:  [birth year] (40 yrs)  Name:       Hailey TO-                  Visit Date: 05/24/2021 11:30 am              MIRALRIO ---------------------------------------------------------------------- Performed By  Attending:        Tama High MD        Ref. Address:     [place]  Performed By:     Rodrigo Ran BS      [facility]                                 Fetal Care at                                                             [place] for                                                             Women  Referred By:      Caren Macadam MD ---------------------------------------------------------------------- Orders  #  Description                           Code        Ordered By  1  [place] MFM OB LIMITED                     76815.01    YU FANG  2  [place] MFM OB TRANSVAGINAL                [phone]     YU FANG ----------------------------------------------------------------------  #  Order #                     Accession #                Episode #  1  [id number]                   [id number]                 [id number]  2  [id number]                   [id number]                  [id number] ---------------------------------------------------------------------- Indications  Encounter for cervical length                  Z36.86  Advanced maternal age multigravida 10,         O09.522  second trimester  Cervical incompetence, second trimester        O34.32  (cerclage placed 0000000)  Obesity complicating pregnancy, second         O99.212  trimester (pregravid BMI 34)  [redacted] weeks gestation of pregnancy                Z3A.16 ---------------------------------------------------------------------- Fetal Evaluation  Num Of Fetuses:         1  Fetal Heart Rate(bpm):  148  Cardiac Activity:       Observed  Presentation:           Transverse, head to maternal left  Amniotic Fluid  AFI FV:      Within normal limits  Largest Pocket(cm)                              5.6 ---------------------------------------------------------------------- OB History  Gravidity:    5  Living:       2 ---------------------------------------------------------------------- Gestational Age  LMP:           17w 2d        Date:  01/23/21                 EDD:   10/30/21  Best:          16w 1d     Det. By:  U/S C R L  (04/25/21)    EDD:   11/07/21 ---------------------------------------------------------------------- Cervix Uterus Adnexa  Cervix  Length:            3.7  cm.  Normal appearance by transvaginal scan Cerclage visualized.  Uterus  No abnormality visualized.  Right Ovary  Not visualized.  Left Ovary  Not visualized.  Cul De Sac  No free fluid seen.  Adnexa  No abnormality visualized. ---------------------------------------------------------------------- Impression  Patient had prophylactic cerclage.  She returned for  transvaginal assessment.  Amniotic fluid is normal good fetal activity seen.  On  transvaginal ultrasound, the cervix measures 3.7 cm (1.2 cm  from the stitched to the external os), which is within normal  range.  I counseled and reassured the patient with help of Spanish  language  interpreter present in the room. ---------------------------------------------------------------------- Recommendations  Fetal anatomy scan in 4 weeks. ----------------------------------------------------------------------                  Tama High, MD Electronically Signed Final Report   05/24/2021 04:27 pm ----------------------------------------------------------------------   Assessment and Plan:  Pregnancy: [id number] at 67w0d1. Supervision of high risk pregnancy, antepartum Needs to come to clinic to be evaluated more fully for vaginal discharge and lower abdominal pain that she states is worse in this pregnancy than previously when she had a cerclage 5 years ago.  2. Multigravida of advanced maternal age in first trimester Has not had genetic testing or AFP.  Is unsure if she wants these.  She states her husband definitely wants these done.  Advised that for Quad/AFP this is the window of time in the pregnancy for these to be drawn.  3. Cervical insufficiency during pregnancy, antepartum Cerclage place in July 2022  Preterm labor symptoms and general obstetric precautions including but not limited to vaginal bleeding, contractions, leaking of fluid and fetal movement were reviewed in detail with the patient. I discussed the assessment and treatment plan with the patient. The patient was provided an opportunity to ask questions and all were answered. The patient agreed with the plan and demonstrated an understanding of the instructions. The patient was advised to call back or seek an in-person office evaluation[facility] Please refer to After Visit Summary for other counseling recommendations.   I provided 10 minutes of face-Walshface time during this encounter.  Return in about 2 days (around 06/08/2021) for with MD in persom.  Future Appointments  Date Time Provider [place] 06/26/2021  2:[facility] 06/26/2021  2:15 PM [facility] 07/21/2021  9:30 AM [place] PA-C [facility]    [place] NP [facility]

## 2021-06-16 ENCOUNTER — Encounter: Payer: Self-pay | Admitting: Family

## 2021-06-21 ENCOUNTER — Ambulatory Visit: Payer: Self-pay

## 2021-06-23 ENCOUNTER — Other Ambulatory Visit: Payer: Self-pay | Admitting: Family Medicine

## 2021-06-23 DIAGNOSIS — Z8781 Personal history of (healed) traumatic fracture: Secondary | ICD-10-CM

## 2021-06-23 DIAGNOSIS — N883 Incompetence of cervix uteri: Secondary | ICD-10-CM

## 2021-06-23 DIAGNOSIS — Z86718 Personal history of other venous thrombosis and embolism: Secondary | ICD-10-CM

## 2021-06-23 DIAGNOSIS — O09529 Supervision of elderly multigravida, unspecified trimester: Secondary | ICD-10-CM

## 2021-06-23 DIAGNOSIS — O0990 Supervision of high risk pregnancy, unspecified, unspecified trimester: Secondary | ICD-10-CM

## 2021-06-26 ENCOUNTER — Other Ambulatory Visit: Payer: Self-pay

## 2021-06-26 ENCOUNTER — Ambulatory Visit: Payer: Self-pay

## 2021-07-04 ENCOUNTER — Telehealth: Payer: Self-pay

## 2021-07-04 NOTE — Telephone Encounter (Signed)
Patient is wanting to know if she can get something for diarrhea  and wanting to know if we can send something in to her pharmacy     Please calll and advise

## 2021-07-04 NOTE — Telephone Encounter (Signed)
Called pt with interpreter Raquel. Pt reports diarrhea for last 4 days. Only other symptoms was body aches on day 1 that resolved with Tylenol. Recommended otc Imodium. Instructions given. Pt states she is at [facility] now and will pick up. MyChart password reset. Message sent with medication names. Patient needs to be scheduled for routine prenatal appointment.

## 2021-07-07 ENCOUNTER — Institutional Professional Consult (permissible substitution): Payer: Self-pay | Admitting: Physician Assistant

## 2021-07-13 ENCOUNTER — Other Ambulatory Visit: Payer: Self-pay | Admitting: *Deleted

## 2021-07-13 ENCOUNTER — Ambulatory Visit: Payer: Self-pay | Admitting: *Deleted

## 2021-07-13 ENCOUNTER — Ambulatory Visit: Payer: Self-pay | Attending: Maternal & Fetal Medicine

## 2021-07-13 VITALS — BP 108/71 | HR 77

## 2021-07-13 DIAGNOSIS — O3430 Maternal care for cervical incompetence, unspecified trimester: Secondary | ICD-10-CM | POA: Insufficient documentation

## 2021-07-13 DIAGNOSIS — O09522 Supervision of elderly multigravida, second trimester: Secondary | ICD-10-CM

## 2021-07-13 DIAGNOSIS — Z86718 Personal history of other venous thrombosis and embolism: Secondary | ICD-10-CM

## 2021-07-13 DIAGNOSIS — O09529 Supervision of elderly multigravida, unspecified trimester: Secondary | ICD-10-CM | POA: Insufficient documentation

## 2021-07-13 DIAGNOSIS — Z8781 Personal history of (healed) traumatic fracture: Secondary | ICD-10-CM | POA: Insufficient documentation

## 2021-07-13 DIAGNOSIS — O0990 Supervision of high risk pregnancy, unspecified, unspecified trimester: Secondary | ICD-10-CM | POA: Insufficient documentation

## 2021-07-13 DIAGNOSIS — R638 Other symptoms and signs concerning food and fluid intake: Secondary | ICD-10-CM

## 2021-07-13 DIAGNOSIS — N883 Incompetence of cervix uteri: Secondary | ICD-10-CM | POA: Insufficient documentation

## 2021-07-21 ENCOUNTER — Other Ambulatory Visit: Payer: Self-pay

## 2021-07-21 ENCOUNTER — Encounter: Payer: Self-pay | Admitting: Physician Assistant

## 2021-07-21 ENCOUNTER — Ambulatory Visit (INDEPENDENT_AMBULATORY_CARE_PROVIDER_SITE_OTHER): Payer: Self-pay | Admitting: Physician Assistant

## 2021-07-21 VITALS — BP 121/82 | HR 80 | Wt 181.0 lb

## 2021-07-21 DIAGNOSIS — R519 Headache, unspecified: Secondary | ICD-10-CM | POA: Insufficient documentation

## 2021-07-21 DIAGNOSIS — G43009 Migraine without aura, not intractable, without status migrainosus: Secondary | ICD-10-CM | POA: Insufficient documentation

## 2021-07-21 DIAGNOSIS — O26892 Other specified pregnancy related conditions, second trimester: Secondary | ICD-10-CM | POA: Insufficient documentation

## 2021-07-21 MED ORDER — BUTALBITAL-APAP-CAFFEINE 50-325-40 MG PO CAPS: 1.0000 | ORAL_CAPSULE | Freq: Four times a day (QID) | ORAL | 3 refills | Status: DC | PRN

## 2021-07-21 MED ORDER — BUTALBITAL-APAP-CAFFEINE 50-325-40 MG PO TABS
ORAL_TABLET | Freq: Four times a day (QID) | ORAL | 3 refills | Status: DC | PRN
  Filled 2021-07-21: qty 30, 8d supply, fill #0

## 2021-07-21 MED ORDER — CYCLOBENZAPRINE HCL 10 MG PO TABS
10.0000 mg | ORAL_TABLET | Freq: Three times a day (TID) | ORAL | 2 refills | Status: DC | PRN
  Filled 2021-07-21: qty 30, 10d supply, fill #0

## 2021-07-21 NOTE — Patient Instructions (Signed)
Cefalea migraosa Migraine Headache Una cefalea migraosa es un dolor muy intenso y punzante en uno o ambos lados de la cabeza. Este tipo de dolor de cabeza tambin puede causar otros sntomas. Puede durar desde 4 horas [employer]. Hable con su mdico sobre las cosas que pueden causar (desencadenar) esta afeccin. Cules son las causas? Se desconoce la causa exacta de esta afeccin. Esta afeccin puede desencadenarse o ser causada por lo siguiente: Consumo de alcohol. Consumo de cigarrillos. Tomar medicamentos como por ejemplo: Medicamentos para Air cabin crew (nitroglicerina). Anticonceptivos orales. Estrgeno. Algunos medicamentos para la presin arterial. [place] o beber ciertos productos. Hacer actividad fsica. Otros factores que pueden provocar cefalea migraosa son los siguientes: Tener el perodo menstrual. Hailey Walsh. Hambre. Estrs. No dormir lo suficiente o dormir demasiado. Cambios climticos. Cansancio (fatiga). Qu incrementa el riesgo? Tener entre 25 y 42 aos de edad. Ser mujer. Tener antecedentes familiares de cefalea migraosa. Ser de Science writer. Tener depresin o ansiedad. Tener mucho sobrepeso. Cules son los signos o los sntomas? Un dolor punzante. Este dolor puede [employer] siguientes caractersticas: Puede aparecer en cualquier regin de la cabeza, tanto de un lado como de [place]. Puede dificultar las actividades cotidianas. Puede empeorar con la actividad fsica. Puede empeorar con las luces brillantes o los ruidos fuertes. Otros sntomas pueden incluir: Ganas de vomitar (nuseas). Vmitos. Mareos. Sensibilidad a las [employer], los ruidos fuertes o [employer]. Antes de tener una cefalea migraosa, puede recibir seales de advertencia (aura). Un aura puede incluir: Ver luces intermitentes o tener puntos ciegos. Ver puntos brillantes, halos o lneas en zigzag. Tener una visin en tnel o visin borrosa. Sentir entumecimiento u  hormigueo. Tener dificultad para hablar. Tener msculos dbiles. Algunas personas tienen sntomas despus de una cefalea migraosa (fase posdromal), como los siguientes: Cansancio. Dificultad para pensar (concentrarse). Cmo se trata? Tomar medicamentos para: Best boy. Aliviar la sensacin de Higher education careers adviser. Prevenir las cefaleas migraosas. El tratamiento tambin puede incluir lo siguiente: Tomar sesiones de acupuntura. Evitar los alimentos que provocan las cefaleas migraosas. Aprender [name] de controlar las funciones corporales (biorretroalimentacin). Terapia para ayudarlo a Civil engineer, contracting y Therapist, nutritional con los pensamientos negativos (terapia cognitivo conductual). Siga estas instrucciones en su casa: Medicamentos [employer] de venta libre y los recetados solamente como se lo haya indicado el mdico. Consulte a su mdico si el medicamento que le recetaron: Hace que sea necesario que evite conducir o usar maquinaria pesada. Puede causarle dificultad para defecar (estreimiento). Es posible que deba tomar estas medidas para prevenir o tratar los problemas para defecar: Electronics engineer suficiente lquido para Contractor pis (la orina) de color amarillo plido. Tomar medicamentos recetados o de [employer]. [place] alimentos ricos en fibra. Entre ellos, frijoles, cereales integrales y frutas y verduras frescas. Limitar los alimentos con alto contenido de grasa y Location manager. Estos incluyen alimentos fritos o dulces. Estilo de vida No beba alcohol. No consuma ningn producto que contenga nicotina o tabaco, como cigarrillos, cigarrillos electrnicos y tabaco de Higher education careers adviser. Si necesita ayuda para dejar de fumar, consulte al mdico. Duerma como mnimo 8 horas todas las noches. Limite el estrs y [place]. Indicaciones generales   Lleve un registro diario para Neurosurgeon lo que Financial trader. Registre, por ejemplo, lo siguiente: Lo que usted come y bebe. El tiempo que  duerme. Algn cambio en lo que come o bebe. Algn cambio en sus medicamentos. Si tiene una cefalea migraosa: Evite los factores que [employer] sntomas, como las luces brillantes. Resulta  til acostarse en una habitacin oscura y silenciosa. No conduzca vehculos ni opere maquinaria pesada. Pregntele al mdico qu actividades son seguras para usted. Concurra a todas las visitas de seguimiento como se lo haya indicado el mdico. Esto es importante. Comunquese con un mdico si: Tiene una cefalea migraosa que es diferente o peor que otras que ha tenido. Tiene ms de [address] de cefalea por mes. Solicite ayuda inmediatamente si: La cefalea migraosa [employer]. La cefalea migraosa dura ms de 72 horas. Tiene fiebre. Presenta rigidez en el cuello. Tiene dificultad para ver. Siente debilidad en los msculos o que no puede controlarlos. Comienza a perder el equilibrio continuamente. Comienza a tener dificultad para caminar. Pierde el conocimiento (se desmaya). Tiene una convulsin. Resumen [place] cefalea migraosa es un dolor muy intenso y punzante en uno o ambos lados de la cabeza. Estos dolores de [place] tambin pueden causar otros sntomas. Esta afeccin puede tratarse con medicamentos y cambios en el estilo de vida. Lleve un registro diario para Neurosurgeon lo que Financial trader. Comunquese con un mdico si tiene una cefalea migraosa que es diferente o peor que otras que ha tenido. Comunquese con el mdico si tiene ms de 15 das de cefalea en un mes. Esta informacin no tiene Marine scientist el consejo del mdico. Asegrese de hacerle al mdico cualquier pregunta que tenga. Document Revised: 12/26/2018 Document Reviewed: 12/26/2018 Elsevier Patient Education  [place].

## 2021-07-21 NOTE — Progress Notes (Signed)
No history of migraines  Headaches come on with pregnancy  History:  Hailey Walsh is a 40 y.o. [id number] who presents to clinic today for headache eval.  She began having them 3 months ago.  She is 6 months pregnant now.    Another headache starts at the top and moves to the right temple and parietal area.  It can be moderate to severe with throbbing.   Movement, lights and noises all are not good when she has a headache.  No nausea or vomiting.  It begins with inflammation at the back of the head. Rest and tylenol is her current and ineffective treatment strategy. Her mom and one sibling have migraines.  Number of days in the last 4 weeks with:  Severe headache: 3 Moderate headache: 0 Mild headache: 3  No headache: 22   Past Medical History:  Diagnosis Date   Anxiety    patient denies anxiety   Depression    no meds currently   DVT (deep venous thrombosis) ([place]) 11/29/2014   right lower leg   Incompetent cervix 10/29/2009    Social History   Socioeconomic History   Marital status: Single    Spouse name: Not on file   Number of children: Not on file   Years of education: Not on file   Highest education level: Not on file  Occupational History   Not on file  Tobacco Use   Smoking status: Former    Types: Cigarettes    Quit date: 02/26/2021    Years since quitting: 0.3   Smokeless tobacco: Never   Tobacco comments:    Intermittent smoker-occasional  Vaping Use   Vaping Use: Never used  Substance and Sexual Activity   Alcohol use: Not Currently    Comment: special events only   Drug use: No   Sexual activity: Yes    Birth control/protection: Condom  Other Topics Concern   Not on file  Social History Narrative   Not on file   Social Determinants of Health   Financial Resource Strain: Not on file  Food Insecurity: Food Insecurity Present   Worried About [place] in the Last Year: Sometimes true   Ran Out of Food in the Last Year: Sometimes true   Transportation Needs: No Transportation Needs   Lack of Transportation (Medical): No   Lack of Transportation (Non-Medical): No  Physical Activity: Not on file  Stress: Not on file  Social Connections: Not on file  Intimate Partner Violence: Not on file    Family History  Problem Relation Age of Onset   Cancer Mother    Hypertension Mother    Colon cancer Mother    Diabetes Brother    Uterine cancer Sister     No Known Allergies  Current Outpatient Medications on File Prior to Visit  Medication Sig Dispense Refill   aspirin EC 81 MG tablet Take 1 tablet (81 mg total) by mouth daily. Take after 12 weeks for prevention of preeclampsia later in pregnancy 300 tablet 2   Prenatal Vit-Fe Fumarate-FA (PRENATAL VITAMINS PO) Take 1 tablet by mouth daily.     acetaminophen (TYLENOL) 325 MG tablet Take 2 tablets (650 mg total) by mouth every 4 (four) hours as needed (for pain scale < 4). 30 tablet 0   Albuterol Sulfate (PROAIR RESPICLICK) 656 (90 Base) MCG/ACT AEPB INHALAR 2 INHALACION IN LOS PULMONES CADA 6 HORAS SEGUN SEA NECESARIO (Patient not taking: No sig reported) 1 each 4   cetirizine (  ZYRTEC) 10 MG tablet TOME 1 PASTILLA POR VIA ORAL A LA HORA DE ACOSTARSE SEGUN SEA NECESARIO PARA CONGESTION NASAL (Patient not taking: No sig reported) 30 tablet 3   docusate sodium (COLACE) 100 MG capsule Take 1 capsule (100 mg total) by mouth 2 (two) times daily. 10 capsule 0   fluticasone (FLONASE) 50 MCG/ACT nasal spray COLOCAR 2 SPRAYS EN CADA FOSA NASAL (Patient not taking: No sig reported) 16 g 6   polyethylene glycol (MIRALAX) 17 g packet Take 17 g by mouth daily. (Patient not taking: Reported on 07/13/2021) 14 each 0   No current facility-administered medications on file prior to visit.     Review of Systems:  All pertinent positive/negative included in HPI, all other review of systems are negative   Objective:  Physical Exam BP 121/82   Pulse 80   Wt 181 lb (82.1 kg)   LMP 01/23/2021  (Approximate)   BMI 36.49 kg/m  CONSTITUTIONAL: Well-developed, well-nourished female in no acute distress.  EYES: EOM intact ENT: Normocephalic CARDIOVASCULAR: Regular rate  RESPIRATORY: Normal rate. MUSCULOSKELETAL: Normal ROM SKIN: Warm, dry without erythema  NEUROLOGICAL: Alert, oriented, CN II-XII grossly intact, Appropriate balance PSYCH: Normal behavior, mood   Assessment & Plan:  Assessment: 1. Migraine without aura and without status migrainosus, not intractable   2. Pregnancy headache in second trimester      Plan: Use ice on shoulders/base of skull liberally Fioricet for acute headache May use 1/2-1 tab of flexeril for acute headache.  Sedation precautions discussed Follow-up PRN  Entire visit was aided by in-person interpreter from [facility]. Paticia Stack, PA-C 07/21/2021 10:01 AM

## 2021-08-10 ENCOUNTER — Other Ambulatory Visit: Payer: Self-pay | Admitting: *Deleted

## 2021-08-10 ENCOUNTER — Ambulatory Visit (INDEPENDENT_AMBULATORY_CARE_PROVIDER_SITE_OTHER): Payer: Self-pay | Admitting: Certified Nurse Midwife

## 2021-08-10 ENCOUNTER — Other Ambulatory Visit: Payer: Self-pay

## 2021-08-10 ENCOUNTER — Ambulatory Visit: Payer: Self-pay | Admitting: *Deleted

## 2021-08-10 ENCOUNTER — Ambulatory Visit: Payer: Self-pay | Attending: Maternal & Fetal Medicine

## 2021-08-10 ENCOUNTER — Other Ambulatory Visit (HOSPITAL_COMMUNITY)
Admission: RE | Admit: 2021-08-10 | Discharge: 2021-08-10 | Disposition: A | Discharge status: Home / Self Care | Payer: Self-pay | Source: Ambulatory Visit | Attending: Certified Nurse Midwife | Admitting: Certified Nurse Midwife

## 2021-08-10 VITALS — BP 119/70 | HR 71

## 2021-08-10 VITALS — BP 123/72 | HR 77

## 2021-08-10 DIAGNOSIS — O0990 Supervision of high risk pregnancy, unspecified, unspecified trimester: Secondary | ICD-10-CM

## 2021-08-10 DIAGNOSIS — Z86718 Personal history of other venous thrombosis and embolism: Secondary | ICD-10-CM

## 2021-08-10 DIAGNOSIS — O3432 Maternal care for cervical incompetence, second trimester: Secondary | ICD-10-CM

## 2021-08-10 DIAGNOSIS — O3430 Maternal care for cervical incompetence, unspecified trimester: Secondary | ICD-10-CM

## 2021-08-10 DIAGNOSIS — O09292 Supervision of pregnancy with other poor reproductive or obstetric history, second trimester: Secondary | ICD-10-CM

## 2021-08-10 DIAGNOSIS — Z3A27 27 weeks gestation of pregnancy: Secondary | ICD-10-CM

## 2021-08-10 DIAGNOSIS — O09522 Supervision of elderly multigravida, second trimester: Secondary | ICD-10-CM

## 2021-08-10 DIAGNOSIS — B3731 Acute candidiasis of vulva and vagina: Secondary | ICD-10-CM

## 2021-08-10 DIAGNOSIS — Z23 Encounter for immunization: Secondary | ICD-10-CM

## 2021-08-10 DIAGNOSIS — R638 Other symptoms and signs concerning food and fluid intake: Secondary | ICD-10-CM | POA: Insufficient documentation

## 2021-08-10 DIAGNOSIS — N898 Other specified noninflammatory disorders of vagina: Secondary | ICD-10-CM

## 2021-08-10 NOTE — Progress Notes (Signed)
Patient states for the last 8 days she has had vaginal irritation and green discharge. Will complete swabs today. States she is experiencing pain if she walks a lot, issues with feet swelling.   Altha Harm, CMA

## 2021-08-11 ENCOUNTER — Telehealth: Payer: Self-pay

## 2021-08-11 ENCOUNTER — Other Ambulatory Visit: Payer: Self-pay

## 2021-08-11 ENCOUNTER — Other Ambulatory Visit: Payer: Self-pay | Admitting: Certified Nurse Midwife

## 2021-08-11 ENCOUNTER — Encounter: Payer: Self-pay | Admitting: Family

## 2021-08-11 DIAGNOSIS — O24410 Gestational diabetes mellitus in pregnancy, diet controlled: Secondary | ICD-10-CM

## 2021-08-11 LAB — CERVICOVAGINAL ANCILLARY ONLY
Bacterial Vaginitis (gardnerella): NEGATIVE
Candida Glabrata: POSITIVE — AB
Candida Vaginitis: NEGATIVE
Chlamydia: NEGATIVE
Comment: NEGATIVE
Comment: NEGATIVE
Comment: NEGATIVE
Comment: NEGATIVE
Comment: NEGATIVE
Comment: NORMAL
Neisseria Gonorrhea: NEGATIVE
Trichomonas: NEGATIVE

## 2021-08-11 LAB — CBC
Hematocrit: 35.1 % (ref 34.0–46.6)
Hemoglobin: 12.3 g/dL (ref 11.1–15.9)
MCH: 31.9 pg (ref 26.6–33.0)
MCHC: 35 g/dL (ref 31.5–35.7)
MCV: 91 fL (ref 79–97)
Platelets: 181 10*3/uL (ref 150–450)
RBC: 3.86 x10E6/uL (ref 3.77–5.28)
RDW: 11.8 % (ref 11.7–15.4)
WBC: 7.8 10*3/uL (ref 3.4–10.8)

## 2021-08-11 LAB — GLUCOSE TOLERANCE, 2 HOURS W/ 1HR
Glucose, 1 hour: 219 mg/dL — ABNORMAL HIGH (ref 70–179)
Glucose, 2 hour: 109 mg/dL (ref 70–152)
Glucose, Fasting: 105 mg/dL — ABNORMAL HIGH (ref 70–91)

## 2021-08-11 LAB — HIV ANTIBODY (ROUTINE TESTING W REFLEX): HIV Screen 4th Generation wRfx: NONREACTIVE

## 2021-08-11 LAB — RPR: RPR Ser Ql: NONREACTIVE

## 2021-08-11 MED ORDER — TRUE METRIX BLOOD GLUCOSE TEST VI STRP
ORAL_STRIP | 12 refills | Status: DC
  Filled 2021-08-11: qty 100, 25d supply, fill #0

## 2021-08-11 MED ORDER — TRUEPLUS LANCETS 28G MISC
1.0000 | Freq: Four times a day (QID) | 12 refills | Status: DC
  Filled 2021-08-11: qty 100, 25d supply, fill #0

## 2021-08-11 MED ORDER — TRUE METRIX METER W/DEVICE KIT
1.0000 | PACK | 0 refills | Status: DC
  Filled 2021-08-11: qty 1, 30d supply, fill #0

## 2021-08-11 NOTE — Telephone Encounter (Signed)
Call placed back to pt with interpreter Raquel. Spoke with pt. Pt given results and recommendations per Gaylan Gerold, CNM. Pt does not have insurance. Pt advised to come to office on 10/17 and get supplies and will be educated on how to take her BG. Pt verbalized understanding and agreeable to plan of care. Has Diabetes appt on 10/26 at 815am . Pt agreeable to date and time of appt.   Colletta [place], RN

## 2021-08-11 NOTE — Telephone Encounter (Signed)
-----   Message from Gabriel Carina, [place] sent at 08/11/2021  7:21 AM EDT ----- Please call patient to inform her of results, I am sending info in Spanish on GDM and ordering her supplies. Thank you!

## 2021-08-11 NOTE — Progress Notes (Signed)
   PRENATAL VISIT NOTE  Subjective:  Hailey Walsh is a 40 y.o. [id number] at [redacted]w[redacted]d being seen today for ongoing prenatal care.  She is currently monitored for the following issues for this high-risk pregnancy and has History of DVT of lower extremity; History of pelvic fracture; Cough variant asthma; Pulmonary nodule; Supervision of high risk pregnancy, antepartum; AMA (advanced maternal age) multigravida 35+; Cervical insufficiency during pregnancy, antepartum; History of cervical cerclage; Cervical cerclage suture present; Migraine without aura and without status migrainosus, not intractable; and Pregnancy headache in second trimester on their problem list.  Patient reports green vaginal discharge for the past week plus some lower abdominal cramping (mild) and mild ankle swelling.  Contractions: Not present. Vag. Bleeding: Scant.  Movement: Present. Denies leaking of fluid.   The following portions of the patient's history were reviewed and updated as appropriate: allergies, current medications, past family history, past medical history, past social history, past surgical history and problem list.   Objective:   Vitals:   08/10/21 1113  BP: 123/72  Pulse: 77    Fetal Status: Fetal Heart Rate (bpm): 136 Fundal Height: 28 cm Movement: Present     General:  Alert, oriented and cooperative. Patient is in no acute distress.  Skin: Skin is warm and dry. No rash noted.   Cardiovascular: Normal heart rate noted  Respiratory: Normal respiratory effort, no problems with respiration noted  Abdomen: Soft, gravid, appropriate for gestational age.  Pain/Pressure: Present     Pelvic: Cervical exam deferred        Extremities: Normal range of motion.  Edema: Trace  Mental Status: Normal mood and affect. Normal behavior. Normal judgment and thought content.   Assessment and Plan:  Pregnancy: [id number] at [redacted]w[redacted]d 1. Supervision of high risk pregnancy, antepartum - Doing well, feeling regular and  vigorous fetal movement  - Tdap vaccine greater than or equal to 7yo IM  2. [redacted] weeks gestation of pregnancy - Routine OB care including 3rd trimester testing/labs  3. Vaginal discharge - Cervicovaginal ancillary only( [place])  Preterm labor symptoms and general obstetric precautions including but not limited to vaginal bleeding, contractions, leaking of fluid and fetal movement were reviewed in detail with the patient. Please refer to After Visit Summary for other counseling recommendations.   Return in about 2 weeks (around 08/24/2021) for IN-PERSON, [place].  Future Appointments  Date Time Provider [place]  08/25/2021 10:15 AM Griffin Basil, MD [facility]  09/07/2021  3:45 PM [facility] NURSE [facility]  09/07/2021  4:00 PM [facility] US1 [facility] [place]    Gabriel Carina, CNM

## 2021-08-14 ENCOUNTER — Other Ambulatory Visit: Payer: Self-pay

## 2021-08-14 MED ORDER — MICONAZOLE NITRATE 2 % VA CREA
1.0000 | TOPICAL_CREAM | Freq: Every day | VAGINAL | 2 refills | Status: DC
  Filled 2021-08-14: qty 30, fill #0

## 2021-08-14 NOTE — Addendum Note (Signed)
Addended by: Gaylan Gerold on: 08/14/2021 08:51 AM   Modules accepted: Orders

## 2021-08-22 ENCOUNTER — Other Ambulatory Visit: Payer: Self-pay

## 2021-08-22 ENCOUNTER — Ambulatory Visit: Payer: Self-pay | Admitting: Registered"

## 2021-08-22 ENCOUNTER — Encounter: Payer: Self-pay | Attending: Certified Nurse Midwife | Admitting: Registered"

## 2021-08-22 DIAGNOSIS — Z3A00 Weeks of gestation of pregnancy not specified: Secondary | ICD-10-CM | POA: Insufficient documentation

## 2021-08-22 DIAGNOSIS — O24419 Gestational diabetes mellitus in pregnancy, unspecified control: Secondary | ICD-10-CM | POA: Insufficient documentation

## 2021-08-22 DIAGNOSIS — O24410 Gestational diabetes mellitus in pregnancy, diet controlled: Secondary | ICD-10-CM | POA: Insufficient documentation

## 2021-08-22 NOTE — Progress Notes (Signed)
In-person interpreter Raquel from [facility]  Patient was seen for Gestational Diabetes self-management on 08/22/2021  Start time 9:15 and End time 10:15    Estimated due date: 11/07/2021; [redacted]w[redacted]d  Clinical: Medications: fioricet, zyrtec, flexeril, prenatal vitamins Medical History: reviewed Labs: OGTT 105-219-109, A1c 5.5%   Dietary and Lifestyle History: Pt reports after last MD visit she stopped drinking soda. Pt states yesterday she had a lot of stress with her 2 children arguing. Pt states she also has stress because she feels like her children are always eating and thinks they are eating too much. Pt states the pediatrician told her it was okay that they are wanting to eat and that their weight and height are normal for their age.  Patient states she drinks Herbalife products because she enjoys them, she is not trying to lose weight.  Physical Activity:ADL Stress: high Sleep: not assessed  Typical diet Recall: First Meal: Herbalife shake & tea OR  strawberries blended with 1% milk and ham sandwich on whole wheat bread. Snack: cream filled cake snack (30 g cho) Second meal: rice ~1.5 c, eggs (176 mg/dL) Snack: carrots, yogurt, fruit Third meal: pork, sauce, beans Snack: yogurt, banana Beverages: water  NUTRITION INTERVENTION  Nutrition education (E-1) on the following topics:   Initial Follow-up  [x]  []  Definition of Gestational Diabetes []  []  Why dietary management is important in controlling blood glucose [x]  []  Effects each nutrient has on blood glucose levels []  []  Simple carbohydrates vs complex carbohydrates []  []  Fluid intake [x]  []  Creating a balanced meal plan [x]  []  Carbohydrate counting  [x]  []  When to check blood glucose levels [x]  []  Proper blood glucose monitoring techniques [x]  []  Effect of stress and stress reduction techniques  [x]  []  Exercise effect on blood glucose levels, appropriate exercise during pregnancy []  []  Importance of limiting caffeine  and abstaining from alcohol and smoking []  []  Medications used for blood sugar control during pregnancy []  []  Hypoglycemia and rule of 15 []  []  Postpartum self care  Patient already has a meter, is testing pre breakfast and 2 hours after each meal. Patient was testing 2 hrs from time of finishing meal. FBS: 102-123 mg/dL Postprandial: 99-176 mg/dL (most are WNL)    Patient instructed to start exercising in the evening and draw a line on her log to indicate when she started to help MD on Friday know if and how much medication is needed.   Patient instructed to monitor glucose levels: FBS: 60 - ? 95 mg/dL (some clinics use 90 for cutoff) 1 hour: ? 140 mg/dL 2 hour: ? 120 mg/dL  Patient received handouts: Nutrition Diabetes and Pregnancy Carbohydrate Counting List Planning Healthy Meals (tri-fold)  Patient will be seen for follow-up as needed.

## 2021-08-23 ENCOUNTER — Other Ambulatory Visit: Payer: Self-pay

## 2021-08-25 ENCOUNTER — Other Ambulatory Visit: Payer: Self-pay

## 2021-08-25 ENCOUNTER — Ambulatory Visit (INDEPENDENT_AMBULATORY_CARE_PROVIDER_SITE_OTHER): Payer: Self-pay | Admitting: Obstetrics and Gynecology

## 2021-08-25 VITALS — BP 123/75 | HR 81 | Wt 186.2 lb

## 2021-08-25 DIAGNOSIS — O24415 Gestational diabetes mellitus in pregnancy, controlled by oral hypoglycemic drugs: Secondary | ICD-10-CM

## 2021-08-25 DIAGNOSIS — Z86718 Personal history of other venous thrombosis and embolism: Secondary | ICD-10-CM

## 2021-08-25 DIAGNOSIS — Z3A29 29 weeks gestation of pregnancy: Secondary | ICD-10-CM

## 2021-08-25 DIAGNOSIS — O0990 Supervision of high risk pregnancy, unspecified, unspecified trimester: Secondary | ICD-10-CM

## 2021-08-25 DIAGNOSIS — O3433 Maternal care for cervical incompetence, third trimester: Secondary | ICD-10-CM

## 2021-08-25 DIAGNOSIS — Z5941 Food insecurity: Secondary | ICD-10-CM | POA: Insufficient documentation

## 2021-08-25 DIAGNOSIS — O3430 Maternal care for cervical incompetence, unspecified trimester: Secondary | ICD-10-CM

## 2021-08-25 DIAGNOSIS — O09523 Supervision of elderly multigravida, third trimester: Secondary | ICD-10-CM

## 2021-08-25 MED ORDER — METFORMIN HCL 500 MG PO TABS
500.0000 mg | ORAL_TABLET | Freq: Every day | ORAL | 2 refills | Status: DC
Start: 2021-08-25 — End: 2021-09-08
  Filled 2021-08-25: qty 30, 30d supply, fill #0

## 2021-08-25 NOTE — Progress Notes (Signed)
Patient stated vaginal pressure that started 2 days ago along with headaches and swelling of both feet but more in right foot.  Miralrio also complains of lower back pain that will "go down to left left and cause it to hurt"

## 2021-08-25 NOTE — Progress Notes (Signed)
   PRENATAL VISIT NOTE  Subjective:  Hailey Walsh is a 40 y.o. [id number] at [redacted]w[redacted]d being seen today for ongoing prenatal care.  She is currently monitored for the following issues for this high-risk pregnancy and has History of DVT of lower extremity; History of pelvic fracture; Cough variant asthma; Pulmonary nodule; Supervision of high risk pregnancy, antepartum; AMA (advanced maternal age) multigravida 35+; Cervical insufficiency during pregnancy, antepartum; History of cervical cerclage; Cervical cerclage suture present; Migraine without aura and without status migrainosus, not intractable; Pregnancy headache in second trimester; Gestational diabetes mellitus (GDM) in third trimester; [redacted] weeks gestation of pregnancy; and Food insecurity on their problem list.  Patient doing well with no acute concerns today. She reports backache and headache.  Contractions: Not present. Vag. Bleeding: None.  Movement: Present. Denies leaking of fluid.   The following portions of the patient's history were reviewed and updated as appropriate: allergies, current medications, past family history, past medical history, past social history, past surgical history and problem list. Problem list updated.  Objective:   Vitals:   08/25/21 1018  BP: 123/75  Pulse: 81  Weight: 186 lb 3.2 oz (84.5 kg)    Fetal Status: Fetal Heart Rate (bpm): 144 Fundal Height: 28 cm Movement: Present     General:  Alert, oriented and cooperative. Patient is in no acute distress.  Skin: Skin is warm and dry. No rash noted.   Cardiovascular: Normal heart rate noted  Respiratory: Normal respiratory effort, no problems with respiration noted  Abdomen: Soft, gravid, appropriate for gestational age.  Pain/Pressure: Present     Pelvic: Cervical exam deferred        Extremities: Normal range of motion.  Edema: Trace  Mental Status:  Normal mood and affect. Normal behavior. Normal judgment and thought content.   Assessment and  Plan:  Pregnancy: [id number] at [redacted]w[redacted]d  1. [redacted] weeks gestation of pregnancy   2. Cervical insufficiency during pregnancy, antepartum No s/x of PTL  3. Cervical cerclage suture present in third trimester   4. Supervision of high risk pregnancy, antepartum Continue routine care  5. History of DVT of lower extremity S/p fracture, no need for prophylaxis  6. Multigravida of advanced maternal age in third trimester   7. Food insecurity Pt to food bank - AMBULATORY REFERRAL TO [place] [facility]  8. Gestational diabetes mellitus (GDM) in third trimester controlled on oral hypoglycemic drug FBS: 102-118 PPBS: 99-146  2 outliers 176 and 194, most within range  Evening metformin added, pt may need BID dosing  - metFORMIN (GLUCOPHAGE) 500 MG tablet; Take 1 tablet (500 mg total) by mouth daily with supper.  Dispense: 30 tablet; Refill: 2  Preterm labor symptoms and general obstetric precautions including but not limited to vaginal bleeding, contractions, leaking of fluid and fetal movement were reviewed in detail with the patient.  Please refer to After Visit Summary for other counseling recommendations.   Return today (on 08/25/2021) for [facility], in person.   Lynnda Shields, MD Faculty Attending Center for [facility]

## 2021-08-25 NOTE — Progress Notes (Deleted)
   PRENATAL VISIT NOTE  Subjective:  Hailey Walsh is a 40 y.o. [id number] at [redacted]w[redacted]d being seen today for ongoing prenatal care.  She is currently monitored for the following issues for this high-risk pregnancy and has History of DVT of lower extremity; History of pelvic fracture; Cough variant asthma; Pulmonary nodule; Supervision of high risk pregnancy, antepartum; AMA (advanced maternal age) multigravida 35+; Cervical insufficiency during pregnancy, antepartum; History of cervical cerclage; Cervical cerclage suture present; Migraine without aura and without status migrainosus, not intractable; Pregnancy headache in second trimester; Gestational diabetes mellitus (GDM) in third trimester; [redacted] weeks gestation of pregnancy; and Food insecurity on their problem list.  Patient doing well with no acute concerns today. She reports backache and headache.  Headache improved with tylenol.  Back ache consistent with sciatica. Contractions: Not present. Vag. Bleeding: None.  Movement: Present. Denies leaking of fluid.   The following portions of the patient's history were reviewed and updated as appropriate: allergies, current medications, past family history, past medical history, past social history, past surgical history and problem list. Problem list updated.  Objective:   Vitals:   08/25/21 1018  BP: 123/75  Pulse: 81  Weight: 186 lb 3.2 oz (84.5 kg)    Fetal Status: Fetal Heart Rate (bpm): 144 Fundal Height: 28 cm Movement: Present     General:  Alert, oriented and cooperative. Patient is in no acute distress.  Skin: Skin is warm and dry. No rash noted.   Cardiovascular: Normal heart rate noted  Respiratory: Normal respiratory effort, no problems with respiration noted  Abdomen: Soft, gravid, appropriate for gestational age.  Pain/Pressure: Present     Pelvic: Cervical exam deferred        Extremities: Normal range of motion.  Edema: Trace  Mental Status:  Normal mood and affect. Normal  behavior. Normal judgment and thought content.   Assessment and Plan:  Pregnancy: [id number] at [redacted]w[redacted]d  1. [redacted] weeks gestation of pregnancy   2. Cervical insufficiency during pregnancy, antepartum Cerclage in place, no s/sx of PTL  3. Cervical cerclage suture present in third trimester   4. Supervision of high risk pregnancy, antepartum Continue routine care  5. History of DVT of lower extremity Seen after fracture, no prophylaxis  6. Multigravida of advanced maternal age in third trimester   7. Food insecurity Pt to food bank - AMBULATORY REFERRAL TO [place] [facility]  8. Gestational diabetes mellitus (GDM) in third trimester controlled on oral hypoglycemic drug FBS:  all fastings elevated PPBS:  most within range  Will add metformin to dinner and recheck  fastings, may need BID dosing  - metFORMIN (GLUCOPHAGE) 500 MG tablet; Take 1 tablet (500 mg total) by mouth daily with supper.  Dispense: 30 tablet; Refill: 2  Preterm labor symptoms and general obstetric precautions including but not limited to vaginal bleeding, contractions, leaking of fluid and fetal movement were reviewed in detail with the patient.  Please refer to After Visit Summary for other counseling recommendations.   Return today (on 08/25/2021) for [facility], in person.   Lynnda Shields, MD Faculty Attending Center for [facility]

## 2021-08-31 ENCOUNTER — Other Ambulatory Visit: Payer: Self-pay

## 2021-09-07 ENCOUNTER — Other Ambulatory Visit: Payer: Self-pay

## 2021-09-07 ENCOUNTER — Ambulatory Visit: Payer: Self-pay | Attending: Obstetrics

## 2021-09-07 ENCOUNTER — Ambulatory Visit: Payer: Self-pay | Admitting: *Deleted

## 2021-09-07 VITALS — BP 112/78 | HR 96

## 2021-09-07 DIAGNOSIS — O0990 Supervision of high risk pregnancy, unspecified, unspecified trimester: Secondary | ICD-10-CM

## 2021-09-07 DIAGNOSIS — O3432 Maternal care for cervical incompetence, second trimester: Secondary | ICD-10-CM | POA: Insufficient documentation

## 2021-09-07 DIAGNOSIS — O09523 Supervision of elderly multigravida, third trimester: Secondary | ICD-10-CM | POA: Insufficient documentation

## 2021-09-07 DIAGNOSIS — E669 Obesity, unspecified: Secondary | ICD-10-CM

## 2021-09-07 DIAGNOSIS — O99213 Obesity complicating pregnancy, third trimester: Secondary | ICD-10-CM

## 2021-09-07 DIAGNOSIS — Z86718 Personal history of other venous thrombosis and embolism: Secondary | ICD-10-CM | POA: Insufficient documentation

## 2021-09-07 DIAGNOSIS — O09522 Supervision of elderly multigravida, second trimester: Secondary | ICD-10-CM | POA: Insufficient documentation

## 2021-09-07 DIAGNOSIS — O3433 Maternal care for cervical incompetence, third trimester: Secondary | ICD-10-CM

## 2021-09-07 DIAGNOSIS — O3430 Maternal care for cervical incompetence, unspecified trimester: Secondary | ICD-10-CM

## 2021-09-07 DIAGNOSIS — Z3A31 31 weeks gestation of pregnancy: Secondary | ICD-10-CM

## 2021-09-08 ENCOUNTER — Ambulatory Visit (INDEPENDENT_AMBULATORY_CARE_PROVIDER_SITE_OTHER): Payer: Self-pay | Admitting: Obstetrics & Gynecology

## 2021-09-08 ENCOUNTER — Other Ambulatory Visit: Payer: Self-pay | Admitting: *Deleted

## 2021-09-08 ENCOUNTER — Other Ambulatory Visit: Payer: Self-pay

## 2021-09-08 VITALS — BP 115/84 | HR 93 | Wt 188.1 lb

## 2021-09-08 DIAGNOSIS — O09523 Supervision of elderly multigravida, third trimester: Secondary | ICD-10-CM

## 2021-09-08 DIAGNOSIS — O24419 Gestational diabetes mellitus in pregnancy, unspecified control: Secondary | ICD-10-CM

## 2021-09-08 DIAGNOSIS — O3433 Maternal care for cervical incompetence, third trimester: Secondary | ICD-10-CM

## 2021-09-08 DIAGNOSIS — O24415 Gestational diabetes mellitus in pregnancy, controlled by oral hypoglycemic drugs: Secondary | ICD-10-CM

## 2021-09-08 DIAGNOSIS — O24410 Gestational diabetes mellitus in pregnancy, diet controlled: Secondary | ICD-10-CM

## 2021-09-08 DIAGNOSIS — O0990 Supervision of high risk pregnancy, unspecified, unspecified trimester: Secondary | ICD-10-CM

## 2021-09-08 MED ORDER — METFORMIN HCL 500 MG PO TABS
1000.0000 mg | ORAL_TABLET | Freq: Every day | ORAL | 2 refills | Status: DC
  Filled 2021-09-08 – 2021-09-20 (×2): qty 60, 30d supply, fill #0

## 2021-09-08 NOTE — Progress Notes (Signed)
Having leg cramping during the night/states drinks a lot of water & pain in lower abdomen & upper stomach.

## 2021-09-08 NOTE — Progress Notes (Signed)
   PRENATAL VISIT NOTE  Subjective:  Hailey Walsh is a 40 y.o. [id number] at [redacted]w[redacted]d being seen today for ongoing prenatal care.  She is currently monitored for the following issues for this high-risk pregnancy and has History of DVT of lower extremity; History of pelvic fracture; Cough variant asthma; Pulmonary nodule; Supervision of high risk pregnancy, antepartum; AMA (advanced maternal age) multigravida 35+; Cervical insufficiency during pregnancy, antepartum; History of cervical cerclage; Cervical cerclage suture present; Migraine without aura and without status migrainosus, not intractable; Pregnancy headache in second trimester; Gestational diabetes mellitus (GDM) in third trimester; [redacted] weeks gestation of pregnancy; and Food insecurity on their problem list.  Patient reports occasional contractions and pressure , cramps, LUQ pains .  Contractions: Irritability. Vag. Bleeding: None.  Movement: Present. Denies leaking of fluid.   The following portions of the patient's history were reviewed and updated as appropriate: allergies, current medications, past family history, past medical history, past social history, past surgical history and problem list.   Objective:   Vitals:   09/08/21 0826  BP: 115/84  Pulse: 93  Weight: 188 lb 1.6 oz (85.3 kg)    Fetal Status: Fetal Heart Rate (bpm): 140   Movement: Present     General:  Alert, oriented and cooperative. Patient is in no acute distress.  Skin: Skin is warm and dry. No rash noted.   Cardiovascular: Normal heart rate noted  Respiratory: Normal respiratory effort, no problems with respiration noted  Abdomen: Soft, gravid, appropriate for gestational age.  Pain/Pressure: Present     Pelvic: Cervical exam deferred        Extremities: Normal range of motion.  Edema: Trace  Mental Status: Normal mood and affect. Normal behavior. Normal judgment and thought content.   Assessment and Plan:  Pregnancy: [id number] at [redacted]w[redacted]d 1. Diet  controlled gestational diabetes mellitus (GDM) in third trimester FBS 80% 100-110, PP 40% > 120, increase metformin  2. Cervical cerclage suture present in third trimester   3. Supervision of high risk pregnancy, antepartum   4. Gestational diabetes mellitus (GDM) in third trimester controlled on oral hypoglycemic drug Weekly BPP next week  Preterm labor symptoms and general obstetric precautions including but not limited to vaginal bleeding, contractions, leaking of fluid and fetal movement were reviewed in detail with the patient. Please refer to After Visit Summary for other counseling recommendations.   Return in about 1 week (around 09/15/2021).  Future Appointments  Date Time Provider [place]  09/15/2021  3:30 PM [facility] NURSE [facility]  09/15/2021  3:45 PM [facility] US6 [facility]  09/20/2021  2:00 PM [facility] NURSE [facility]  09/20/2021  2:15 PM [facility] NST [facility]  09/28/2021  3:15 PM [facility] NST [facility]  10/05/2021  8:00 AM [facility] NURSE [facility]  10/05/2021  8:15 AM [facility] US2 [facility]    Emeterio Reeve, MD

## 2021-09-08 NOTE — Progress Notes (Unsigned)
Us/

## 2021-09-15 ENCOUNTER — Other Ambulatory Visit: Payer: Self-pay

## 2021-09-15 ENCOUNTER — Ambulatory Visit: Payer: Self-pay | Admitting: *Deleted

## 2021-09-15 ENCOUNTER — Encounter: Payer: Self-pay | Admitting: *Deleted

## 2021-09-15 ENCOUNTER — Ambulatory Visit: Payer: Self-pay | Attending: Obstetrics and Gynecology

## 2021-09-15 VITALS — BP 122/77 | HR 81

## 2021-09-15 DIAGNOSIS — O0990 Supervision of high risk pregnancy, unspecified, unspecified trimester: Secondary | ICD-10-CM

## 2021-09-15 DIAGNOSIS — O3430 Maternal care for cervical incompetence, unspecified trimester: Secondary | ICD-10-CM | POA: Insufficient documentation

## 2021-09-15 DIAGNOSIS — O24419 Gestational diabetes mellitus in pregnancy, unspecified control: Secondary | ICD-10-CM | POA: Insufficient documentation

## 2021-09-15 DIAGNOSIS — O3433 Maternal care for cervical incompetence, third trimester: Secondary | ICD-10-CM | POA: Insufficient documentation

## 2021-09-15 DIAGNOSIS — O09523 Supervision of elderly multigravida, third trimester: Secondary | ICD-10-CM | POA: Insufficient documentation

## 2021-09-19 ENCOUNTER — Other Ambulatory Visit: Payer: Self-pay

## 2021-09-19 ENCOUNTER — Encounter: Payer: Self-pay | Admitting: Family Medicine

## 2021-09-20 ENCOUNTER — Ambulatory Visit: Payer: Self-pay | Attending: Obstetrics and Gynecology | Admitting: *Deleted

## 2021-09-20 ENCOUNTER — Other Ambulatory Visit: Payer: Self-pay

## 2021-09-20 ENCOUNTER — Ambulatory Visit (HOSPITAL_BASED_OUTPATIENT_CLINIC_OR_DEPARTMENT_OTHER): Payer: Self-pay | Admitting: *Deleted

## 2021-09-20 ENCOUNTER — Encounter: Payer: Self-pay | Admitting: *Deleted

## 2021-09-20 VITALS — BP 111/72 | HR 76

## 2021-09-20 DIAGNOSIS — O24419 Gestational diabetes mellitus in pregnancy, unspecified control: Secondary | ICD-10-CM

## 2021-09-20 DIAGNOSIS — O99213 Obesity complicating pregnancy, third trimester: Secondary | ICD-10-CM | POA: Insufficient documentation

## 2021-09-20 DIAGNOSIS — O09523 Supervision of elderly multigravida, third trimester: Secondary | ICD-10-CM | POA: Insufficient documentation

## 2021-09-20 DIAGNOSIS — Z6835 Body mass index (BMI) 35.0-35.9, adult: Secondary | ICD-10-CM

## 2021-09-20 DIAGNOSIS — Z3A33 33 weeks gestation of pregnancy: Secondary | ICD-10-CM

## 2021-09-20 DIAGNOSIS — O3430 Maternal care for cervical incompetence, unspecified trimester: Secondary | ICD-10-CM

## 2021-09-20 DIAGNOSIS — O0990 Supervision of high risk pregnancy, unspecified, unspecified trimester: Secondary | ICD-10-CM

## 2021-09-20 NOTE — Procedures (Signed)
Hailey Walsh [birth year] [redacted]w[redacted]d  Fetus A Non-Stress Test Interpretation for 09/20/21  Indication:  AMA, GDM, Obesity  Fetal Heart Rate A Mode: External Baseline Rate (A): 140 bpm Variability: Moderate Accelerations: 15 x 15 Decelerations: None Multiple birth?: No  Uterine Activity Mode: Palpation, Toco Contraction Frequency (min): none Resting Tone Palpated: Relaxed  Interpretation (Fetal Testing) Nonstress Test Interpretation: Reactive Overall Impression: Reassuring for gestational age Comments: Dr. Donalee Citrin reviewed tracing

## 2021-09-28 ENCOUNTER — Other Ambulatory Visit: Payer: Self-pay

## 2021-09-28 ENCOUNTER — Ambulatory Visit (INDEPENDENT_AMBULATORY_CARE_PROVIDER_SITE_OTHER): Payer: Self-pay

## 2021-09-28 ENCOUNTER — Ambulatory Visit: Payer: Self-pay | Admitting: *Deleted

## 2021-09-28 VITALS — BP 98/74 | HR 108

## 2021-09-28 DIAGNOSIS — O24415 Gestational diabetes mellitus in pregnancy, controlled by oral hypoglycemic drugs: Secondary | ICD-10-CM

## 2021-09-28 NOTE — Progress Notes (Signed)

## 2021-10-05 ENCOUNTER — Other Ambulatory Visit: Payer: Self-pay

## 2021-10-05 ENCOUNTER — Ambulatory Visit: Payer: Self-pay | Admitting: *Deleted

## 2021-10-05 ENCOUNTER — Other Ambulatory Visit: Payer: Self-pay | Admitting: Obstetrics and Gynecology

## 2021-10-05 ENCOUNTER — Ambulatory Visit (INDEPENDENT_AMBULATORY_CARE_PROVIDER_SITE_OTHER): Payer: Self-pay | Admitting: Obstetrics and Gynecology

## 2021-10-05 ENCOUNTER — Encounter: Payer: Self-pay | Admitting: *Deleted

## 2021-10-05 ENCOUNTER — Ambulatory Visit: Payer: Self-pay | Attending: Obstetrics and Gynecology

## 2021-10-05 ENCOUNTER — Other Ambulatory Visit (HOSPITAL_COMMUNITY)
Admission: RE | Admit: 2021-10-05 | Discharge: 2021-10-05 | Disposition: A | Discharge status: Home / Self Care | Payer: Self-pay | Source: Ambulatory Visit | Attending: Obstetrics and Gynecology | Admitting: Obstetrics and Gynecology

## 2021-10-05 VITALS — BP 113/74 | HR 83

## 2021-10-05 VITALS — BP 118/82 | HR 83 | Wt 188.3 lb

## 2021-10-05 DIAGNOSIS — O24419 Gestational diabetes mellitus in pregnancy, unspecified control: Secondary | ICD-10-CM | POA: Insufficient documentation

## 2021-10-05 DIAGNOSIS — E669 Obesity, unspecified: Secondary | ICD-10-CM

## 2021-10-05 DIAGNOSIS — O0990 Supervision of high risk pregnancy, unspecified, unspecified trimester: Secondary | ICD-10-CM

## 2021-10-05 DIAGNOSIS — Z3A35 35 weeks gestation of pregnancy: Secondary | ICD-10-CM

## 2021-10-05 DIAGNOSIS — O09523 Supervision of elderly multigravida, third trimester: Secondary | ICD-10-CM | POA: Insufficient documentation

## 2021-10-05 DIAGNOSIS — O09293 Supervision of pregnancy with other poor reproductive or obstetric history, third trimester: Secondary | ICD-10-CM

## 2021-10-05 DIAGNOSIS — O3430 Maternal care for cervical incompetence, unspecified trimester: Secondary | ICD-10-CM | POA: Insufficient documentation

## 2021-10-05 DIAGNOSIS — O3433 Maternal care for cervical incompetence, third trimester: Secondary | ICD-10-CM

## 2021-10-05 DIAGNOSIS — O99213 Obesity complicating pregnancy, third trimester: Secondary | ICD-10-CM

## 2021-10-05 DIAGNOSIS — O24415 Gestational diabetes mellitus in pregnancy, controlled by oral hypoglycemic drugs: Secondary | ICD-10-CM

## 2021-10-05 DIAGNOSIS — Z8781 Personal history of (healed) traumatic fracture: Secondary | ICD-10-CM

## 2021-10-05 LAB — OB RESULTS CONSOLE GC/CHLAMYDIA: Gonorrhea: NEGATIVE

## 2021-10-05 NOTE — Progress Notes (Signed)
   PRENATAL VISIT NOTE  Subjective:  Hailey Walsh is a 40 y.o. [id number] at [redacted]w[redacted]d being seen today for ongoing prenatal care.  She is currently monitored for the following issues for this high-risk pregnancy and has History of DVT of lower extremity; History of pelvic fracture; Cough variant asthma; Pulmonary nodule; Supervision of high risk pregnancy, antepartum; AMA (advanced maternal age) multigravida 35+; Cervical insufficiency during pregnancy, antepartum; History of cervical cerclage; Cervical cerclage suture present; Migraine without aura and without status migrainosus, not intractable; Pregnancy headache in second trimester; Gestational diabetes mellitus (GDM) in third trimester; [redacted] weeks gestation of pregnancy; Food insecurity; and [redacted] weeks gestation of pregnancy on their problem list.  Patient doing well with no acute concerns today. She reports  multiple vague complaints including pelvic pressure, fatigue and pain with walking .  Contractions: Irritability. Vag. Bleeding: None.  Movement: Present. Denies leaking of fluid.   The following portions of the patient's history were reviewed and updated as appropriate: allergies, current medications, past family history, past medical history, past social history, past surgical history and problem list. Problem list updated.  Objective:   Vitals:   10/05/21 0931  BP: 118/82  Pulse: 83  Weight: 188 lb 4.8 oz (85.4 kg)    Fetal Status: Fetal Heart Rate (bpm): 144   Movement: Present     General:  Alert, oriented and cooperative. Patient is in no acute distress.  Skin: Skin is warm and dry. No rash noted.   Cardiovascular: Normal heart rate noted  Respiratory: Normal respiratory effort, no problems with respiration noted  Abdomen: Soft, gravid, appropriate for gestational age.  Pain/Pressure: Absent     Pelvic: Cervical exam performed      SSE:  cerclage visualized, not on tension, cvx visually closed, moderate vaginal dischagre   Extremities: Normal range of motion.     Mental Status:  Normal mood and affect. Normal behavior. Normal judgment and thought content.   Assessment and Plan:  Pregnancy: [id number] at [redacted]w[redacted]d  1. Supervision of high risk pregnancy, antepartum Continue routine care - Strep Gp B NAA - Cervicovaginal ancillary only( [facility])  2. [redacted] weeks gestation of pregnancy   3. Gestational diabetes mellitus (GDM) in third trimester controlled on oral hypoglycemic drug FBS: 87-107 PPBS: 123-141 Poor blood sugar control, increase metformin to 1000 mg po BID  BPP and growth done this AM 4. Multigravida of advanced maternal age in third trimester  5.  Cerclage: remove at 37 weeks   Preterm labor symptoms and general obstetric precautions including but not limited to vaginal bleeding, contractions, leaking of fluid and fetal movement were reviewed in detail with the patient.  Please refer to After Visit Summary for other counseling recommendations.   Return in about 1 week (around 10/12/2021) for [facility], in person.   Lynnda Shields, MD Faculty Attending Center for [facility]

## 2021-10-05 NOTE — Progress Notes (Signed)
Pt states having green vaginal discharge with odor and itching x1 week.  Can do GC/CL and GBS swab today.

## 2021-10-05 NOTE — Patient Instructions (Signed)
Safe Medications in Pregnancy   Insomnia:  Benadryl (alcohol free) 25mg  every 6 hours as needed  Tylenol PM  Unisom, no Gelcaps

## 2021-10-06 LAB — CERVICOVAGINAL ANCILLARY ONLY
Bacterial Vaginitis (gardnerella): NEGATIVE
Candida Glabrata: POSITIVE — AB
Candida Vaginitis: NEGATIVE
Chlamydia: NEGATIVE
Comment: NEGATIVE
Comment: NEGATIVE
Comment: NEGATIVE
Comment: NEGATIVE
Comment: NEGATIVE
Comment: NORMAL
Neisseria Gonorrhea: NEGATIVE
Trichomonas: NEGATIVE

## 2021-10-07 LAB — STREP GP B NAA: Strep Gp B NAA: NEGATIVE

## 2021-10-09 ENCOUNTER — Other Ambulatory Visit: Payer: Self-pay

## 2021-10-09 ENCOUNTER — Telehealth: Payer: Self-pay

## 2021-10-09 MED ORDER — TERCONAZOLE 0.4 % VA CREA
1.0000 | TOPICAL_CREAM | Freq: Every day | VAGINAL | 0 refills | Status: DC
  Filled 2021-10-09: qty 45, 7d supply, fill #0

## 2021-10-09 NOTE — Telephone Encounter (Addendum)
-----   Message from Griffin Basil, MD sent at 10/06/2021  2:00 PM EST ----- Yeast infx noted, offer treatment  Called pt with Spanish Interpreter Raquel M., pt informed pt that she has a yeast infection and we need to send a cream for treatment.  Pt informed Terazol cream sent to her [facility].  Pt verbalized understanding.   Frances Nickels  10/09/21

## 2021-10-10 ENCOUNTER — Other Ambulatory Visit: Payer: Self-pay

## 2021-10-11 ENCOUNTER — Ambulatory Visit (INDEPENDENT_AMBULATORY_CARE_PROVIDER_SITE_OTHER): Payer: Self-pay

## 2021-10-11 ENCOUNTER — Ambulatory Visit (INDEPENDENT_AMBULATORY_CARE_PROVIDER_SITE_OTHER): Payer: Self-pay | Admitting: Family Medicine

## 2021-10-11 ENCOUNTER — Encounter: Payer: Self-pay | Admitting: *Deleted

## 2021-10-11 ENCOUNTER — Other Ambulatory Visit: Payer: Self-pay

## 2021-10-11 VITALS — BP 112/75 | HR 92 | Wt 189.5 lb

## 2021-10-11 DIAGNOSIS — Z8781 Personal history of (healed) traumatic fracture: Secondary | ICD-10-CM

## 2021-10-11 DIAGNOSIS — O0990 Supervision of high risk pregnancy, unspecified, unspecified trimester: Secondary | ICD-10-CM

## 2021-10-11 DIAGNOSIS — O09523 Supervision of elderly multigravida, third trimester: Secondary | ICD-10-CM

## 2021-10-11 DIAGNOSIS — O24415 Gestational diabetes mellitus in pregnancy, controlled by oral hypoglycemic drugs: Secondary | ICD-10-CM

## 2021-10-11 DIAGNOSIS — Z86718 Personal history of other venous thrombosis and embolism: Secondary | ICD-10-CM

## 2021-10-11 DIAGNOSIS — O3433 Maternal care for cervical incompetence, third trimester: Secondary | ICD-10-CM

## 2021-10-11 NOTE — Progress Notes (Signed)

## 2021-10-11 NOTE — Progress Notes (Signed)
° °  Subjective:  Hailey Walsh is a 40 y.o. [id number] at [redacted]w[redacted]d being seen today for ongoing prenatal care.  She is currently monitored for the following issues for this high-risk pregnancy and has History of DVT of lower extremity; History of pelvic fracture; Cough variant asthma; Pulmonary nodule; Supervision of high risk pregnancy, antepartum; AMA (advanced maternal age) multigravida 35+; Cervical insufficiency during pregnancy, antepartum; History of cervical cerclage; Cervical cerclage suture present; Migraine without aura and without status migrainosus, not intractable; Pregnancy headache in second trimester; Gestational diabetes mellitus (GDM) in third trimester; [redacted] weeks gestation of pregnancy; Food insecurity; and [redacted] weeks gestation of pregnancy on their problem list.  Patient reports no complaints.   . Vag. Bleeding: None.  Movement: Present. Denies leaking of fluid.   The following portions of the patient's history were reviewed and updated as appropriate: allergies, current medications, past family history, past medical history, past social history, past surgical history and problem list. Problem list updated.  Objective:   Vitals:   10/11/21 1005  BP: 112/75  Pulse: 92  Weight: 189 lb 8 oz (86 kg)    Fetal Status: Fetal Heart Rate (bpm): RNST   Movement: Present  Presentation: Vertex  General:  Alert, oriented and cooperative. Patient is in no acute distress.  Skin: Skin is warm and dry. No rash noted.   Cardiovascular: Normal heart rate noted  Respiratory: Normal respiratory effort, no problems with respiration noted  Abdomen: Soft, gravid, appropriate for gestational age. Pain/Pressure: Absent     Pelvic: Vag. Bleeding: None     Cervical exam deferred        Extremities: Normal range of motion.     Mental Status: Normal mood and affect. Normal behavior. Normal judgment and thought content.   Urinalysis:      Assessment and Plan:  Pregnancy: [id number] at [redacted]w[redacted]d  1.  Gestational diabetes mellitus (GDM) in third trimester controlled on oral hypoglycemic drug On metformin 1g BID Did not bring log But from recall reports fastings are consistently in 96-low 100's  Post prandials are 120-130's Unclear with what frequency this is occurring however, and given late gestational age and mild excursions I am hesitant to start insulin Strongly emphasized she needs to bring log to next visit so that we can get proper assessment and decide if treatment needs to change/delivery needs to be moved up Can still plan for 39wk delivery at this point, though 37-38wk looking more likely BPP 10/10 Last growth [place] 10/05/21, 59% with EFW 2735g, AC 97% - US FETAL BPP W/NONSTRESS; Future  2. Supervision of high risk pregnancy, antepartum BP and FHR normal  3. Cervical cerclage suture present in third trimester Plan for removal next week  4. History of pelvic fracture Had hardware in the past that has been removed No problems with NSVD at last delivery  5. History of DVT of lower extremity Provoked in setting of pelvic fracture Not on prophylaxis  6. Multigravida of advanced maternal age in third trimester   Preterm labor symptoms and general obstetric precautions including but not limited to vaginal bleeding, contractions, leaking of fluid and fetal movement were reviewed in detail with the patient. Please refer to After Visit Summary for other counseling recommendations.  Return in 1 week (on 10/18/2021) for [facility], ob visit, needs MD, cerclage removal.   Dione Plover Annice Needy, MD

## 2021-10-11 NOTE — Patient Instructions (Signed)
Eleccin del mtodo anticonceptivo Contraception Choices La anticoncepcin, o los mtodos anticonceptivos, hace referencia a los mtodos o dispositivos que evitan el [place]. Mtodos hormonales Implante anticonceptivo Un implante anticonceptivo consiste en un tubo delgado de plstico que contiene una hormona que evita el [place]. Es diferente de un dispositivo intrauterino (DIU). Un mdico lo inserta en la parte superior del brazo. Los implantes pueden ser eficaces durante un mximo de 3 aos. Inyecciones de progestina sola Las inyecciones de progestina sola contienen progestina, una forma sinttica de la hormona progesterona. Un mdico las administra cada 3 meses. Pldoras anticonceptivas Las pldoras anticonceptivas son pastillas que contienen hormonas que evitan el [place]. Deben tomarse una vez al da, preferentemente a [place]. Se necesita una receta para utilizar este mtodo anticonceptivo. Parche anticonceptivo El parche anticonceptivo contiene hormonas que evitan el [place]. Se coloca en la piel, debe cambiarse una vez a la semana durante tres semanas y debe retirarse en la cuarta semana. Se necesita una receta para utilizar este mtodo anticonceptivo. Anillo vaginal Un anillo vaginal contiene hormonas que evitan el embarazo. Se coloca en la vagina durante tres semanas y se retira en la cuarta semana. Luego se repite el proceso con un anillo nuevo. Se necesita una receta para utilizar este mtodo anticonceptivo. Anticonceptivo de emergencia Los anticonceptivos de emergencia son mtodos para evitar un embarazo despus de Best boy sexo sin proteccin. Vienen en forma de pldora y pueden tomarse hasta 5 das despus de [place]. Funcionan mejor cuando se toman lo ms pronto posible luego de [employer]. La mayora de los anticonceptivos de emergencia estn disponibles sin receta mdica. Este mtodo no debe utilizarse como el nico mtodo anticonceptivo. Mtodos de  barrera Condn masculino Un condn masculino es una vaina delgada que se coloca sobre el pene durante el sexo. Los condones evitan que el esperma ingrese en el cuerpo de la [place]. Pueden utilizarse con un una sustancia que mata a los espermatozoides (espermicida) para aumentar la efectividad. Deben desecharse despus de un uso. Condn femenino Un condn femenino es una vaina blanda y holgada que se coloca en la vagina antes de [place]. El condn evita que el esperma ingrese en el cuerpo de la [place]. Deben desecharse despus de un uso. Diafragma Un diafragma es una barrera blanda con forma de cpula. Se inserta en la vagina antes del sexo, junto con un espermicida. El diafragma bloquea el ingreso de esperma en el tero, y el espermicida mata a los espermatozoides. El Designer, television/film set en la vagina durante 6 a 8 horas despus de Best boy sexo y debe retirarse en el plazo de las 24 horas. Un diafragma es recetado y colocado por un mdico. Debe reemplazarse cada 1 a 2 aos, despus de dar a luz, de aumentar ms de 15lb (6.8kg) y de [place] plvica. Capuchn cervical Un capuchn cervical es una copa redonda y blanda de ltex o plstico que se coloca en el cuello uterino. Se inserta en la vagina antes del sexo, junto con un espermicida. Bloquea el ingreso del esperma en el tero. El capuchn Medical illustrator durante 6 a 8 horas despus de Best boy sexo y debe retirarse en el plazo de las 48 horas. Un capuchn cervical debe ser recetado y colocado por un mdico. Debe reemplazarse cada 2aos. Esponja Una esponja es una pieza blanda y circular de espuma de poliuretano que contiene espermicida. La esponja ayuda a bloquear el ingreso de esperma en el tero, y el espermicida mata a  los espermatozoides. Marylyn Ishihara, debe humedecerla e insertarla en la vagina. Debe insertarse antes de [employer], debe permanecer dentro al menos durante 6 horas despus de tener sexo y debe retirarse y  Aeronautical engineer en el plazo de las 30 horas. Espermicidas Los espermicidas son sustancias qumicas que matan o bloquean al esperma y no lo dejan ingresar al cuello uterino y al tero. Vienen en forma de crema, gel, supositorio, espuma o comprimido. Un espermicida debe insertarse en la vagina con un aplicador al menos 10 o 15 minutos antes de tener sexo para dar tiempo a que surta [place]. El proceso debe repetirse cada vez que tenga sexo. Los espermicidas no requieren Furniture conservator/restorer. Anticonceptivos intrauterinos Dispositivo intrauterino (DIU) Un DIU es un dispositivo en forma de T que se coloca en el tero. Existen dos tipos: DIU hormonal.Este tipo contiene progestina, una forma sinttica de la hormona progesterona. Este tipo puede permanecer colocado durante 3 a 5 aos. DIU de cobre.Este tipo est recubierto con un alambre de cobre. Puede permanecer colocado durante 10 aos. Mtodos anticonceptivos permanentes Ligadura de trompas en la mujer En este mtodo, se sellan, atan u obstruyen las trompas de [place]op[place] durante una ciruga para Product/process development scientist que el vulo descienda Reader. Esterilizacin histeroscpica En este mtodo, se coloca un implante pequeo y flexible dentro de cada trompa de [place]opio. Los implantes hacen que se forme un tejido cicatricial en las trompas de [place]op[place] y que las obstruya para que el espermatozoide no pueda llegar al vulo. El procedimiento demora alrededor de 3 meses para que sea [place]. Debe utilizarse otro mtodo anticonceptivo durante esos 3 meses. Esterilizacin masculina Este es un procedimiento que consiste en atar los conductos que transportan el esperma (vasectoma). Luego del procedimiento, el hombre Equities trader lquido (semen). Debe utilizarse otro mtodo anticonceptivo durante 3 meses despus del procedimiento. Mtodos de planificacin natural Planificacin familiar natural En este mtodo, la pareja no tiene [employer] la mujer podra quedar  [place]. Mtodo calendario En este mtodo, la mujer realiza un seguimiento de la duracin de cada ciclo menstrual, identifica los [employer] que se puede producir un Media planner y no tiene sexo durante esos das. Mtodo de la ovulacin En este mtodo, la pareja evita tener sexo durante la ovulacin. Mtodo sintotrmico Este mtodo implica no tener sexo durante la ovulacin. Normalmente, la mujer comprueba la ovulacin al observar cambios en su temperatura y en la consistencia del moco cervical. Mtodo posovulacin En este mtodo, la pareja espera a que finalice la ovulacin para Adult nurse. Dnde buscar ms informacin Centers for Disease Control and Prevention (Centros para el Control y Publishing copy de Arboriculturist): http://www.wolf.info/ Resumen La anticoncepcin, o los mtodos anticonceptivos, hace referencia a los mtodos o dispositivos que evitan el [place]. Los mtodos anticonceptivos hormonales incluyen implantes, inyecciones, pastillas, parches, anillos vaginales y anticonceptivos de Freight forwarder. Los mtodos anticonceptivos de barrera pueden incluir condones masculinos, condones femeninos, diafragmas, capuchones cervicales, esponjas y espermicidas. [employer] Holdings Corporation tipos de DIU (dispositivo intrauterino). Un DIU puede colocarse en el tero de una mujer para evitar el embarazo durante 3 a 5 aos. La esterilizacin permanente puede realizarse mediante un procedimiento tanto en los hombres como en las mujeres. Los NIKE de Marine scientist natural implican no tener [employer] la mujer podra quedar [place]. Esta informacin no tiene Marine scientist el consejo del mdico. Asegrese de hacerle al mdico cualquier pregunta que tenga. Document Revised: 05/17/2020 Document Reviewed: 05/17/2020 Elsevier Patient Education  2022 Elsevier  Inc. ° °

## 2021-10-18 ENCOUNTER — Other Ambulatory Visit: Payer: Self-pay

## 2021-10-18 ENCOUNTER — Ambulatory Visit (INDEPENDENT_AMBULATORY_CARE_PROVIDER_SITE_OTHER): Payer: Self-pay

## 2021-10-18 ENCOUNTER — Encounter: Payer: Self-pay | Admitting: Family Medicine

## 2021-10-18 ENCOUNTER — Ambulatory Visit (INDEPENDENT_AMBULATORY_CARE_PROVIDER_SITE_OTHER): Payer: Self-pay | Admitting: General Practice

## 2021-10-18 ENCOUNTER — Other Ambulatory Visit: Payer: Self-pay | Admitting: Advanced Practice Midwife

## 2021-10-18 ENCOUNTER — Ambulatory Visit (INDEPENDENT_AMBULATORY_CARE_PROVIDER_SITE_OTHER): Payer: Self-pay | Admitting: Family Medicine

## 2021-10-18 VITALS — BP 110/75 | HR 80 | Wt 192.0 lb

## 2021-10-18 DIAGNOSIS — O24415 Gestational diabetes mellitus in pregnancy, controlled by oral hypoglycemic drugs: Secondary | ICD-10-CM

## 2021-10-18 DIAGNOSIS — O0990 Supervision of high risk pregnancy, unspecified, unspecified trimester: Secondary | ICD-10-CM

## 2021-10-18 DIAGNOSIS — Z3A37 37 weeks gestation of pregnancy: Secondary | ICD-10-CM

## 2021-10-18 DIAGNOSIS — O3433 Maternal care for cervical incompetence, third trimester: Secondary | ICD-10-CM

## 2021-10-18 NOTE — Progress Notes (Signed)
Pt informed that the ultrasound is considered a limited OB ultrasound and is not intended to be a complete ultrasound exam.  Patient also informed that the ultrasound is not being completed with the intent of assessing for fetal or placental anomalies or any pelvic abnormalities.  Explained that the purpose of todays ultrasound is to assess for  BPP, presentation, and AFI.  Patient acknowledges the purpose of the exam and the limitations of the study.     Koren Bound RN BSN 10/18/21

## 2021-10-18 NOTE — Progress Notes (Signed)
° ° °  Subjective:  Hailey Walsh is a 40 y.o. [id number] at [redacted]w[redacted]d being seen today for ongoing prenatal care.  She is currently monitored for the following issues for this high-risk pregnancy and has History of DVT of lower extremity; History of pelvic fracture; Cough variant asthma; Pulmonary nodule; Supervision of high risk pregnancy, antepartum; AMA (advanced maternal age) multigravida 35+; Cervical insufficiency during pregnancy, antepartum; History of cervical cerclage; Cervical cerclage suture present; Migraine without aura and without status migrainosus, not intractable; Pregnancy headache in second trimester; Gestational diabetes mellitus (GDM) in third trimester; [redacted] weeks gestation of pregnancy; Food insecurity; and [redacted] weeks gestation of pregnancy on their problem list.  Patient reports  occasional contractions and some pressure for the last week or so. Whenever she presses on her vaginal area, the pressure resolves.  Contractions: Irritability.  .  Movement: Present. Denies leaking of fluid. She is still taking metformin as prescribed. Fasting CBGs around 95-100, postprandial ranging 115-130.   Spanish interpreter present for duration of visit.   The following portions of the patient's history were reviewed and updated as appropriate: allergies, current medications, past family history, past medical history, past social history, past surgical history and problem list.   Objective:   Vitals:   10/18/21 2208  BP: 110/75  Pulse: 80  Weight: 192 lb (87.1 kg)    Movement: Present  Presentation: Vertex  General:  Alert, oriented and cooperative. Patient is in no acute distress.  Skin: Skin is warm and dry. No rash noted.   Cardiovascular: Normal heart rate noted  Respiratory: Normal respiratory effort, no problems with respiration noted  Abdomen: Soft, gravid, appropriate for gestational age. Pain/Pressure: Present     Pelvic:  Cervical exam performed in the presence of a chaperone  Dilation: 1.5 Effacement (%): 60 Station: -3. Mild bladder prolapse noted.   Extremities: Normal range of motion.     Mental Status: Normal mood and affect. Normal behavior. Normal judgment and thought content.    Cerclage removal:  Written consent was obtained. Dr. Dione Plover was present and assisted with removal. After fetal presentation was verified on ultrasound (BPP immediately prior to appt with vertex presentation), a vaginal speculum was placed. Anterior knot of cervical cerclage was recognized and pulled upwards to visualize both sides of the circumferential suture under the knot. One side was cut and the remaining suture was pulled and removed intact. There was some minimal bleeding noted.  Speculum was removed, and cervix was checked and found to be the above.   Assessment and Plan:  Pregnancy: [id number] at [redacted]w[redacted]d  1. Supervision of high risk pregnancy, antepartum Doing well with normal fetal movement. BPP 8/10 today.   2. [redacted] weeks gestation of pregnancy  3. Cervical cerclage suture present in third trimester Removed today, tolerated well.   4. Gestational diabetes mellitus (GDM) in third trimester controlled on oral hypoglycemic drug Still just slightly above goal. Endorses adherence to metformin and well balanced diet, unlikely to benefit from up-titration of medications or addition of insulin at this time. Discussed with Dr. Dione Plover, plan for IOL at 38 weeks if not spontaneous labor prior to. Induction orders placed.   Term labor symptoms and general obstetric precautions including but not limited to vaginal bleeding, contractions, leaking of fluid and fetal movement were reviewed in detail with the patient. Please refer to After Visit Summary for other counseling recommendations.   Patriciaann Clan, DO

## 2021-10-23 ENCOUNTER — Other Ambulatory Visit (HOSPITAL_COMMUNITY): Payer: Self-pay | Admitting: *Deleted

## 2021-10-23 DIAGNOSIS — O09523 Supervision of elderly multigravida, third trimester: Secondary | ICD-10-CM

## 2021-10-23 DIAGNOSIS — O3430 Maternal care for cervical incompetence, unspecified trimester: Secondary | ICD-10-CM

## 2021-10-23 DIAGNOSIS — O0990 Supervision of high risk pregnancy, unspecified, unspecified trimester: Secondary | ICD-10-CM

## 2021-10-24 ENCOUNTER — Inpatient Hospital Stay (HOSPITAL_COMMUNITY)
Admission: RE | Admit: 2021-10-24 | Discharge: 2021-10-26 | DRG: 807 | Disposition: A | Discharge status: Home / Self Care | Payer: Medicaid Other | Attending: Obstetrics and Gynecology | Admitting: Obstetrics and Gynecology

## 2021-10-24 ENCOUNTER — Inpatient Hospital Stay (HOSPITAL_COMMUNITY): Payer: Medicaid Other

## 2021-10-24 ENCOUNTER — Other Ambulatory Visit: Payer: Self-pay

## 2021-10-24 ENCOUNTER — Encounter (HOSPITAL_COMMUNITY): Payer: Self-pay | Admitting: Family Medicine

## 2021-10-24 DIAGNOSIS — O24419 Gestational diabetes mellitus in pregnancy, unspecified control: Secondary | ICD-10-CM | POA: Diagnosis present

## 2021-10-24 DIAGNOSIS — Z20822 Contact with and (suspected) exposure to covid-19: Secondary | ICD-10-CM | POA: Diagnosis present

## 2021-10-24 DIAGNOSIS — Z3A38 38 weeks gestation of pregnancy: Secondary | ICD-10-CM

## 2021-10-24 DIAGNOSIS — Z23 Encounter for immunization: Secondary | ICD-10-CM

## 2021-10-24 DIAGNOSIS — O24425 Gestational diabetes mellitus in childbirth, controlled by oral hypoglycemic drugs: Principal | ICD-10-CM | POA: Diagnosis present

## 2021-10-24 DIAGNOSIS — O0990 Supervision of high risk pregnancy, unspecified, unspecified trimester: Secondary | ICD-10-CM

## 2021-10-24 DIAGNOSIS — Z87891 Personal history of nicotine dependence: Secondary | ICD-10-CM | POA: Diagnosis not present

## 2021-10-24 DIAGNOSIS — O24435 Gestational diabetes mellitus in puerperium, controlled by oral hypoglycemic drugs: Secondary | ICD-10-CM

## 2021-10-24 DIAGNOSIS — Z86718 Personal history of other venous thrombosis and embolism: Secondary | ICD-10-CM | POA: Diagnosis not present

## 2021-10-24 DIAGNOSIS — O3430 Maternal care for cervical incompetence, unspecified trimester: Secondary | ICD-10-CM

## 2021-10-24 DIAGNOSIS — O09523 Supervision of elderly multigravida, third trimester: Secondary | ICD-10-CM

## 2021-10-24 LAB — CBC
HCT: 39.4 % (ref 36.0–46.0)
Hemoglobin: 13.7 g/dL (ref 12.0–15.0)
MCH: 32.2 pg (ref 26.0–34.0)
MCHC: 34.8 g/dL (ref 30.0–36.0)
MCV: 92.5 fL (ref 80.0–100.0)
Platelets: 163 10*3/uL (ref 150–400)
RBC: 4.26 MIL/uL (ref 3.87–5.11)
RDW: 13.6 % (ref 11.5–15.5)
WBC: 7.1 10*3/uL (ref 4.0–10.5)
nRBC: 0 % (ref 0.0–0.2)

## 2021-10-24 LAB — TYPE AND SCREEN
ABO/RH(D): O POS
Antibody Screen: NEGATIVE

## 2021-10-24 LAB — RESP PANEL BY RT-PCR (FLU A&B, COVID) ARPGX2
Influenza A by PCR: NEGATIVE
Influenza B by PCR: NEGATIVE
SARS Coronavirus 2 by RT PCR: NEGATIVE

## 2021-10-24 LAB — GLUCOSE, CAPILLARY
Glucose-Capillary: 89 mg/dL (ref 70–99)
Glucose-Capillary: 89 mg/dL (ref 70–99)
Glucose-Capillary: 107 mg/dL — ABNORMAL HIGH (ref 70–99)
Glucose-Capillary: 83 mg/dL (ref 70–99)

## 2021-10-24 LAB — RPR: RPR Ser Ql: NONREACTIVE

## 2021-10-24 MED ORDER — ONDANSETRON HCL 4 MG/2ML IJ SOLN: 4.0000 mg | Freq: Four times a day (QID) | INTRAMUSCULAR | Status: DC | PRN

## 2021-10-24 MED ORDER — TERBUTALINE SULFATE 1 MG/ML IJ SOLN: 0.2500 mg | Freq: Once | INTRAMUSCULAR | Status: DC | PRN

## 2021-10-24 MED ORDER — FENTANYL CITRATE (PF) 100 MCG/2ML IJ SOLN
50.0000 ug | INTRAMUSCULAR | Status: DC | PRN
Start: 2021-10-24 — End: 2021-10-24
  Administered 2021-10-24: 19:00:00 100 ug via INTRAVENOUS
  Filled 2021-10-24: qty 2

## 2021-10-24 MED ORDER — PRENATAL MULTIVITAMIN CH
1.0000 | ORAL_TABLET | Freq: Every day | ORAL | Status: DC
  Administered 2021-10-25 – 2021-10-26 (×2): 1 via ORAL
  Filled 2021-10-24 (×2): qty 1

## 2021-10-24 MED ORDER — DIBUCAINE (PERIANAL) 1 % EX OINT: 1.0000 "application " | TOPICAL_OINTMENT | CUTANEOUS | Status: DC | PRN

## 2021-10-24 MED ORDER — COCONUT OIL OIL: 1.0000 "application " | TOPICAL_OIL | Status: DC | PRN

## 2021-10-24 MED ORDER — OXYCODONE-ACETAMINOPHEN 5-325 MG PO TABS: 2.0000 | ORAL_TABLET | ORAL | Status: DC | PRN

## 2021-10-24 MED ORDER — LIDOCAINE HCL (PF) 1 % IJ SOLN: 30.0000 mL | INTRAMUSCULAR | Status: DC | PRN

## 2021-10-24 MED ORDER — BENZOCAINE-MENTHOL 20-0.5 % EX AERO: 1.0000 "application " | INHALATION_SPRAY | CUTANEOUS | Status: DC | PRN

## 2021-10-24 MED ORDER — SOD CITRATE-CITRIC ACID 500-334 MG/5ML PO SOLN: 30.0000 mL | ORAL | Status: DC | PRN

## 2021-10-24 MED ORDER — ACETAMINOPHEN 325 MG PO TABS: 650.0000 mg | ORAL_TABLET | ORAL | Status: DC | PRN

## 2021-10-24 MED ORDER — TETANUS-DIPHTH-ACELL PERTUSSIS 5-2.5-18.5 LF-MCG/0.5 IM SUSY: 0.5000 mL | PREFILLED_SYRINGE | Freq: Once | INTRAMUSCULAR | Status: DC

## 2021-10-24 MED ORDER — SENNOSIDES-DOCUSATE SODIUM 8.6-50 MG PO TABS
2.0000 | ORAL_TABLET | Freq: Every day | ORAL | Status: DC
  Administered 2021-10-25 – 2021-10-26 (×2): 2 via ORAL
  Filled 2021-10-24 (×2): qty 2

## 2021-10-24 MED ORDER — ONDANSETRON HCL 4 MG/2ML IJ SOLN: 4.0000 mg | INTRAMUSCULAR | Status: DC | PRN

## 2021-10-24 MED ORDER — IBUPROFEN 600 MG PO TABS
600.0000 mg | ORAL_TABLET | Freq: Four times a day (QID) | ORAL | Status: DC
  Administered 2021-10-24 – 2021-10-26 (×7): 600 mg via ORAL
  Filled 2021-10-24 (×7): qty 1

## 2021-10-24 MED ORDER — OXYTOCIN-SODIUM CHLORIDE 30-0.9 UT/500ML-% IV SOLN
1.0000 m[IU]/min | INTRAVENOUS | Status: DC
  Administered 2021-10-24: 08:00:00 2 m[IU]/min via INTRAVENOUS

## 2021-10-24 MED ORDER — ONDANSETRON HCL 4 MG PO TABS: 4.0000 mg | ORAL_TABLET | ORAL | Status: DC | PRN

## 2021-10-24 MED ORDER — ZOLPIDEM TARTRATE 5 MG PO TABS: 5.0000 mg | ORAL_TABLET | Freq: Every evening | ORAL | Status: DC | PRN

## 2021-10-24 MED ORDER — OXYTOCIN BOLUS FROM INFUSION
333.0000 mL | Freq: Once | INTRAVENOUS | Status: AC
  Administered 2021-10-24: 20:00:00 333 mL via INTRAVENOUS

## 2021-10-24 MED ORDER — WITCH HAZEL-GLYCERIN EX PADS: 1.0000 "application " | MEDICATED_PAD | CUTANEOUS | Status: DC | PRN

## 2021-10-24 MED ORDER — LACTATED RINGERS IV SOLN: 500.0000 mL | INTRAVENOUS | Status: DC | PRN

## 2021-10-24 MED ORDER — SIMETHICONE 80 MG PO CHEW: 80.0000 mg | CHEWABLE_TABLET | ORAL | Status: DC | PRN

## 2021-10-24 MED ORDER — OXYCODONE-ACETAMINOPHEN 5-325 MG PO TABS: 1.0000 | ORAL_TABLET | ORAL | Status: DC | PRN

## 2021-10-24 MED ORDER — DIPHENHYDRAMINE HCL 25 MG PO CAPS: 25.0000 mg | ORAL_CAPSULE | Freq: Four times a day (QID) | ORAL | Status: DC | PRN

## 2021-10-24 MED ORDER — OXYTOCIN-SODIUM CHLORIDE 30-0.9 UT/500ML-% IV SOLN
2.5000 [IU]/h | INTRAVENOUS | Status: DC
  Filled 2021-10-24: qty 500

## 2021-10-24 MED ORDER — LACTATED RINGERS IV SOLN: INTRAVENOUS | Status: DC

## 2021-10-24 NOTE — H&P (Signed)
OBSTETRIC ADMISSION HISTORY AND PHYSICAL  Hailey Walsh is a 40 y.o. female [phone] with IUP at 24w0dby 12.0 wks Walsh presenting for IOL for A2GDM on Metformin.   Reports fetal movement. Denies vaginal bleeding.  [facility]  Support person in labor: Hailey Walsh: Normal>> F/U [place](10/05/21) Est. FW: 2735  gm  6 lb  59%ile  Prenatal History/Complications: [id number] Incompetent cervix Cerclage  OB BOX: Nursing Staff Provider  Office Location  [facility] Dating LMP (unsure)--> 191w0dSKorea1/10/23)   Language   Spanish Anatomy USKoreaWNL  Flu Vaccine   Genetic/Carrier Screen  NIPS:    AFP:    Horizon:  TDaP Vaccine    Hgb A1C or  GTT Early 5.5% Third trimester   COVID Vaccine    LAB RESULTS   Rhogam   Blood Type O/Positive/-- (06/23 1554)   Baby Feeding Plan  Breast  Antibody Negative (06/23 1554)  Contraception  Rubella 3.02 (06/23 1554) > IMMUNE  Circumcision  RPR Non Reactive (06/23 1554)   Pediatrician   HBsAg Negative (06/23 1554)   Support Person  Husband HCVAb neg  Prenatal Classes  HIV Non Reactive (06/23 1554)     BTL Consent  GBS  negative (For PCN allergy, check sensitivities)   VBAC Consent  Pap        BP Cuff  needs to be given to pt  PHQ9 & GAD7 [  ] new OB [  ] 28 weeks  [  ] 36 weeks Induction  '[ ]'  Orders Entered '[ ]' Foley Y/N   Past Medical History: Past Medical History:  Diagnosis Date   Anxiety    patient denies anxiety   Depression    no meds currently   DV[place]deep [place] thrombosis) [place]   right lower leg   Incompetent cervix 10/29/2009    Past Surgical History: Past Surgical History:  Procedure Laterality Date   ABDOMINAL SURGERY     CERVICAL CERCLAGE  10/16/2010   CERVICAL CERCLAGE N/A 04/24/2016   Procedure: CERCLAGE CERVICAL;  Surgeon: TaDonnamae JudeMD;  [facility];  Service: Gynecology;  Laterality: N/A;   CERVICAL CERCLAGE N/A 05/04/2021   Procedure: CERCLAGE CERVICAL;   Surgeon: PiAletha HalimMD;  Location: [facility];  Service: Gynecology;  Laterality: N/A;   HARDWARE REMOVAL Right 05/17/2015   Procedure: HARDWARE REMOVAL RIGHT ILIAC SCREW;  Surgeon: MiAltamese CarolinaMD;  [facility] Service: Orthopedics;  Laterality: Right;   ORIF WRIST FRACTURE Left 12/17/2014   Procedure: OPEN REDUCTION INTERNAL FIXATION (ORIF) WRIST FRACTURE;  Surgeon: WiRoseanne KaufmanMD;  Location[facility] Service: Orthopedics;  Laterality: Left;   SACRO-ILIAC PINNING Left 12/17/2014   Procedure: SADub Mikes Surgeon: MiRozanna BoxMD;  [place] [facility] Service: [place]ics;  Laterality: Left;    Obstetrical History: OB History     Gravida  5   Para  2   Term  2   Preterm  0   AB  2   Living  2      SAB  2   IAB  0   Ectopic  0   Multiple  0   Live Births  2           Social History: Social History   Socioeconomic History   Marital status: Married    Spouse name: Not on file   Number of children: Not on  file   Years of education: Not on file   Highest education level: Not on file  Occupational History   Not on file  Tobacco Use   Smoking status: Former    Types: Cigarettes    Quit date: 02/26/2021    Years since quitting: 0.6   Smokeless tobacco: Never   Tobacco comments:    Intermittent smoker-occasional  Vaping Use   Vaping Use: Never used  Substance and Sexual Activity   Alcohol use: Not Currently    Comment: special events only   Drug use: No   Sexual activity: Yes    Birth control/protection: Condom  Other Topics Concern   Not on file  Social History Narrative   Not on file   Social Determinants of Health   Financial Resource Strain: Not on file  Food Insecurity: No Food Insecurity   Worried About Running Out of Food in the Last Year: Never true   Ran Out of Food in the Last Year: Never true  Transportation Needs: No Transportation Needs   Lack of Transportation (Medical): No   Lack of Transportation  (Non-Medical): No  Physical Activity: Not on file  Stress: Not on file  Social Connections: Not on file    Family History: Family History  Problem Relation Age of Onset   Cancer Mother    Hypertension Mother    Colon cancer Mother    Diabetes Brother    Uterine cancer Sister     Allergies: No Known Allergies  Medications Prior to Admission  Medication Sig Dispense Refill Last Dose   acetaminophen (TYLENOL) 325 MG tablet Take 2 tablets (650 mg total) by mouth every 4 (four) hours as needed (for pain scale < 4). 30 tablet 0    aspirin EC 81 MG tablet Take 1 tablet (81 mg total) by mouth daily. Take after 12 weeks for prevention of preeclampsia later in pregnancy 300 tablet 2    Blood Glucose Monitoring Suppl (TRUE METRIX METER) w/Device KIT use as directed  Check blood sugars for fasting, and two hours after breakfast, lunch and dinner (4 checks daily) 1 kit 0    glucose blood (TRUE METRIX BLOOD GLUCOSE TEST) test strip Use as instructed to check blood sugars 100 each 12    metFORMIN (GLUCOPHAGE) 500 MG tablet Take 2 tablets (1,000 mg total) by mouth daily with supper. 60 tablet 2    Prenatal Vit-Fe Fumarate-FA (PRENATAL VITAMINS PO) Take 1 tablet by mouth daily.      terconazole (TERAZOL 7) 0.4 % vaginal cream Place 1 applicator vaginally at bedtime. 45 g 0    TRUEplus Lancets 28G MISC use as directed 4 (four) times daily. 100 each 12      Review of Systems  All systems reviewed and negative except as stated in HPI  Blood pressure 120/82, pulse 90, temperature 99.1 F (37.3 C), temperature source Oral, resp. rate 17, height 4' 11.06" (1.5 m), weight 87.5 kg, last menstrual period 01/23/2021. General appearance: alert, cooperative, no distress, and moderately obese Lungs: no respiratory distress Heart: regular rate  Abdomen: soft, non-tender; gravid b Pelvic: deferred Extremities: Homans sign is negative, no sign of DVT Presentation: cephalic by RN's VE Fetal monitoring: 140  bpm / moderate variability / accels present / decels absent  Uterine activity: irregular every 5-9 mins  Dilation: 3 Effacement (%): 60 Station: -2 Exam by:: Cecelia Byars  Prenatal labs: ABO, Rh: --/--/O POS (12/27 0755) Antibody: NEG (12/27 0755) Rubella: 3.02 (06/23 1554) RPR: Non Reactive (  10/13 0826)  HBsAg: Negative (06/23 1554)  HIV: Non Reactive (10/13 0826)  GBS: Negative/-- (12/08 1004)  Glucola: Abnormal [phone] Genetic screening:    Prenatal Transfer Tool  Maternal Diabetes: Yes:  Diabetes Type:  Insulin/Medication controlled Genetic Screening: no results found Maternal Ultrasounds/Referrals: Normal Fetal Ultrasounds or other Referrals:  None Maternal Substance Abuse:  No Significant Maternal Medications:  None Significant Maternal Lab Results: Group B Strep negative  Results for orders placed or performed during the hospital encounter of 10/24/21 (from the past 24 hour(s))  CBC   Collection Time: 10/24/21  7:55 AM  Result Value Ref Range   WBC 7.1 4.0 - 10.5 K/uL   RBC 4.26 3.87 - 5.11 MIL/uL   Hemoglobin 13.7 12.0 - 15.0 g/dL   HCT 39.4 36.0 - 46.0 %   MCV 92.5 80.0 - 100.0 fL   MCH 32.2 26.0 - 34.0 pg   MCHC 34.8 30.0 - 36.0 g/dL   RDW 13.6 11.5 - 15.5 %   Platelets 163 150 - 400 K/uL   nRBC 0.0 0.0 - 0.2 %  Type and screen   Collection Time: 10/24/21  7:55 AM  Result Value Ref Range   ABO/RH(D) O POS    Antibody Screen NEG    Sample Expiration      10/27/2021,2359 Performed at [facility], [place] [address], [place], [place] [address]   Glucose, capillary   Collection Time: 10/24/21  7:56 AM  Result Value Ref Range   Glucose-Capillary 89 70 - 99 mg/dL    Patient Active Problem List   Diagnosis Date Noted   Gestational diabetes 10/24/2021   [redacted] weeks gestation of pregnancy 10/05/2021   [redacted] weeks gestation of pregnancy 08/25/2021   Food insecurity 08/25/2021   Gestational diabetes mellitus (GDM) in third trimester 08/22/2021    Migraine without aura and without status migrainosus, not intractable 07/21/2021   Pregnancy headache in second trimester 07/21/2021   History of cervical cerclage 05/04/2021   Cervical cerclage suture present 05/04/2021   Supervision of high risk pregnancy, antepartum 04/03/2021   AMA (advanced maternal age) multigravida 35+ 04/03/2021   Cough variant asthma 08/16/2020   Pulmonary nodule 08/16/2020   History of pelvic fracture 05/13/2016   History of DVT of lower extremity 06/09/2014   Cervical insufficiency during pregnancy, antepartum 2011    Assessment/Plan:  Errica Melchor Magda Walsh is a 40 y.o. [id number] at 27w0dhere for IOL for A2GDM (uncontrolled)  Labor: IOL -- pain control: planning epidural  Fetal Wellbeing: EFW 6 lbs by [place] 10/05/21. Cephalic by RN's VE.  -- GBS (Neg) -- continuous fetal monitoring - category 1   Postpartum Planning -- breast/bottle -- unsure on ppIUD, pp Nexplanon or ppBTL for contraception   RLaury Deep CNM  10/24/2021, 10:04 AM

## 2021-10-24 NOTE — Progress Notes (Signed)
Hailey Walsh is a 40 y.o. [id number] at [redacted]w[redacted]d admitted for induction of labor due to uncontrolled A2GDM.  Subjective: Patient reporting the contractions are not as frequent as they were earlier. She is tolerating the pain well; describes as cramping. Spouse, Garlon Hatchet, is supportive at bedside.  Objective: BP 128/89    Pulse 87    Temp 98 F (36.7 C) (Oral)    Resp 16    Ht 4' 11.06" (1.5 m)    Wt 87.5 kg    LMP 01/23/2021 (Approximate)    BMI 38.91 kg/m  No intake/output data recorded. No intake/output data recorded.  FHT:  FHR: 135 bpm, variability: moderate,  accelerations:  Present,  decelerations:  Absent UC:   regular, every 3-4 minutes SVE:   Dilation: 4 Effacement (%): 70 Station: -2 Exam by: Roxanne Gates, CNM  Labs: Lab Results  Component Value Date   WBC 7.1 10/24/2021   HGB 13.7 10/24/2021   HCT 39.4 10/24/2021   MCV 92.5 10/24/2021   PLT 163 10/24/2021    Assessment / Plan: Induction of labor due to uncontrolled A2GDM,  progressing well  Labor: Progressing normally Preeclampsia:   n/a Fetal Wellbeing:  Category I Pain Control:  Labor support without medications I/D:  n/a Anticipated MOD:  NSVD  Laury Deep, CNM 10/24/2021, 4:26 PM

## 2021-10-24 NOTE — Plan of Care (Signed)
Problem: Education: Goal: Knowledge of Childbirth will improve Outcome: Completed/Met Goal: Ability to make informed decisions regarding treatment and plan of care will improve Outcome: Completed/Met Goal: Ability to state and carry out methods to decrease the pain will improve Outcome: Completed/Met Goal: Individualized Educational Video(s) Outcome: Completed/Met   Problem: Coping: Goal: Ability to verbalize concerns and feelings about labor and delivery will improve Outcome: Completed/Met   Problem: Life Cycle: Goal: Ability to make normal progression through stages of labor will improve Outcome: Completed/Met Goal: Ability to effectively push during vaginal delivery will improve Outcome: Completed/Met   Problem: Role Relationship: Goal: Will demonstrate positive interactions with the child Outcome: Completed/Met   Problem: Safety: Goal: Risk of complications during labor and delivery will decrease Outcome: Completed/Met   Problem: Pain Management: Goal: Relief or control of pain from uterine contractions will improve Outcome: Completed/Met   Problem: Education: Goal: Knowledge of General Education information will improve Description: Including pain rating scale, medication(s)/side effects and non-pharmacologic comfort measures Outcome: Progressing   Problem: Health Behavior/Discharge Planning: Goal: Ability to manage health-related needs will improve Outcome: Progressing   Problem: Clinical Measurements: Goal: Ability to maintain clinical measurements within normal limits will improve Outcome: Progressing Goal: Will remain free from infection Outcome: Progressing Goal: Diagnostic test results will improve Outcome: Progressing Goal: Respiratory complications will improve Outcome: Progressing Goal: Cardiovascular complication will be avoided Outcome: Progressing   Problem: Activity: Goal: Risk for activity intolerance will decrease Outcome: Progressing    Problem: Nutrition: Goal: Adequate nutrition will be maintained Outcome: Progressing   Problem: Coping: Goal: Level of anxiety will decrease Outcome: Progressing   Problem: Elimination: Goal: Will not experience complications related to bowel motility Outcome: Progressing Goal: Will not experience complications related to urinary retention Outcome: Progressing   Problem: Pain Managment: Goal: General experience of comfort will improve Outcome: Progressing   Problem: Safety: Goal: Ability to remain free from injury will improve Outcome: Progressing   Problem: Skin Integrity: Goal: Risk for impaired skin integrity will decrease Outcome: Progressing

## 2021-10-24 NOTE — Discharge Summary (Signed)
Postpartum Discharge Summary      Patient Name: Hailey Walsh DOB: 1981-04-16 MRN: [id number]  Date of admission: 10/24/2021 Delivery date:10/24/2021  Delivering provider: Laury Deep  Date of discharge: 10/26/2021  Admitting diagnosis: Gestational diabetes [O24.419] Intrauterine pregnancy: [redacted]w[redacted]d    Secondary diagnosis:  Principal Problem:   Gestational diabetes  Additional problems: none    Discharge diagnosis: Term Pregnancy Delivered and GDM A2                                              Post partum procedures: none Augmentation: AROM Complications: None  Hospital course: Induction of Labor With Vaginal Delivery   40y.o. yo [id number] 385w0das admitted to the hospital 10/24/2021 for induction of labor.  Indication for induction: A2 DM.  Patient had an uncomplicated labor course as follows: Membrane Rupture Time/Date: 4:21 PM ,10/24/2021   Delivery Method:Vaginal, Spontaneous  Episiotomy: None  Lacerations:  None  Details of delivery can be found in separate delivery note.  Patient had a routine postpartum course. Patient is discharged home 10/26/21.  Fasting glucose was not checked post-partum so it was stressed to the patient that postpartum f/u is particularly important to assess for DM.  Newborn Data: Birth date:10/24/2021  Birth time:7:21 PM  Gender:Female  Living status:Living  Apgars:9 ,9  Weight:3033 g   Magnesium Sulfate received: No BMZ received: No Rhophylac:N/A MMR:N/A T-DaP: no Flu: no Transfusion:No  Physical exam  Vitals:   10/25/21 0915 10/25/21 1530 10/25/21 2328 10/26/21 0516  BP: 112/71 111/70 111/81 127/84  Pulse: 77 70 85 64  Resp: '18 16 18 17  ' Temp: 98.6 F (37 C) 98.9 F (37.2 C) 98.3 F (36.8 C) 97.9 F (36.6 C)  TempSrc: Oral Oral Oral Oral  SpO2: 100% 100% 97% 100%  Weight:      Height:       General: alert, NAD Lochia: appropriate Uterine Fundus: firm Incision: N/A DVT Evaluation: No evidence of  DVT seen on physical exam. Labs: Lab Results  Component Value Date   WBC 7.1 10/24/2021   HGB 13.7 10/24/2021   HCT 39.4 10/24/2021   MCV 92.5 10/24/2021   PLT 163 10/24/2021   CMP Latest Ref Rng & Units 04/11/2016  Glucose 65 - 99 mg/dL 117(H)  BUN 7 - 25 mg/dL 10  Creatinine 0.50 - 1.10 mg/dL 0.63  Sodium 135 - 146 mmol/L 137  Potassium 3.5 - 5.3 mmol/L 4.2  Chloride 98 - 110 mmol/L 104  CO2 20 - 31 mmol/L 22  Calcium 8.6 - 10.2 mg/dL 8.8  Total Protein 6.1 - 8.1 g/dL 6.7  Total Bilirubin 0.2 - 1.2 mg/dL 0.3  Alkaline Phos 33 - 115 U/L 49  AST 10 - 30 U/L 17  ALT 6 - 29 U/L 15   Edinburgh Score: Edinburgh Postnatal Depression Scale Screening Tool 10/25/2021  I have been able to laugh and see the funny side of things. 0  I have looked forward with enjoyment to things. 0  I have blamed myself unnecessarily when things went wrong. 0  I have been anxious or worried for no good reason. 0  I have felt scared or panicky for no good reason. 0  Things have been getting on top of me. 0  I have been so unhappy that I have had difficulty sleeping. 0  I have  felt sad or miserable. 0  I have been so unhappy that I have been crying. 0  The thought of harming myself has occurred to me. 0  Edinburgh Postnatal Depression Scale Total 0     After visit meds:  Allergies as of 10/26/2021   No Known Allergies      Medication List     STOP taking these medications    aspirin EC 81 MG tablet   metFORMIN 500 MG tablet Commonly known as: Glucophage   True Metrix Blood Glucose Test test strip Generic drug: glucose blood   True Metrix Meter w/Device Kit   TRUEplus Lancets 28G Misc       TAKE these medications    acetaminophen 325 MG tablet Commonly known as: Tylenol Take 2 tablets (650 mg total) by mouth every 4 (four) hours as needed (for pain scale < 4).   ibuprofen 600 MG tablet Commonly known as: ADVIL Take 1 tablet (600 mg total) by mouth every 6 (six) hours.    PRENATAL VITAMINS PO Take 1 tablet by mouth daily.   terconazole 0.4 % vaginal cream Commonly known as: TERAZOL 7 Place 1 applicator vaginally at bedtime.         Discharge home in stable condition Infant Feeding: Bottle and Breast Infant Disposition:home with mother Discharge instruction: per After Visit Summary and Postpartum booklet. Activity: Advance as tolerated. Pelvic rest for 6 weeks.  Diet: routine diet Future Appointments:No future appointments. Follow up Visit:  [place] [facility] [place] [facility]. Schedule an appointment as soon as possible for a visit in 6 week(s).   Specialty: Obstetrics and Gynecology Why: postpartum visit Contact information: [place] [phone]                Message sent to [facility] by Laury Deep, CNM on 10/24/21 Please schedule this patient for a In person postpartum visit in 6 weeks with the following provider: MD. Additional Postpartum F/U:2 hour GTT  High risk pregnancy complicated by: GDM Delivery mode:  Vaginal, Spontaneous  Anticipated Birth Control:  LARC (nexplanon vs IUD)   10/26/2021 Desma Maxim, MD

## 2021-10-24 NOTE — Progress Notes (Signed)
Hailey Walsh, Hailey Walsh, at bedside for all admission questions and beginning of induction. All questions answered and clear liquids ordered for pt.

## 2021-10-25 ENCOUNTER — Encounter: Payer: Self-pay | Admitting: Family Medicine

## 2021-10-25 ENCOUNTER — Other Ambulatory Visit: Payer: Self-pay

## 2021-10-25 NOTE — Social Work (Signed)
CSW received consult for hx of Anxiety, Depression and PPD. CSW met with MOB to offer support and complete assessment.    CSW used spanish interpreter # [place] [phone]  CSW met with MOB at bedside and introduced CSW. CSW observed MOB lying in the bed and FOB present at bedside. MOB presented calm and receptive to [place] visit with  FOB present. CSW inquired how MOB has felt since giving birth. MOB reported feeling well with some bouts of cramping. MOB shared this L&D was a "rare experience but went good thank God." CSW inquired how MOB felt during the pregnancy. MOB denied history of anxiety and depression. MOB reported she experienced PPD ten years ago after the birth of her first child as evidenced by her crying a lot and feeling sad. MOB reported at the time she saw a psychologist which she felt was very helpful. MOB she did not experienced PPD following the second birth. MOB reported if she has concerns, she feels comfortable reaching out to psychologist again. MOB was receptive to education regarding the baby blues period vs. perinatal mood disorders. CSW discussed treatment and gave resources for mental health follow up if concerns arise.  CSW recommended MOB complete a self-evaluation during the postpartum time period using the New Mom Checklist from Postpartum Progress and encouraged MOB to contact a medical professional if symptoms are noted at any time. MOB denied thoughts of harm to self and others.   CSW provided review of Sudden Infant Death Syndrome (SIDS) precautions. MOB reported she has essential items for the infant including a crib where the infant will sleep. MOB has chosen [facility] for infant's follow up care and will have transportation to the appointments. MOB reported she received WIC and will follow up when the office reopens.   CSW identifies no further need for intervention and no barriers to discharge at this time.   Kathrin Greathouse, MSW, LCSW Women's and  [place] Worker  [phone] 10/25/2021  12:09 PM

## 2021-10-25 NOTE — Lactation Note (Signed)
This note was copied from a baby's chart. Lactation Consultation Note  Patient Name: Hailey Walsh [id number] Date: 10/25/2021 Reason for consult: Initial assessment;Early term 37-38.6wks Age:38 hours   Lactation Initial Consult:  Spanish interpreter, Hailey Walsh [phone]) used for interpretation.  Mother's feeding preference is breast/formula.  She has been primarily formula feeding since birth.  Mother informed me that she was not able to breast feed her first two children (ages 58 and 31 years old).  She was not successful with latching.  Baby slightly fussy; offered to assist and mother receptive.  Taught hand expression and finger fed colostrum drops.  Assisted to latch, however, baby was not at all interested in initiating a suck.  Gentle stimulation preformed.  Placed her STS on mother's chest where she lay quietly.  Breast feeding basics reviewed.  Mother will attempt breast feeding prior to giving any formula supplementation.  Encouraged STS and hand expressed colostrum drops.  Suggested she call her RN/LC for latch assistance as needed.  Mother will be obtaining formula from the [facility] department but plans on continuing to work on breast feeding.  No support person present at this time.  RN updated.   Maternal Data Has patient been taught Hand Expression?: Yes Does the patient have breastfeeding experience prior to this delivery?: No (Attempted but mother stated her other two children would not latch)  Feeding Mother's Current Feeding Choice: Breast Milk and Formula Nipple Type: Slow - flow  LATCH Score Latch: Repeated attempts needed to sustain latch, nipple held in mouth throughout feeding, stimulation needed to elicit sucking reflex.  Audible Swallowing: None  Type of Nipple: Everted at rest and after stimulation (Short shafted)  Comfort (Breast/Nipple): Soft / non-tender  Hold (Positioning): Assistance needed to correctly position infant at breast and maintain  latch.  LATCH Score: 6   Lactation Tools Discussed/Used    Interventions Interventions: Breast feeding basics reviewed;Assisted with latch;Skin to skin;Breast massage;Hand express;Adjust position;Breast compression;Expressed milk;Position options;Support pillows;Education;LC Magazine features editor  Discharge [place] Program: Yes  Consult Status Consult Status: Follow-up Date: 10/26/21 Follow-up type: In-patient    Hailey Walsh R Hailey Walsh 10/25/2021, 8:10 AM

## 2021-10-25 NOTE — Progress Notes (Signed)
Post Partum Day 1 S/p SVD 10/24/2021 @ 1921  Subjective: no complaints, up ad lib, voiding, tolerating PO, and + flatus  Objective: Blood pressure 112/71, pulse 77, temperature 98.6 F (37 C), temperature source Oral, resp. rate 18, height 4' 11.06" (1.5 m), weight 87.5 kg, last menstrual period 01/23/2021, SpO2 100 %.  Physical Exam:  General: alert, cooperative, appears stated age, and no distress Lochia: appropriate Uterine Fundus: firm Incision: N/A DVT Evaluation: No evidence of DVT seen on physical exam.  Recent Labs    10/24/21 0755  HGB 13.7  HCT 39.4    Assessment/Plan:  Recovering well PPD 1 Plan for discharge tomorrow Language barrier: remote Spanish language interpreter Elita Quick used for patient interaction   LOS: 1 day   Darlina Rumpf, [place] 10/25/2021, 9:38 AM

## 2021-10-26 ENCOUNTER — Other Ambulatory Visit: Payer: Self-pay

## 2021-10-26 MED ORDER — COVID-19MRNA BIVAL VACC PFIZER 30 MCG/0.3ML IM SUSP
0.3000 mL | Freq: Once | INTRAMUSCULAR | Status: AC
  Administered 2021-10-26: 14:00:00 0.3 mL via INTRAMUSCULAR
  Filled 2021-10-26: qty 0.3

## 2021-10-26 MED ORDER — IBUPROFEN 600 MG PO TABS
600.0000 mg | ORAL_TABLET | Freq: Four times a day (QID) | ORAL | 0 refills | Status: DC
  Filled 2021-10-26: qty 30, 8d supply, fill #0

## 2021-10-26 MED ORDER — INFLUENZA VAC SPLIT QUAD 0.5 ML IM SUSY
0.5000 mL | PREFILLED_SYRINGE | INTRAMUSCULAR | Status: AC
  Administered 2021-10-26: 13:00:00 0.5 mL via INTRAMUSCULAR
  Filled 2021-10-26: qty 0.5

## 2021-10-26 NOTE — Lactation Note (Signed)
This note was copied from a baby's chart. Lactation Consultation Note  Patient Name: Hailey Walsh [id number] Date: 10/26/2021 Reason for consult: Follow-up assessment Age:38 hours   Lactation Follow Up Consult:  Spanish interpreter [phone]) used for interpretation.  Family has been discharged.  Mother awaiting her vaccine administrations.  Mother has continued to practice hand expression and latching; reports baby does not sustain her latch for long periods.  I did not assist/observe a latch at this time; baby asleep in the bassinet.  Mother has been supplementing with formula.  Volumes have been appropriate and baby is voiding/stooling.  Mother will be seeing the lactation consultant at the pediatrician's office tomorrow.  Mother had no further questions/concerns.  Encouraged to continue latching prior to giving formula supplementation.  Manual pump at bedside.  Mother is a [facility] participant and may also obtain assistance at the [facility] office.  Father present.   Maternal Data    Feeding    LATCH Score Latch: Repeated attempts needed to sustain latch, nipple held in mouth throughout feeding, stimulation needed to elicit sucking reflex.  Audible Swallowing: None  Type of Nipple: Everted at rest and after stimulation  Comfort (Breast/Nipple): Soft / non-tender  Hold (Positioning): Assistance needed to correctly position infant at breast and maintain latch.  LATCH Score: 6   Lactation Tools Discussed/Used    Interventions Interventions: Assisted with latch;Skin to skin;Adjust position;Support pillows  Discharge Discharge Education: Engorgement and breast care Pump: Manual  Consult Status Consult Status: Complete Date: 10/26/21 Follow-up type: Call as needed    Lanice Schwab Madisin Hasan 10/26/2021, 12:29 PM

## 2021-10-26 NOTE — Progress Notes (Signed)
Discharge teaching done with [place], Delphina Cahill [phone].

## 2021-11-06 ENCOUNTER — Telehealth (HOSPITAL_COMMUNITY): Payer: Self-pay | Admitting: *Deleted

## 2021-11-06 NOTE — Telephone Encounter (Signed)
Hospital discharge follow-up call completed with [address], [place]. Patient voiced no questions or concerns at this time. EPDS=5. Patient voiced no questions or concerns regarding infant at this time. Patient reports infant sleeps in a crib on his back. RN reviewed ABCs of safe sleep. Patient verbalized understanding. Erline Levine, RN, 11/06/21, [phone]

## 2021-11-10 ENCOUNTER — Other Ambulatory Visit: Payer: Self-pay

## 2021-12-04 ENCOUNTER — Ambulatory Visit: Payer: Self-pay | Admitting: Obstetrics and Gynecology

## 2021-12-04 ENCOUNTER — Other Ambulatory Visit: Payer: Self-pay

## 2021-12-15 NOTE — Progress Notes (Signed)
Hailey Walsh Visit Note  Hailey Walsh is a 41 y.o. [phone] female who presents for a postpartum visit. She is 8 weeks postpartum following a normal spontaneous vaginal delivery.  I have fully reviewed the prenatal and intrapartum course. The delivery was at [redacted]w[redacted]d gestational weeks.  Anesthesia: IV sedation. Postpartum course has been good. Baby is doing well. Baby is feeding by bottle Hailey Walsh . Bleeding no bleeding. Bowel function is normal. Bladder function is normal. Patient is not sexually active. Contraception method is none. Postpartum depression screening: negative. Interpreter present for the entire visit.  The pregnancy intention screening data noted above was reviewed. Potential methods of contraception were discussed. The patient elected to proceed with No data recorded.   Edinburgh Postnatal Depression Scale - 12/18/21 0850       Edinburgh Postnatal Depression Scale:  In the Past 7 Days   I have been able to laugh and see the funny side of things. 0    I have looked forward with enjoyment to things. 0    I have blamed myself unnecessarily when things went wrong. 0    I have been anxious or worried for no good reason. 0    I have felt scared or panicky for no good reason. 0    Things have been getting on top of me. 1    I have been so unhappy that I have had difficulty sleeping. 0    I have felt sad or miserable. 0    I have been so unhappy that I have been crying. 0    The thought of harming myself has occurred to me. 0    Edinburgh Postnatal Depression Scale Total 1             Health Maintenance Due  Topic Date Due   PAP SMEAR-Modifier  09/28/2013   COVID-19 Vaccine (2 - Pfizer series) 11/16/2021    The following portions of the patient's history were reviewed and updated as appropriate: allergies, current medications, past family history, past medical history, past social history, past surgical history, and problem list.  Review of Systems Pertinent  items noted in HPI and remainder of comprehensive ROS otherwise negative.  Objective:  BP 132/79    Pulse 60    Wt 185 lb (83.9 kg)    LMP 01/23/2021 (Approximate)    Breastfeeding No    BMI 37.30 kg/m    General:  alert, cooperative, and no distress   Breasts:  not indicated  Lungs: clear to auscultation bilaterally  Heart:  regular rate and rhythm, S1, S2 normal, no murmur, click, rub or gallop  Abdomen: soft, non-tender; bowel sounds normal; no masses,  no organomegaly   Wound NA  GU exam:  not indicated       Assessment:    There are no diagnoses linked to this encounter.  Normal postpartum exam.   Plan:   Essential components of care per ACOG recommendations:  1.  Mood and well being: Patient with negative depression screening today. Reviewed local resources for support.  - Patient tobacco use? No.   - hx of drug use? No.    2. Infant care and feeding:  -Patient currently breastmilk feeding? No.  -Social determinants of health (SDOH) reviewed in EPIC. No concerns  3. Sexuality, contraception and birth spacing - Patient does not want a pregnancy in the next year.  - Reviewed forms of contraception in tiered fashion. Patient desired Depo-Provera today.  Wants Nexplanon and [facility] - Discussed birth spacing of 18 months  4. Sleep and fatigue -Encouraged family/partner/community support of 4 hrs of uninterrupted sleep to help with mood and fatigue  5. Physical Recovery  - Discussed patients delivery and complications. She describes her labor as good. - Patient had a Vaginal, no problems at delivery. Patient had  no  laceration. Perineal healing reviewed. Patient expressed understanding - Patient has urinary incontinence? No.  Advised kegel exercises and info given in AVS - Patient is safe to resume physical and sexual activity in 2 weeks.  6.  Health Maintenance - HM due items addressed Yes - Last pap smear No  results found for: DIAGPAP Pap smear not done at today's visit.  Referred to [facility] for pap. -Breast Cancer screening indicated? No.   7. Chronic Disease/Pregnancy Condition follow up:  Has 2 hour glucola today Referred to [facility] for pap due to no insurance  - PCP follow up  Hailey Rochester, NP [facility] [place]

## 2021-12-18 ENCOUNTER — Ambulatory Visit (INDEPENDENT_AMBULATORY_CARE_PROVIDER_SITE_OTHER): Payer: Self-pay | Admitting: Nurse Practitioner

## 2021-12-18 ENCOUNTER — Other Ambulatory Visit: Payer: Self-pay

## 2021-12-18 ENCOUNTER — Other Ambulatory Visit (HOSPITAL_COMMUNITY)
Admission: RE | Admit: 2021-12-18 | Discharge: 2021-12-18 | Disposition: A | Discharge status: Home / Self Care | Payer: Self-pay | Source: Ambulatory Visit | Attending: Obstetrics and Gynecology | Admitting: Obstetrics and Gynecology

## 2021-12-18 ENCOUNTER — Encounter: Payer: Self-pay | Admitting: Nurse Practitioner

## 2021-12-18 VITALS — BP 132/79 | HR 60 | Wt 185.0 lb

## 2021-12-18 DIAGNOSIS — O24415 Gestational diabetes mellitus in pregnancy, controlled by oral hypoglycemic drugs: Secondary | ICD-10-CM

## 2021-12-18 DIAGNOSIS — Z30013 Encounter for initial prescription of injectable contraceptive: Secondary | ICD-10-CM

## 2021-12-18 DIAGNOSIS — Z9889 Other specified postprocedural states: Secondary | ICD-10-CM

## 2021-12-18 DIAGNOSIS — R300 Dysuria: Secondary | ICD-10-CM

## 2021-12-18 DIAGNOSIS — N898 Other specified noninflammatory disorders of vagina: Secondary | ICD-10-CM | POA: Insufficient documentation

## 2021-12-18 LAB — POCT URINALYSIS DIP (DEVICE)
Bilirubin Urine: NEGATIVE
Glucose, UA: NEGATIVE mg/dL
Hgb urine dipstick: NEGATIVE
Ketones, ur: NEGATIVE mg/dL
Leukocytes,Ua: NEGATIVE
Nitrite: NEGATIVE
Protein, ur: NEGATIVE mg/dL
Specific Gravity, Urine: 1.025 (ref 1.005–1.030)
Urobilinogen, UA: 0.2 mg/dL (ref 0.0–1.0)
pH: 6 (ref 5.0–8.0)

## 2021-12-18 MED ORDER — MEDROXYPROGESTERONE ACETATE 150 MG/ML IM SUSP
150.0000 mg | Freq: Once | INTRAMUSCULAR | Status: AC
  Administered 2021-12-18: 150 mg via INTRAMUSCULAR

## 2021-12-18 NOTE — Patient Instructions (Addendum)
[facility] Phone: [phone] Address: [address], [place], [place] [address]

## 2021-12-19 ENCOUNTER — Other Ambulatory Visit: Payer: Self-pay

## 2021-12-19 ENCOUNTER — Telehealth: Payer: Self-pay

## 2021-12-19 ENCOUNTER — Encounter: Payer: Self-pay | Admitting: Nurse Practitioner

## 2021-12-19 DIAGNOSIS — R7301 Impaired fasting glucose: Secondary | ICD-10-CM | POA: Insufficient documentation

## 2021-12-19 LAB — CERVICOVAGINAL ANCILLARY ONLY
Bacterial Vaginitis (gardnerella): NEGATIVE
Candida Glabrata: NEGATIVE
Candida Vaginitis: POSITIVE — AB
Chlamydia: NEGATIVE
Comment: NEGATIVE
Comment: NEGATIVE
Comment: NEGATIVE
Comment: NEGATIVE
Comment: NEGATIVE
Comment: NORMAL
Neisseria Gonorrhea: NEGATIVE
Trichomonas: NEGATIVE

## 2021-12-19 LAB — GLUCOSE TOLERANCE, 2 HOURS
Glucose, 2 hour: 98 mg/dL (ref 70–139)
Glucose, GTT - Fasting: 111 mg/dL — ABNORMAL HIGH (ref 70–99)

## 2021-12-19 MED ORDER — FLUCONAZOLE 150 MG PO TABS
150.0000 mg | ORAL_TABLET | Freq: Once | ORAL | 0 refills | Status: AC
  Filled 2021-12-19: qty 1, 1d supply, fill #0

## 2021-12-19 NOTE — Addendum Note (Signed)
Addended by: [name] on: 12/19/2021 01:52 PM   Modules accepted: Orders

## 2021-12-19 NOTE — Telephone Encounter (Addendum)
-----   Message from [name], NP sent at 12/19/2021  8:09 AM EST ----- Please call patient.  Fasting glucose was elevated so she will need to see PCP for follow up for continuing type 2 diabetes.  Please help her make an appointment at [facility].   [place]; next new pt appt is June 2023, first [facility] appt is April 2023. Called Patient Hailey Walsh, appt scheduled for 12/29/21 @ 1040. Called pt with interpreter Raquel. Pt agreeable to new appt. Given contact info for the office.

## 2021-12-26 ENCOUNTER — Other Ambulatory Visit: Payer: Self-pay

## 2021-12-29 ENCOUNTER — Encounter: Payer: Self-pay | Admitting: Nurse Practitioner

## 2021-12-29 ENCOUNTER — Other Ambulatory Visit: Payer: Self-pay

## 2021-12-29 ENCOUNTER — Ambulatory Visit (INDEPENDENT_AMBULATORY_CARE_PROVIDER_SITE_OTHER): Payer: Self-pay | Admitting: Nurse Practitioner

## 2021-12-29 VITALS — BP 124/93 | HR 78 | Temp 97.7°F | Ht 59.0 in | Wt 186.0 lb

## 2021-12-29 DIAGNOSIS — Z0000 Encounter for general adult medical examination without abnormal findings: Secondary | ICD-10-CM

## 2021-12-29 DIAGNOSIS — N898 Other specified noninflammatory disorders of vagina: Secondary | ICD-10-CM

## 2021-12-29 LAB — POCT URINALYSIS DIP (CLINITEK)
Bilirubin, UA: NEGATIVE
Blood, UA: NEGATIVE
Glucose, UA: NEGATIVE mg/dL
Ketones, POC UA: NEGATIVE mg/dL
Nitrite, UA: NEGATIVE
POC PROTEIN,UA: NEGATIVE
Spec Grav, UA: 1.025 (ref 1.010–1.025)
Urobilinogen, UA: 0.2 E.U./dL
pH, UA: 6.5 (ref 5.0–8.0)

## 2021-12-29 LAB — POCT GLYCOSYLATED HEMOGLOBIN (HGB A1C)
HbA1c POC (<> result, manual entry): 5.6 % (ref 4.0–5.6)
HbA1c, POC (controlled diabetic range): 5.6 % (ref 0.0–7.0)
HbA1c, POC (prediabetic range): 5.6 % — AB (ref 5.7–6.4)
Hemoglobin A1C: 5.6 % (ref 4.0–5.6)

## 2021-12-29 NOTE — Progress Notes (Signed)
[place], [place]  [address] Phone:  [phone]   Fax:  [phone] Subjective:   Patient ID: Hailey Walsh, female    DOB: 30-Jul-1981, 41 y.o.   MRN: [id number]  No chief complaint on file.  HPI Zaleigh Melchor Miralrio 41 y.o. female  has a past medical history of Anxiety, Depression, DVT (deep venous thrombosis) ([place]) (11/29/2014), and Incompetent cervix (10/29/2009). To the [facility] to establish care and for wellness visit. Last PCP vist 3 yrs ago.  Concerned today about possible diabetes due to history of gestational diabetes. Also concerned about B/P, suspects it may be high. Verbalizes vaginal itching x 4 days, with green discharge, odor unknown. Denies any evaluation or treatment of symptoms. Had similar symptoms in the past and was diagnosed with vaginal fungus. Denies any pain.  Currently unemployed, resides with children and husband. Monitors meals and exercises throughout the day completing chores. Denies any other concerns today.   Denies any fever. Denies any fatigue, chest pain, shortness of breath, HA or dizziness. Denies any blurred vision, numbness or tingling.  Past Medical History:  Diagnosis Date   Anxiety    patient denies anxiety   Depression    no meds currently   DVT (deep venous thrombosis) ([place]) 11/29/2014   right lower leg   Incompetent cervix 10/29/2009    Past Surgical History:  Procedure Laterality Date   ABDOMINAL SURGERY     CERVICAL CERCLAGE  10/16/2010   CERVICAL CERCLAGE N/A 04/24/2016   Procedure: CERCLAGE CERVICAL;  Surgeon: Donnamae Jude, MD;  Location: [facility];  Service: Gynecology;  Laterality: N/A;   CERVICAL CERCLAGE N/A 05/04/2021   Procedure: CERCLAGE CERVICAL;  Surgeon: Aletha Halim, MD;  Location: [facility];  Service: Gynecology;  Laterality: N/A;   HARDWARE REMOVAL Right 05/17/2015   Procedure: HARDWARE REMOVAL RIGHT ILIAC SCREW;  Surgeon: Altamese Inman Mills, MD;  Location: [facility];   Service: Orthopedics;  Laterality: Right;   ORIF WRIST FRACTURE Left 12/17/2014   Procedure: OPEN REDUCTION INTERNAL FIXATION (ORIF) WRIST FRACTURE;  Surgeon: Roseanne Kaufman, MD;  Location: [facility];  Service: Orthopedics;  Laterality: Left;   SACRO-ILIAC PINNING Left 12/17/2014   Procedure: Dub Mikes;  Surgeon: Rozanna Box, MD;  Location: [facility];  Service: Orthopedics;  Laterality: Left;    Family History  Problem Relation Age of Onset   Cancer Mother    Hypertension Mother    Colon cancer Mother    Diabetes Brother    Uterine cancer Sister     Social History   Socioeconomic History   Marital status: Married    Spouse name: Not on file   Number of children: Not on file   Years of education: Not on file   Highest education level: Not on file  Occupational History   Not on file  Tobacco Use   Smoking status: Former    Types: Cigarettes    Quit date: 02/26/2021    Years since quitting: 0.8   Smokeless tobacco: Never   Tobacco comments:    Intermittent smoker-occasional  Vaping Use   Vaping Use: Never used  Substance and Sexual Activity   Alcohol use: Not Currently    Comment: special events only   Drug use: No   Sexual activity: Yes    Birth control/protection: Condom  Other Topics Concern   Not on file  Social History Narrative   Not on file   Social Determinants of Health  Financial Resource Strain: Not on file  Food Insecurity: No Food Insecurity   Worried About Charity fundraiser in the Last Year: Never true   Arboriculturist in the Last Year: Never true  Transportation Needs: No Transportation Needs   Lack of Transportation (Medical): No   Lack of Transportation (Non-Medical): No  Physical Activity: Not on file  Stress: Not on file  Social Connections: Not on file  Intimate Partner Violence: Not on file    Outpatient Medications Prior to Visit  Medication Sig Dispense Refill   acetaminophen (TYLENOL) 325 MG tablet Take 2 tablets (650 mg  total) by mouth every 4 (four) hours as needed (for pain scale < 4). 30 tablet 0   ibuprofen (ADVIL) 600 MG tablet Take 1 tablet (600 mg total) by mouth every 6 (six) hours. 30 tablet 0   Prenatal Vit-Fe Fumarate-FA (PRENATAL VITAMINS PO) Take 1 tablet by mouth daily.     terconazole (TERAZOL 7) 0.4 % vaginal cream Place 1 applicator vaginally at bedtime. 45 g 0   No facility-administered medications prior to visit.    No Known Allergies  Review of Systems  Constitutional:  Negative for chills, fever and malaise/fatigue.  HENT: Negative.    Eyes: Negative.   Respiratory:  Negative for cough and shortness of breath.   Cardiovascular:  Negative for chest pain, palpitations and leg swelling.  Gastrointestinal:  Negative for abdominal pain, blood in stool, constipation, diarrhea, nausea and vomiting.  Genitourinary:  Negative for dysuria, flank pain, frequency, hematuria and urgency.       Vaginal discharge  Musculoskeletal: Negative.   Skin: Negative.   Neurological: Negative.   Psychiatric/Behavioral:  Negative for depression. The patient is not nervous/anxious.   All other systems reviewed and are negative.     Objective:    Physical Exam Vitals reviewed.  Constitutional:      General: She is not in acute distress.    Appearance: Normal appearance. She is obese.  HENT:     Head: Normocephalic.     Right Ear: Tympanic membrane, ear canal and external ear normal.     Left Ear: Tympanic membrane, ear canal and external ear normal.     Nose: Nose normal. No congestion or rhinorrhea.     Mouth/Throat:     Mouth: Mucous membranes are moist.     Pharynx: Oropharynx is clear. No oropharyngeal exudate or posterior oropharyngeal erythema.  Eyes:     General: No scleral icterus.       Right eye: No discharge.        Left eye: No discharge.     Extraocular Movements: Extraocular movements intact.     Conjunctiva/sclera: Conjunctivae normal.     Pupils: Pupils are equal, round, and  reactive to light.  Neck:     Vascular: No carotid bruit.  Cardiovascular:     Rate and Rhythm: Normal rate and regular rhythm.     Pulses: Normal pulses.     Heart sounds: Normal heart sounds.     Comments: No obvious peripheral edema Pulmonary:     Effort: Pulmonary effort is normal.     Breath sounds: Normal breath sounds.  Abdominal:     General: Abdomen is flat. Bowel sounds are normal. There is no distension.     Palpations: Abdomen is soft. There is no mass.     Tenderness: There is no abdominal tenderness. There is no right CVA tenderness, left CVA tenderness, guarding or rebound.  Hernia: No hernia is present.  Genitourinary:    Comments: Exam deferred  Musculoskeletal:        General: No swelling, tenderness, deformity or signs of injury. Normal range of motion.     Cervical back: Normal range of motion and neck supple. No rigidity or tenderness.     Right lower leg: No edema.     Left lower leg: No edema.  Lymphadenopathy:     Cervical: No cervical adenopathy.  Skin:    General: Skin is warm and dry.     Capillary Refill: Capillary refill takes less than 2 seconds.  Neurological:     General: No focal deficit present.     Mental Status: She is alert and oriented to person, place, and time.     Cranial Nerves: No cranial nerve deficit.     Sensory: No sensory deficit.     Motor: No weakness.     Coordination: Coordination normal.     Gait: Gait normal.     Deep Tendon Reflexes: Reflexes normal.  Psychiatric:        Mood and Affect: Mood normal.        Behavior: Behavior normal.        Thought Content: Thought content normal.        Judgment: Judgment normal.    BP (!) 124/93    Pulse 78    Temp 97.7 F (36.5 C)    Ht 4\' 11"  (1.499 m)    Wt 186 lb 0.4 oz (84.4 kg)    LMP 12/07/2021 (Exact Date)    SpO2 99%    BMI 37.57 kg/m  Wt Readings from Last 3 Encounters:  12/29/21 186 lb 0.4 oz (84.4 kg)  12/18/21 185 lb (83.9 kg)  10/24/21 193 lb (87.5 kg)     Immunization History  Administered Date(s) Administered   Influenza,inj,Quad PF,6+ Mos 07/13/2016, 10/26/2021   Pfizer Covid-19 Vaccine Bivalent Booster 28yrs & up 10/26/2021   Pneumococcal Polysaccharide-23 10/11/2016   Tdap 10/11/2008, 07/13/2016, 08/10/2021   Unspecified SARS-COV-2 Vaccination 11/19/2021    Diabetic Foot Exam - Simple   No data filed     Lab Results  Component Value Date   TSH 1.04 04/11/2016   Lab Results  Component Value Date   WBC 7.1 10/24/2021   HGB 13.7 10/24/2021   HCT 39.4 10/24/2021   MCV 92.5 10/24/2021   PLT 163 10/24/2021   Lab Results  Component Value Date   NA 137 04/11/2016   K 4.2 04/11/2016   CO2 22 04/11/2016   GLUCOSE 117 (H) 04/11/2016   BUN 10 04/11/2016   CREATININE 0.63 04/11/2016   BILITOT 0.3 04/11/2016   ALKPHOS 49 04/11/2016   AST 17 04/11/2016   ALT 15 04/11/2016   PROT 6.7 04/11/2016   ALBUMIN 3.7 04/11/2016   CALCIUM 8.8 04/11/2016   ANIONGAP 5 05/17/2015   No results found for: CHOL No results found for: HDL No results found for: LDLCALC No results found for: TRIG No results found for: CHOLHDL Lab Results  Component Value Date   HGBA1C 5.6 12/29/2021   HGBA1C 5.6 12/29/2021   HGBA1C 5.6 (A) 12/29/2021   HGBA1C 5.6 12/29/2021       Assessment & Plan:   Problem List Items Addressed This Visit   None Visit Diagnoses     Vaginal discharge    -  Primary   Relevant Orders   NuSwab Vaginitis Plus (VG+), self swab completed  Discussed possible causes Treatment deferred pending  study results   Health care maintenance       Relevant Orders   POCT glycosylated hemoglobin (Hb A1C) (Completed):5.6, prediabetic   POCT URINALYSIS DIP (CLINITEK) (Completed)   Encounter for wellness examination in adult       Relevant Orders   POCT URINALYSIS DIP (CLINITEK) (Completed)   CBC with Differential/Platelet   Comprehensive metabolic panel   Lipid panel Encouraged continued diet and exercise efforts     Follow up in 6 mths for wellness visit, sooner as needed     I am having Mickel Baas Melchor Miralrio maintain her acetaminophen, Prenatal Vit-Fe Fumarate-FA (PRENATAL VITAMINS PO), terconazole, and ibuprofen.  No orders of the defined types were placed in this encounter.    Teena Dunk, NP

## 2021-12-29 NOTE — Patient Instructions (Signed)
You were seen today in the [facility] to establish care and for vaginal symptoms. Labs were collected, results will be available via MyChart or, if abnormal, you will be contacted by clinic staff. Please follow up in 6 mths for wellness visit. ?

## 2021-12-30 LAB — CBC WITH DIFFERENTIAL/PLATELET
Basophils Absolute: 0 10*3/uL (ref 0.0–0.2)
Basos: 1 %
EOS (ABSOLUTE): 0.1 10*3/uL (ref 0.0–0.4)
Eos: 2 %
Hematocrit: 41.3 % (ref 34.0–46.6)
Hemoglobin: 14 g/dL (ref 11.1–15.9)
Immature Grans (Abs): 0 10*3/uL (ref 0.0–0.1)
Immature Granulocytes: 0 %
Lymphocytes Absolute: 2.2 10*3/uL (ref 0.7–3.1)
Lymphs: 33 %
MCH: 31.8 pg (ref 26.6–33.0)
MCHC: 33.9 g/dL (ref 31.5–35.7)
MCV: 94 fL (ref 79–97)
Monocytes Absolute: 0.4 10*3/uL (ref 0.1–0.9)
Monocytes: 5 %
Neutrophils Absolute: 3.9 10*3/uL (ref 1.4–7.0)
Neutrophils: 59 %
Platelets: 229 10*3/uL (ref 150–450)
RBC: 4.4 x10E6/uL (ref 3.77–5.28)
RDW: 13 % (ref 11.7–15.4)
WBC: 6.6 10*3/uL (ref 3.4–10.8)

## 2021-12-30 LAB — COMPREHENSIVE METABOLIC PANEL
ALT: 21 IU/L (ref 0–32)
AST: 23 IU/L (ref 0–40)
Albumin/Globulin Ratio: 2 (ref 1.2–2.2)
Albumin: 4.4 g/dL (ref 3.8–4.8)
Alkaline Phosphatase: 104 IU/L (ref 44–121)
BUN/Creatinine Ratio: 11 (ref 9–23)
BUN: 8 mg/dL (ref 6–20)
Bilirubin Total: 0.3 mg/dL (ref 0.0–1.2)
CO2: 20 mmol/L (ref 20–29)
Calcium: 8.8 mg/dL (ref 8.7–10.2)
Chloride: 112 mmol/L — ABNORMAL HIGH (ref 96–106)
Creatinine, Ser: 0.71 mg/dL (ref 0.57–1.00)
Globulin, Total: 2.2 g/dL (ref 1.5–4.5)
Glucose: 78 mg/dL (ref 70–99)
Potassium: 4.3 mmol/L (ref 3.5–5.2)
Sodium: 149 mmol/L — ABNORMAL HIGH (ref 134–144)
Total Protein: 6.6 g/dL (ref 6.0–8.5)
eGFR: 112 mL/min/{1.73_m2} (ref >59)

## 2021-12-30 LAB — LIPID PANEL
Chol/HDL Ratio: 5.8 ratio — ABNORMAL HIGH (ref 0.0–4.4)
Cholesterol, Total: 209 mg/dL — ABNORMAL HIGH (ref 100–199)
HDL: 36 mg/dL — ABNORMAL LOW (ref >39)
LDL Chol Calc (NIH): 131 mg/dL — ABNORMAL HIGH (ref 0–99)
Triglycerides: 237 mg/dL — ABNORMAL HIGH (ref 0–149)
VLDL Cholesterol Cal: 42 mg/dL — ABNORMAL HIGH (ref 5–40)

## 2022-01-02 ENCOUNTER — Other Ambulatory Visit: Payer: Self-pay | Admitting: Nurse Practitioner

## 2022-01-02 ENCOUNTER — Other Ambulatory Visit: Payer: Self-pay

## 2022-01-02 DIAGNOSIS — E782 Mixed hyperlipidemia: Secondary | ICD-10-CM

## 2022-01-02 LAB — NUSWAB VAGINITIS PLUS (VG+)
Candida albicans, NAA: NEGATIVE
Candida glabrata, NAA: NEGATIVE
Chlamydia trachomatis, NAA: NEGATIVE
Neisseria gonorrhoeae, NAA: NEGATIVE
Trich vag by NAA: NEGATIVE

## 2022-01-02 MED ORDER — ATORVASTATIN CALCIUM 40 MG PO TABS
40.0000 mg | ORAL_TABLET | Freq: Every day | ORAL | 3 refills | Status: AC
  Filled 2022-01-02: qty 90, 90d supply, fill #0
  Filled 2022-03-28: qty 90, 90d supply, fill #1

## 2022-01-02 MED ORDER — OMEGA-3-ACID ETHYL ESTERS 1 G PO CAPS
2.0000 g | ORAL_CAPSULE | Freq: Every day | ORAL | 2 refills | Status: AC
  Filled 2022-01-02: qty 60, 30d supply, fill #0
  Filled 2022-01-31: qty 60, 30d supply, fill #1
  Filled 2022-03-28: qty 60, 30d supply, fill #2

## 2022-01-31 ENCOUNTER — Other Ambulatory Visit: Payer: Self-pay

## 2022-02-20 ENCOUNTER — Ambulatory Visit: Payer: Self-pay

## 2022-03-28 ENCOUNTER — Other Ambulatory Visit: Payer: Self-pay

## 2022-07-03 ENCOUNTER — Ambulatory Visit: Payer: Self-pay | Admitting: Nurse Practitioner

## 2022-07-05 ENCOUNTER — Ambulatory Visit: Payer: Self-pay | Admitting: Nurse Practitioner

## 2024-06-09 ENCOUNTER — Other Ambulatory Visit: Payer: Self-pay | Admitting: Obstetrics & Gynecology

## 2024-06-09 DIAGNOSIS — Z1231 Encounter for screening mammogram for malignant neoplasm of breast: Secondary | ICD-10-CM

## 2024-07-02 ENCOUNTER — Ambulatory Visit
Admission: RE | Admit: 2024-07-02 | Discharge: 2024-07-02 | Disposition: A | Discharge status: Home / Self Care | Payer: Self-pay | Source: Ambulatory Visit | Attending: Obstetrics & Gynecology | Admitting: Obstetrics & Gynecology

## 2024-07-02 DIAGNOSIS — Z1231 Encounter for screening mammogram for malignant neoplasm of breast: Secondary | ICD-10-CM

## 2024-07-08 ENCOUNTER — Ambulatory Visit: Payer: Self-pay | Admitting: Obstetrics & Gynecology
